# Patient Record
Sex: Male | Born: 1980 | Race: White | Hispanic: No | Marital: Single | Smoking: Former smoker
Health system: Southern US, Community
[De-identification: ages and names within clinical notes are randomized; demographics above are authoritative.]

## PROBLEM LIST (undated history)

## (undated) DIAGNOSIS — F1010 Alcohol abuse, uncomplicated: Secondary | ICD-10-CM

## (undated) HISTORY — PX: BACK SURGERY: SHX140

---

## 2002-03-05 ENCOUNTER — Encounter: Admission: RE | Admit: 2002-03-05 | Discharge: 2002-03-05 | Payer: Self-pay | Admitting: Neurosurgery

## 2002-03-05 ENCOUNTER — Encounter: Payer: Self-pay | Admitting: Neurosurgery

## 2002-03-19 ENCOUNTER — Encounter: Payer: Self-pay | Admitting: Neurosurgery

## 2002-03-19 ENCOUNTER — Encounter: Admission: RE | Admit: 2002-03-19 | Discharge: 2002-03-19 | Payer: Self-pay | Admitting: Neurosurgery

## 2002-04-02 ENCOUNTER — Encounter: Payer: Self-pay | Admitting: Neurosurgery

## 2002-04-02 ENCOUNTER — Encounter: Admission: RE | Admit: 2002-04-02 | Discharge: 2002-04-02 | Payer: Self-pay | Admitting: Neurosurgery

## 2002-05-15 ENCOUNTER — Encounter: Payer: Self-pay | Admitting: Neurosurgery

## 2002-05-17 ENCOUNTER — Inpatient Hospital Stay (HOSPITAL_COMMUNITY): Admission: RE | Admit: 2002-05-17 | Discharge: 2002-05-18 | Payer: Self-pay | Admitting: Neurosurgery

## 2002-05-17 ENCOUNTER — Encounter: Payer: Self-pay | Admitting: Neurosurgery

## 2008-05-29 ENCOUNTER — Encounter: Payer: Self-pay | Admitting: Internal Medicine

## 2008-06-10 ENCOUNTER — Ambulatory Visit: Payer: Self-pay | Admitting: Internal Medicine

## 2008-06-10 DIAGNOSIS — R0989 Other specified symptoms and signs involving the circulatory and respiratory systems: Secondary | ICD-10-CM

## 2008-06-10 DIAGNOSIS — R0609 Other forms of dyspnea: Secondary | ICD-10-CM

## 2008-06-12 LAB — CONVERTED CEMR LAB
Basophils Absolute: 0 10*3/uL (ref 0.0–0.1)
Basophils Relative: 0.3 % (ref 0.0–1.0)
Eosinophils Absolute: 0.2 10*3/uL (ref 0.0–0.7)
Eosinophils Relative: 2 % (ref 0.0–5.0)
HCT: 46 % (ref 39.0–52.0)
Hemoglobin: 16.4 g/dL (ref 13.0–17.0)
Lymphocytes Relative: 26.9 % (ref 12.0–46.0)
MCHC: 35.6 g/dL (ref 30.0–36.0)
MCV: 96.7 fL (ref 78.0–100.0)
Monocytes Absolute: 0.7 10*3/uL (ref 0.1–1.0)
Monocytes Relative: 7.6 % (ref 3.0–12.0)
Neutro Abs: 6 10*3/uL (ref 1.4–7.7)
Neutrophils Relative %: 63.2 % (ref 43.0–77.0)
Platelets: 210 10*3/uL (ref 150–400)
RBC: 4.76 M/uL (ref 4.22–5.81)
RDW: 12.5 % (ref 11.5–14.6)
TSH: 1.88 microintl units/mL (ref 0.35–5.50)
WBC: 9.5 10*3/uL (ref 4.5–10.5)

## 2008-06-19 ENCOUNTER — Encounter (INDEPENDENT_AMBULATORY_CARE_PROVIDER_SITE_OTHER): Payer: Self-pay | Admitting: *Deleted

## 2011-05-13 NOTE — H&P (Signed)
[place]  Patient:    Harold Howard, Harold Howard Visit Number: [id number] MRN: [id number]          Service Type: SUR Location: [id number] [id number] Attending Physician:  Emeterio Reeve Dictated by:   Payton Doughty, M.D. Admit Date:  05/17/2002 Discharge Date: 05/18/2002                           History and Physical  ADMITTING DIAGNOSIS: Herniated disk, L5-S1.  HISTORY OF PRESENT ILLNESS: This is a 30 year old right-handed white gentleman, who had a disk a couple of years ago eccentric to the right at 5-1 and did not get anything done.  While mixing cement he had increased back pain.  He had degenerative disk disease, central disk at 4-5 and right-sided disk at 5-1.  He complains of back and right leg pain.  He underwent epidural steroids, which did not help him much.  Restudying him he has a central disk at 4-5, slightly eccentric to the right, but at L5-S1 he has a rather prominent disk that elevates the right S1 nerve root, and he is now admitted for 5-1 diskectomy.  PAST MEDICAL HISTORY: Otherwise unremarkable.  MEDICATIONS: He does not use any medication.  ALLERGIES: He has no allergies.  PAST SURGICAL HISTORY: No operations.  FAMILY HISTORY: Mom is 88 and in good health.  Dad is 69 and in good health.  SOCIAL HISTORY: He does not smoke or drink.  He is a Engineer, production.  REVIEW OF SYSTEMS: Remarkable for back and right leg pain.  PHYSICAL EXAMINATION:  HEENT: Within normal limits.  NECK: He has reasonable range of motion of the neck.  CHEST: Clear.  CARDIAC: Regular rate and rhythm.  ABDOMEN: Nontender.  No hepatosplenomegaly.  EXTREMITIES: Without clubbing or cyanosis.  Peripheral pulses good.  GU: Examination deferred.  NEUROLOGIC: He is awake, alert, and oriented.  Cranial nerves intact.  Motor examination shows 5/5 strength throughout the upper and lower extremities except for some mild dorsiflexion weakness on the right  side.  He has a very strongly positive straight-leg-raise on the right side.  Reflexes are 2 at the knees, 2 at the left ankle, absent at the right ankle.  LABORATORY DATA: He also had discography which during which the right S1 transforaminal injection got a strong concordant response.  CLINICAL IMPRESSION: Right S1 radiculopathy secondary to herniated disk. Although he does have a disk at 4-5 I believe his symptomatic disk is at 5-1.  PLAN: The plan is for lumbar laminectomy and diskectomy at L5-S1.  The risks and benefits of this approach have been discussed with him and he wishes to proceed. Dictated by:   Payton Doughty, M.D. Attending Physician:  Emeterio Reeve DD:  05/17/02 TD:  05/19/02 Job: [id number]

## 2011-05-13 NOTE — Op Note (Signed)
[place]  Patient:    Harold, Howard Visit Number: [id number] MRN: [id number]          Service Type: SUR Location: 3000 3025 01 Attending Physician:  Emeterio Reeve Proc. Date: 05/17/02 Admit Date:  05/17/2002 Discharge Date: 05/18/2002                             Operative Report  PREOPERATIVE DIAGNOSIS:  Herniated disk on the right L5-S1.  POSTOPERATIVE DIAGNOSIS:  Herniated on the right side L5-S1.  PROCEDURE PERFORMED:  Right L5-S1 laminectomy and diskectomy.  SURGEON:  Payton Doughty, M.D.  ASSISTANTLandry Mellow.  ANESTHESIA:  General endotracheal.  PREP:  Sterile prep with alcohol wipe.  COMPLICATIONS:  None.  NURSE ASSISTANT:  [place].  INDICATION:  This is a 30 year old right-handed gentleman with a right S1 radiculopathy and herniated disk at L5-S1.  DESCRIPTION OF PROCEDURE:  He is taken to the operating room and smoothly intubated.  He was placed prone on the operating table.  He was prepped and draped in the usual sterile fashion.  The skin was infiltrated with 1% lidocaine with 1:400,000 epinephrine.  The skin was incised from mid L4 to mid S1.  The lumen of L4 and L5 were exposed on the right side in a periosteal plane.  Intraoperative x-ray showed a marker at L4.  A laminectomy was carried out.  Hemilaminectomy of L5 was carried out at the top of the ligamentum flavum that was removed in a retrograde fashion.  The right S1 nerve root was identified and carefully dissected free and immediately entered, was identified a large herniated disk which as a free fragment was removed without difficulty.  The annular tear was then carefully explored and all graspable fragments were removed.  The wound was irrigated and hemostasis assured.  The nerve root was carefully explored in all quadrants to make sure it was free. Depo-Medrol soaked fat was then placed in the laminectomy defect.  The fascia was reapproximated with 0  Vicryl in an interrupted fashion.  The subcutaneous tissue was reapproximated with 0 Vicryl in an interrupted fashion and the subcuticular tissues were approximated with 3-0 Vicryl in an interrupted fashion.  The skin was closed with 4-0 Vicryl in a running subcuticular fashion.  Benzoin and Steri-Strips were placed made occlusive with Telfa and Op-Site.  The patient was returned to the recovery room in good condition. Attending Physician:  Emeterio Reeve DD:  05/17/02 TD:  05/21/02 Job: [id number] [id number]

## 2018-11-15 ENCOUNTER — Encounter (HOSPITAL_COMMUNITY): Payer: Self-pay | Admitting: Emergency Medicine

## 2018-11-15 ENCOUNTER — Emergency Department (HOSPITAL_COMMUNITY): Payer: 59

## 2018-11-15 ENCOUNTER — Emergency Department (HOSPITAL_COMMUNITY)
Admission: EM | Admit: 2018-11-15 | Discharge: 2018-11-15 | Disposition: A | Discharge status: Home / Self Care | Payer: 59 | Attending: Emergency Medicine | Admitting: Emergency Medicine

## 2018-11-15 ENCOUNTER — Other Ambulatory Visit: Payer: Self-pay

## 2018-11-15 DIAGNOSIS — Y939 Activity, unspecified: Secondary | ICD-10-CM | POA: Insufficient documentation

## 2018-11-15 DIAGNOSIS — Y929 Unspecified place or not applicable: Secondary | ICD-10-CM | POA: Insufficient documentation

## 2018-11-15 DIAGNOSIS — S99921A Unspecified injury of right foot, initial encounter: Secondary | ICD-10-CM | POA: Diagnosis present

## 2018-11-15 DIAGNOSIS — S9031XA Contusion of right foot, initial encounter: Secondary | ICD-10-CM | POA: Insufficient documentation

## 2018-11-15 DIAGNOSIS — W228XXA Striking against or struck by other objects, initial encounter: Secondary | ICD-10-CM | POA: Insufficient documentation

## 2018-11-15 DIAGNOSIS — Y998 Other external cause status: Secondary | ICD-10-CM | POA: Diagnosis not present

## 2018-11-15 NOTE — ED Triage Notes (Signed)
Pt from home with c/o pain in right foot following falling after kicking a chair and falling. Pt buddy taped affected toes at home. Pt reports pain 4/10. Pt also wants a toe on his left foot assessed. He states "the toe has been dislocated since 2010"

## 2018-11-15 NOTE — ED Provider Notes (Addendum)
[facility]-EMERGENCY DEPT Provider Note   CSN: [id number] Arrival date & time: 11/15/18  2207     History   Chief Complaint Chief Complaint  Patient presents with  . Foot Pain    HPI Harold Howard is a 37 y.o. male.  HPI  37 year old male complaining of right foot pain since kicking a chair 2 nights ago.  He states that there is some swelling and bruising on the great toe and the second toe.  Pain has been moderate.  Pain is improved by elevating it but not walking on it.  Pain is worsened with walking on it.  History reviewed. No pertinent past medical history.  Patient Active Problem List   Diagnosis Date Noted  . DYSPNEA 06/10/2008    History reviewed. No pertinent surgical history.      Home Medications    Prior to Admission medications   Not on File    Family History No family history on file.  Social History Social History   Tobacco Use  . Smoking status: Not on file  Substance Use Topics  . Alcohol use: Not on file  . Drug use: Not on file     Allergies   Patient has no known allergies.   Review of Systems Review of Systems  All other systems reviewed and are negative.    Physical Exam Updated Vital Signs BP (!) 150/99 (BP Location: Left Arm)   Pulse 88   Temp 97.9 F (36.6 C) (Oral)   Resp 12   Ht 1.778 m (5\' 10" )   Wt 74.1 kg   SpO2 100%   BMI 23.45 kg/m   Physical Exam  Constitutional: He is oriented to person, place, and time. He appears well-developed and well-nourished.  HENT:  Head: Normocephalic and atraumatic.  Right Ear: External ear normal.  Left Ear: External ear normal.  Nose: Nose normal.  Eyes: EOM are normal.  Neck: No tracheal deviation present.  Pulmonary/Chest: Effort normal.  Musculoskeletal:       Feet:  Some swelling and tenderness over right great toe and second toe.  Neurological: He is alert and oriented to person, place, and time.  Skin: Skin is warm and dry.    Psychiatric: He has a normal mood and affect. His behavior is normal.  Nursing note and vitals reviewed.    ED Treatments / Results  Labs (all labs ordered are listed, but only abnormal results are displayed) Labs Reviewed - No data to display  EKG None  Radiology Dg Foot Complete Right  Result Date: 11/15/2018 CLINICAL DATA:  Patient kicked a chair on 11 [id number] injuring the foot and bruising the medial side and over the first and second metatarsals. EXAM: RIGHT FOOT COMPLETE - 3+ VIEW COMPARISON:  None. FINDINGS: There is 25 degrees of hallux valgus of the great toe. No acute fracture or joint dislocation is identified. Tiny plantar and dorsal calcaneal enthesophytes are present. No significant soft tissue swelling. No radiopaque foreign body. IMPRESSION: 1. No acute fracture or joint dislocation. 2. Hallux valgus. 3. No significant soft tissue swelling. Electronically Signed   By: Tollie Eth M.D.   On: 11/15/2018 23:17    Procedures Procedures (including critical care time)  Medications Ordered in ED Medications - No data to display   Initial Impression / Assessment and Plan / ED Course  I have reviewed the triage vital signs and the nursing notes.  Pertinent labs & imaging results that were available during my care of  the patient were reviewed by me and considered in my medical decision making (see chart for details).      Final Clinical Impressions(s) / ED Diagnoses   Final diagnoses:  Contusion of right foot, initial encounter    ED Discharge Orders    None       Margarita Grizzleay, Suleika Donavan, MD 11/15/18 [id number]    Margarita Grizzleay, Kaleb Sek, MD 11/15/18 2328

## 2018-11-15 NOTE — Discharge Instructions (Addendum)
Continue to use boot Elevate foot and use cold therapy as much as possible

## 2018-11-15 NOTE — ED Notes (Signed)
Patient transported to X-ray 

## 2022-02-13 ENCOUNTER — Inpatient Hospital Stay (HOSPITAL_COMMUNITY): Payer: Self-pay

## 2022-02-13 ENCOUNTER — Emergency Department (HOSPITAL_COMMUNITY): Payer: Self-pay

## 2022-02-13 ENCOUNTER — Encounter (HOSPITAL_COMMUNITY): Payer: Self-pay | Admitting: Anesthesiology

## 2022-02-13 ENCOUNTER — Other Ambulatory Visit: Payer: Self-pay

## 2022-02-13 ENCOUNTER — Encounter (HOSPITAL_COMMUNITY): Payer: Self-pay

## 2022-02-13 ENCOUNTER — Inpatient Hospital Stay (HOSPITAL_COMMUNITY)
Admission: EM | Admit: 2022-02-13 | Discharge: 2022-03-26 | DRG: 405 | Disposition: E | Payer: Self-pay | Attending: Internal Medicine | Admitting: Internal Medicine

## 2022-02-13 ENCOUNTER — Encounter (HOSPITAL_COMMUNITY): Admission: EM | Disposition: E | Payer: Self-pay | Source: Home / Self Care | Attending: Internal Medicine

## 2022-02-13 DIAGNOSIS — F419 Anxiety disorder, unspecified: Secondary | ICD-10-CM | POA: Diagnosis present

## 2022-02-13 DIAGNOSIS — J9601 Acute respiratory failure with hypoxia: Secondary | ICD-10-CM | POA: Diagnosis present

## 2022-02-13 DIAGNOSIS — I81 Portal vein thrombosis: Secondary | ICD-10-CM | POA: Diagnosis present

## 2022-02-13 DIAGNOSIS — Z978 Presence of other specified devices: Secondary | ICD-10-CM

## 2022-02-13 DIAGNOSIS — F1010 Alcohol abuse, uncomplicated: Secondary | ICD-10-CM

## 2022-02-13 DIAGNOSIS — E8809 Other disorders of plasma-protein metabolism, not elsewhere classified: Secondary | ICD-10-CM | POA: Diagnosis present

## 2022-02-13 DIAGNOSIS — Z6372 Alcoholism and drug addiction in family: Secondary | ICD-10-CM

## 2022-02-13 DIAGNOSIS — I8501 Esophageal varices with bleeding: Secondary | ICD-10-CM

## 2022-02-13 DIAGNOSIS — E8770 Fluid overload, unspecified: Secondary | ICD-10-CM | POA: Diagnosis not present

## 2022-02-13 DIAGNOSIS — R569 Unspecified convulsions: Secondary | ICD-10-CM | POA: Diagnosis present

## 2022-02-13 DIAGNOSIS — Z87891 Personal history of nicotine dependence: Secondary | ICD-10-CM

## 2022-02-13 DIAGNOSIS — R195 Other fecal abnormalities: Secondary | ICD-10-CM | POA: Diagnosis present

## 2022-02-13 DIAGNOSIS — F05 Delirium due to known physiological condition: Secondary | ICD-10-CM | POA: Diagnosis not present

## 2022-02-13 DIAGNOSIS — R41843 Psychomotor deficit: Secondary | ICD-10-CM | POA: Diagnosis present

## 2022-02-13 DIAGNOSIS — K7040 Alcoholic hepatic failure without coma: Secondary | ICD-10-CM | POA: Diagnosis present

## 2022-02-13 DIAGNOSIS — R188 Other ascites: Secondary | ICD-10-CM

## 2022-02-13 DIAGNOSIS — Z452 Encounter for adjustment and management of vascular access device: Secondary | ICD-10-CM

## 2022-02-13 DIAGNOSIS — E870 Hyperosmolality and hypernatremia: Secondary | ICD-10-CM | POA: Diagnosis not present

## 2022-02-13 DIAGNOSIS — E871 Hypo-osmolality and hyponatremia: Secondary | ICD-10-CM | POA: Diagnosis not present

## 2022-02-13 DIAGNOSIS — J690 Pneumonitis due to inhalation of food and vomit: Secondary | ICD-10-CM | POA: Diagnosis not present

## 2022-02-13 DIAGNOSIS — Z66 Do not resuscitate: Secondary | ICD-10-CM | POA: Diagnosis not present

## 2022-02-13 DIAGNOSIS — D62 Acute posthemorrhagic anemia: Secondary | ICD-10-CM | POA: Diagnosis present

## 2022-02-13 DIAGNOSIS — F10239 Alcohol dependence with withdrawal, unspecified: Secondary | ICD-10-CM | POA: Diagnosis present

## 2022-02-13 DIAGNOSIS — K7031 Alcoholic cirrhosis of liver with ascites: Principal | ICD-10-CM

## 2022-02-13 DIAGNOSIS — F32A Depression, unspecified: Secondary | ICD-10-CM | POA: Diagnosis present

## 2022-02-13 DIAGNOSIS — Z634 Disappearance and death of family member: Secondary | ICD-10-CM

## 2022-02-13 DIAGNOSIS — K7682 Hepatic encephalopathy: Secondary | ICD-10-CM | POA: Diagnosis present

## 2022-02-13 DIAGNOSIS — K7011 Alcoholic hepatitis with ascites: Secondary | ICD-10-CM | POA: Diagnosis present

## 2022-02-13 DIAGNOSIS — E440 Moderate protein-calorie malnutrition: Secondary | ICD-10-CM | POA: Insufficient documentation

## 2022-02-13 DIAGNOSIS — K2100 Gastro-esophageal reflux disease with esophagitis, without bleeding: Secondary | ICD-10-CM | POA: Diagnosis present

## 2022-02-13 DIAGNOSIS — J95851 Ventilator associated pneumonia: Secondary | ICD-10-CM | POA: Diagnosis not present

## 2022-02-13 DIAGNOSIS — Z811 Family history of alcohol abuse and dependence: Secondary | ICD-10-CM

## 2022-02-13 DIAGNOSIS — Z515 Encounter for palliative care: Secondary | ICD-10-CM

## 2022-02-13 DIAGNOSIS — I8511 Secondary esophageal varices with bleeding: Secondary | ICD-10-CM | POA: Diagnosis present

## 2022-02-13 DIAGNOSIS — R578 Other shock: Secondary | ICD-10-CM | POA: Diagnosis present

## 2022-02-13 DIAGNOSIS — R9431 Abnormal electrocardiogram [ECG] [EKG]: Secondary | ICD-10-CM | POA: Diagnosis present

## 2022-02-13 DIAGNOSIS — Z9911 Dependence on respirator [ventilator] status: Secondary | ICD-10-CM

## 2022-02-13 DIAGNOSIS — K3189 Other diseases of stomach and duodenum: Secondary | ICD-10-CM | POA: Diagnosis present

## 2022-02-13 DIAGNOSIS — E876 Hypokalemia: Secondary | ICD-10-CM | POA: Diagnosis present

## 2022-02-13 DIAGNOSIS — K7460 Unspecified cirrhosis of liver: Secondary | ICD-10-CM

## 2022-02-13 DIAGNOSIS — K766 Portal hypertension: Secondary | ICD-10-CM | POA: Diagnosis present

## 2022-02-13 DIAGNOSIS — Z6826 Body mass index (BMI) 26.0-26.9, adult: Secondary | ICD-10-CM

## 2022-02-13 DIAGNOSIS — Z931 Gastrostomy status: Secondary | ICD-10-CM

## 2022-02-13 DIAGNOSIS — T82528A Displacement of other cardiac and vascular devices and implants, initial encounter: Secondary | ICD-10-CM

## 2022-02-13 DIAGNOSIS — Y848 Other medical procedures as the cause of abnormal reaction of the patient, or of later complication, without mention of misadventure at the time of the procedure: Secondary | ICD-10-CM | POA: Diagnosis not present

## 2022-02-13 DIAGNOSIS — D696 Thrombocytopenia, unspecified: Secondary | ICD-10-CM | POA: Diagnosis present

## 2022-02-13 DIAGNOSIS — E875 Hyperkalemia: Secondary | ICD-10-CM | POA: Diagnosis not present

## 2022-02-13 DIAGNOSIS — N5089 Other specified disorders of the male genital organs: Secondary | ICD-10-CM | POA: Diagnosis present

## 2022-02-13 DIAGNOSIS — Z800 Family history of malignant neoplasm of digestive organs: Secondary | ICD-10-CM

## 2022-02-13 DIAGNOSIS — E872 Acidosis, unspecified: Secondary | ICD-10-CM | POA: Diagnosis present

## 2022-02-13 DIAGNOSIS — J9690 Respiratory failure, unspecified, unspecified whether with hypoxia or hypercapnia: Secondary | ICD-10-CM

## 2022-02-13 DIAGNOSIS — R600 Localized edema: Secondary | ICD-10-CM | POA: Diagnosis present

## 2022-02-13 DIAGNOSIS — Z638 Other specified problems related to primary support group: Secondary | ICD-10-CM

## 2022-02-13 DIAGNOSIS — R571 Hypovolemic shock: Secondary | ICD-10-CM

## 2022-02-13 DIAGNOSIS — K922 Gastrointestinal hemorrhage, unspecified: Secondary | ICD-10-CM

## 2022-02-13 DIAGNOSIS — N179 Acute kidney failure, unspecified: Secondary | ICD-10-CM | POA: Diagnosis present

## 2022-02-13 DIAGNOSIS — G9341 Metabolic encephalopathy: Secondary | ICD-10-CM | POA: Diagnosis present

## 2022-02-13 DIAGNOSIS — F1020 Alcohol dependence, uncomplicated: Secondary | ICD-10-CM | POA: Diagnosis present

## 2022-02-13 DIAGNOSIS — D684 Acquired coagulation factor deficiency: Secondary | ICD-10-CM | POA: Diagnosis present

## 2022-02-13 DIAGNOSIS — Z20822 Contact with and (suspected) exposure to covid-19: Secondary | ICD-10-CM | POA: Diagnosis present

## 2022-02-13 HISTORY — PX: ESOPHAGOGASTRODUODENOSCOPY: SHX5428

## 2022-02-13 HISTORY — PX: ESOPHAGEAL BANDING: SHX5518

## 2022-02-13 HISTORY — DX: Alcohol abuse, uncomplicated: F10.10

## 2022-02-13 LAB — I-STAT ARTERIAL BLOOD GAS, ED
Acid-base deficit: 22 mmol/L — ABNORMAL HIGH (ref 0.0–2.0)
Bicarbonate: 9.2 mmol/L — ABNORMAL LOW (ref 20.0–28.0)
Calcium, Ion: 0.99 mmol/L — ABNORMAL LOW (ref 1.15–1.40)
HCT: 30 % — ABNORMAL LOW (ref 39.0–52.0)
Hemoglobin: 10.2 g/dL — ABNORMAL LOW (ref 13.0–17.0)
O2 Saturation: 100 %
Patient temperature: 96.7
Potassium: 3.9 mmol/L (ref 3.5–5.1)
Sodium: 137 mmol/L (ref 135–145)
TCO2: 10 mmol/L — ABNORMAL LOW (ref 22–32)
pCO2 arterial: 37.9 mmHg (ref 32–48)
pH, Arterial: 6.985 — CL (ref 7.35–7.45)
pO2, Arterial: 461 mmHg — ABNORMAL HIGH (ref 83–108)

## 2022-02-13 LAB — CBC
HCT: 9.1 % — ABNORMAL LOW (ref 39.0–52.0)
HCT: 24.3 % — ABNORMAL LOW (ref 39.0–52.0)
HCT: 23.6 % — ABNORMAL LOW (ref 39.0–52.0)
Hemoglobin: 1.9 g/dL — CL (ref 13.0–17.0)
Hemoglobin: 8.2 g/dL — ABNORMAL LOW (ref 13.0–17.0)
Hemoglobin: 7.9 g/dL — ABNORMAL LOW (ref 13.0–17.0)
MCH: 22.9 pg — ABNORMAL LOW (ref 26.0–34.0)
MCH: 29.4 pg (ref 26.0–34.0)
MCH: 28.7 pg (ref 26.0–34.0)
MCHC: 20.9 g/dL — ABNORMAL LOW (ref 30.0–36.0)
MCHC: 33.7 g/dL (ref 30.0–36.0)
MCHC: 33.5 g/dL (ref 30.0–36.0)
MCV: 109.6 fL — ABNORMAL HIGH (ref 80.0–100.0)
MCV: 87.1 fL (ref 80.0–100.0)
MCV: 85.8 fL (ref 80.0–100.0)
Platelets: 143 10*3/uL — ABNORMAL LOW (ref 150–400)
Platelets: UNDETERMINED 10*3/uL (ref 150–400)
Platelets: 66 10*3/uL — ABNORMAL LOW (ref 150–400)
RBC: 0.83 MIL/uL — ABNORMAL LOW (ref 4.22–5.81)
RBC: 2.79 MIL/uL — ABNORMAL LOW (ref 4.22–5.81)
RBC: 2.75 MIL/uL — ABNORMAL LOW (ref 4.22–5.81)
RDW: 27.6 % — ABNORMAL HIGH (ref 11.5–15.5)
RDW: 15.8 % — ABNORMAL HIGH (ref 11.5–15.5)
RDW: 15.8 % — ABNORMAL HIGH (ref 11.5–15.5)
WBC: 16.7 10*3/uL — ABNORMAL HIGH (ref 4.0–10.5)
WBC: 20 10*3/uL — ABNORMAL HIGH (ref 4.0–10.5)
WBC: 18.3 10*3/uL — ABNORMAL HIGH (ref 4.0–10.5)
nRBC: 0.4 % — ABNORMAL HIGH (ref 0.0–0.2)
nRBC: 0.2 % (ref 0.0–0.2)
nRBC: 0.4 % — ABNORMAL HIGH (ref 0.0–0.2)

## 2022-02-13 LAB — POCT I-STAT 7, (LYTES, BLD GAS, ICA,H+H)
Acid-base deficit: 17 mmol/L — ABNORMAL HIGH (ref 0.0–2.0)
Acid-base deficit: 8 mmol/L — ABNORMAL HIGH (ref 0.0–2.0)
Bicarbonate: 9.1 mmol/L — ABNORMAL LOW (ref 20.0–28.0)
Bicarbonate: 14.9 mmol/L — ABNORMAL LOW (ref 20.0–28.0)
Calcium, Ion: 1.01 mmol/L — ABNORMAL LOW (ref 1.15–1.40)
Calcium, Ion: 1.05 mmol/L — ABNORMAL LOW (ref 1.15–1.40)
HCT: 28 % — ABNORMAL LOW (ref 39.0–52.0)
HCT: 23 % — ABNORMAL LOW (ref 39.0–52.0)
Hemoglobin: 9.5 g/dL — ABNORMAL LOW (ref 13.0–17.0)
Hemoglobin: 7.8 g/dL — ABNORMAL LOW (ref 13.0–17.0)
O2 Saturation: 99 %
O2 Saturation: 99 %
Patient temperature: 34.5
Patient temperature: 36.2
Potassium: 3.6 mmol/L (ref 3.5–5.1)
Potassium: 3.2 mmol/L — ABNORMAL LOW (ref 3.5–5.1)
Sodium: 137 mmol/L (ref 135–145)
Sodium: 140 mmol/L (ref 135–145)
TCO2: 10 mmol/L — ABNORMAL LOW (ref 22–32)
TCO2: 16 mmol/L — ABNORMAL LOW (ref 22–32)
pCO2 arterial: 19.9 mmHg — CL (ref 32–48)
pCO2 arterial: 21 mmHg — ABNORMAL LOW (ref 32–48)
pH, Arterial: 7.254 — ABNORMAL LOW (ref 7.35–7.45)
pH, Arterial: 7.456 — ABNORMAL HIGH (ref 7.35–7.45)
pO2, Arterial: 125 mmHg — ABNORMAL HIGH (ref 83–108)
pO2, Arterial: 115 mmHg — ABNORMAL HIGH (ref 83–108)

## 2022-02-13 LAB — URINALYSIS, ROUTINE W REFLEX MICROSCOPIC
Bilirubin Urine: NEGATIVE
Glucose, UA: NEGATIVE mg/dL
Ketones, ur: NEGATIVE mg/dL
Leukocytes,Ua: NEGATIVE
Nitrite: NEGATIVE
Protein, ur: NEGATIVE mg/dL
Specific Gravity, Urine: 1.02 (ref 1.005–1.030)
pH: 6 (ref 5.0–8.0)

## 2022-02-13 LAB — MAGNESIUM: Magnesium: 1.6 mg/dL — ABNORMAL LOW (ref 1.7–2.4)

## 2022-02-13 LAB — DIC (DISSEMINATED INTRAVASCULAR COAGULATION)PANEL
D-Dimer, Quant: 6.18 ug/mL-FEU — ABNORMAL HIGH (ref 0.00–0.50)
Fibrinogen: 174 mg/dL — ABNORMAL LOW (ref 210–475)
INR: 2 — ABNORMAL HIGH (ref 0.8–1.2)
Platelets: UNDETERMINED 10*3/uL (ref 150–400)
Prothrombin Time: 22.4 seconds — ABNORMAL HIGH (ref 11.4–15.2)
Smear Review: NONE SEEN
aPTT: 39 seconds — ABNORMAL HIGH (ref 24–36)

## 2022-02-13 LAB — COMPREHENSIVE METABOLIC PANEL
ALT: <5 U/L (ref 0–44)
AST: 86 U/L — ABNORMAL HIGH (ref 15–41)
Albumin: 1.6 g/dL — ABNORMAL LOW (ref 3.5–5.0)
Alkaline Phosphatase: 80 U/L (ref 38–126)
BUN: 29 mg/dL — ABNORMAL HIGH (ref 6–20)
CO2: <7 mmol/L — ABNORMAL LOW (ref 22–32)
Calcium: 8.2 mg/dL — ABNORMAL LOW (ref 8.9–10.3)
Chloride: 100 mmol/L (ref 98–111)
Creatinine, Ser: 1.86 mg/dL — ABNORMAL HIGH (ref 0.61–1.24)
GFR, Estimated: 46 mL/min — ABNORMAL LOW (ref >60)
Glucose, Bld: 111 mg/dL — ABNORMAL HIGH (ref 70–99)
Potassium: 4.6 mmol/L (ref 3.5–5.1)
Sodium: 136 mmol/L (ref 135–145)
Total Bilirubin: 4.1 mg/dL — ABNORMAL HIGH (ref 0.3–1.2)
Total Protein: 4.1 g/dL — ABNORMAL LOW (ref 6.5–8.1)

## 2022-02-13 LAB — ABO/RH: ABO/RH(D): O POS

## 2022-02-13 LAB — PHOSPHORUS: Phosphorus: 4.8 mg/dL — ABNORMAL HIGH (ref 2.5–4.6)

## 2022-02-13 LAB — LACTIC ACID, PLASMA
Lactic Acid, Venous: >9 mmol/L (ref 0.5–1.9)
Lactic Acid, Venous: >9 mmol/L (ref 0.5–1.9)

## 2022-02-13 LAB — URINALYSIS, MICROSCOPIC (REFLEX)

## 2022-02-13 LAB — CBG MONITORING, ED: Glucose-Capillary: 96 mg/dL (ref 70–99)

## 2022-02-13 LAB — GLUCOSE, CAPILLARY
Glucose-Capillary: 121 mg/dL — ABNORMAL HIGH (ref 70–99)
Glucose-Capillary: 127 mg/dL — ABNORMAL HIGH (ref 70–99)
Glucose-Capillary: 128 mg/dL — ABNORMAL HIGH (ref 70–99)

## 2022-02-13 LAB — HIV ANTIBODY (ROUTINE TESTING W REFLEX): HIV Screen 4th Generation wRfx: NONREACTIVE

## 2022-02-13 LAB — RESP PANEL BY RT-PCR (FLU A&B, COVID) ARPGX2
Influenza A by PCR: NEGATIVE
Influenza B by PCR: NEGATIVE
SARS Coronavirus 2 by RT PCR: NEGATIVE

## 2022-02-13 LAB — TROPONIN I (HIGH SENSITIVITY)
Troponin I (High Sensitivity): 68 ng/L — ABNORMAL HIGH (ref <18)
Troponin I (High Sensitivity): 198 ng/L (ref <18)

## 2022-02-13 LAB — LACTATE DEHYDROGENASE: LDH: 365 U/L — ABNORMAL HIGH (ref 98–192)

## 2022-02-13 LAB — HEPATITIS A ANTIBODY, TOTAL: hep A Total Ab: REACTIVE — AB

## 2022-02-13 LAB — PROTIME-INR
INR: 1.9 — ABNORMAL HIGH (ref 0.8–1.2)
Prothrombin Time: 21.9 seconds — ABNORMAL HIGH (ref 11.4–15.2)

## 2022-02-13 LAB — PREPARE RBC (CROSSMATCH)

## 2022-02-13 LAB — AMMONIA: Ammonia: 144 umol/L — ABNORMAL HIGH (ref 9–35)

## 2022-02-13 LAB — HEPATITIS C ANTIBODY: HCV Ab: NONREACTIVE

## 2022-02-13 LAB — POC OCCULT BLOOD, ED: Fecal Occult Bld: POSITIVE — AB

## 2022-02-13 LAB — MRSA NEXT GEN BY PCR, NASAL: MRSA by PCR Next Gen: NOT DETECTED

## 2022-02-13 LAB — HEPATITIS B SURFACE ANTIGEN: Hepatitis B Surface Ag: NONREACTIVE

## 2022-02-13 SURGERY — EGD (ESOPHAGOGASTRODUODENOSCOPY)
Anesthesia: Choice

## 2022-02-13 MED ORDER — SODIUM CHLORIDE 0.9 % IV SOLN
50.0000 ug/h | INTRAVENOUS | Status: AC
  Administered 2022-02-13 – 2022-02-16 (×7): 50 ug/h via INTRAVENOUS
  Filled 2022-02-13 (×8): qty 1

## 2022-02-13 MED ORDER — LORAZEPAM 2 MG/ML IJ SOLN: 2.0000 mg | INTRAMUSCULAR | Status: DC | PRN

## 2022-02-13 MED ORDER — MIDAZOLAM HCL 2 MG/2ML IJ SOLN
2.0000 mg | INTRAMUSCULAR | Status: DC | PRN
  Filled 2022-02-13 (×5): qty 2

## 2022-02-13 MED ORDER — PHENYLEPHRINE 40 MCG/ML (10ML) SYRINGE FOR IV PUSH (FOR BLOOD PRESSURE SUPPORT)
160.0000 ug | PREFILLED_SYRINGE | Freq: Once | INTRAVENOUS | Status: DC | PRN
Start: 2022-02-13 — End: 2022-02-14

## 2022-02-13 MED ORDER — CALCIUM CHLORIDE 10 % IV SOLN
INTRAVENOUS | Status: AC
  Filled 2022-02-13: qty 10

## 2022-02-13 MED ORDER — CHLORHEXIDINE GLUCONATE CLOTH 2 % EX PADS
6.0000 | MEDICATED_PAD | Freq: Every day | CUTANEOUS | Status: DC
  Administered 2022-02-15 – 2022-02-17 (×4): 6 via TOPICAL

## 2022-02-13 MED ORDER — ETOMIDATE 2 MG/ML IV SOLN
INTRAVENOUS | Status: AC | PRN
  Administered 2022-02-13: 20 mg via INTRAVENOUS

## 2022-02-13 MED ORDER — POLYETHYLENE GLYCOL 3350 17 G PO PACK: 17.0000 g | PACK | Freq: Every day | ORAL | Status: DC

## 2022-02-13 MED ORDER — FENTANYL 2500MCG IN NS 250ML (10MCG/ML) PREMIX INFUSION
50.0000 ug/h | INTRAVENOUS | Status: DC
  Administered 2022-02-13: 50 ug/h via INTRAVENOUS
  Administered 2022-02-13: 200 ug/h via INTRAVENOUS
  Administered 2022-02-14: 100 ug/h via INTRAVENOUS
  Filled 2022-02-13 (×3): qty 250

## 2022-02-13 MED ORDER — OCTREOTIDE LOAD VIA INFUSION
50.0000 ug | Freq: Once | INTRAVENOUS | Status: DC
  Filled 2022-02-13: qty 25

## 2022-02-13 MED ORDER — FOLIC ACID 1 MG PO TABS: 1.0000 mg | ORAL_TABLET | Freq: Every day | ORAL | Status: DC

## 2022-02-13 MED ORDER — CALCIUM CHLORIDE 10 % IV SOLN
INTRAVENOUS | Status: DC | PRN
Start: 2022-02-13 — End: 2022-02-14
  Administered 2022-02-13: 1 g via INTRAVENOUS

## 2022-02-13 MED ORDER — SODIUM BICARBONATE 8.4 % IV SOLN
100.0000 meq | Freq: Once | INTRAVENOUS | Status: AC
  Administered 2022-02-13: 100 meq via INTRAVENOUS
  Filled 2022-02-13: qty 100

## 2022-02-13 MED ORDER — SODIUM CHLORIDE 0.9 % IV SOLN
10.0000 mL/h | Freq: Once | INTRAVENOUS | Status: DC
Start: 2022-02-13 — End: 2022-02-14

## 2022-02-13 MED ORDER — CEFTRIAXONE SODIUM 2 G IJ SOLR
2.0000 g | INTRAMUSCULAR | Status: DC
  Administered 2022-02-13: 2 g via INTRAVENOUS
  Filled 2022-02-13 (×2): qty 20

## 2022-02-13 MED ORDER — SODIUM CHLORIDE 0.9% IV SOLUTION: Freq: Once | INTRAVENOUS | Status: DC

## 2022-02-13 MED ORDER — CALCIUM GLUCONATE-NACL 1-0.675 GM/50ML-% IV SOLN: 1.0000 g | Freq: Once | INTRAVENOUS | Status: DC

## 2022-02-13 MED ORDER — CALCIUM GLUCONATE-NACL 2-0.675 GM/100ML-% IV SOLN
2.0000 g | Freq: Once | INTRAVENOUS | Status: AC
  Administered 2022-02-13: 2000 mg via INTRAVENOUS
  Filled 2022-02-13: qty 100

## 2022-02-13 MED ORDER — ORAL CARE MOUTH RINSE
15.0000 mL | OROMUCOSAL | Status: DC
  Administered 2022-02-13 – 2022-02-16 (×27): 15 mL via OROMUCOSAL

## 2022-02-13 MED ORDER — PROPOFOL 1000 MG/100ML IV EMUL: 0.0000 ug/kg/min | INTRAVENOUS | Status: DC

## 2022-02-13 MED ORDER — PHENYLEPHRINE HCL-NACL 20-0.9 MG/250ML-% IV SOLN
INTRAVENOUS | Status: AC
  Administered 2022-02-13: 20 ug/min via INTRAVENOUS
  Filled 2022-02-13: qty 250

## 2022-02-13 MED ORDER — STERILE WATER FOR INJECTION IV SOLN
INTRAVENOUS | Status: DC
  Filled 2022-02-13: qty 1000

## 2022-02-13 MED ORDER — MIDAZOLAM HCL 2 MG/2ML IJ SOLN
2.0000 mg | INTRAMUSCULAR | Status: DC | PRN
  Administered 2022-02-13 (×3): 2 mg via INTRAVENOUS

## 2022-02-13 MED ORDER — POLYETHYLENE GLYCOL 3350 17 G PO PACK: 17.0000 g | PACK | Freq: Every day | ORAL | Status: DC | PRN

## 2022-02-13 MED ORDER — PANTOPRAZOLE SODIUM 40 MG IV SOLR: 40.0000 mg | Freq: Two times a day (BID) | INTRAVENOUS | Status: DC

## 2022-02-13 MED ORDER — TRANEXAMIC ACID-NACL 1000-0.7 MG/100ML-% IV SOLN
1000.0000 mg | INTRAVENOUS | Status: AC
  Administered 2022-02-13: 1000 mg via INTRAVENOUS
  Filled 2022-02-13: qty 100

## 2022-02-13 MED ORDER — DOCUSATE SODIUM 50 MG/5ML PO LIQD: 100.0000 mg | Freq: Two times a day (BID) | ORAL | Status: DC

## 2022-02-13 MED ORDER — FOLIC ACID 1 MG PO TABS
1.0000 mg | ORAL_TABLET | Freq: Every day | ORAL | Status: DC
Start: 2022-02-13 — End: 2022-02-13

## 2022-02-13 MED ORDER — THIAMINE HCL 100 MG/ML IJ SOLN: 100.0000 mg | Freq: Every day | INTRAMUSCULAR | Status: DC

## 2022-02-13 MED ORDER — CHLORHEXIDINE GLUCONATE 0.12% ORAL RINSE (MEDLINE KIT)
15.0000 mL | Freq: Two times a day (BID) | OROMUCOSAL | Status: DC
  Administered 2022-02-13 – 2022-02-18 (×9): 15 mL via OROMUCOSAL

## 2022-02-13 MED ORDER — PANTOPRAZOLE 80MG IVPB - SIMPLE MED
80.0000 mg | Freq: Once | INTRAVENOUS | Status: AC
  Administered 2022-02-13: 80 mg via INTRAVENOUS
  Filled 2022-02-13: qty 80

## 2022-02-13 MED ORDER — DOCUSATE SODIUM 100 MG PO CAPS: 100.0000 mg | ORAL_CAPSULE | Freq: Two times a day (BID) | ORAL | Status: DC | PRN

## 2022-02-13 MED ORDER — MIDAZOLAM HCL 2 MG/2ML IJ SOLN
2.0000 mg | INTRAMUSCULAR | Status: AC | PRN
  Administered 2022-02-13 (×3): 2 mg via INTRAVENOUS
  Filled 2022-02-13 (×2): qty 2

## 2022-02-13 MED ORDER — ROCURONIUM BROMIDE 50 MG/5ML IV SOLN
100.0000 mg | Freq: Once | INTRAVENOUS | Status: DC | PRN
  Filled 2022-02-13: qty 10

## 2022-02-13 MED ORDER — ALBUTEROL SULFATE (2.5 MG/3ML) 0.083% IN NEBU: 2.5000 mg | INHALATION_SOLUTION | RESPIRATORY_TRACT | Status: DC | PRN

## 2022-02-13 MED ORDER — ROCURONIUM BROMIDE 10 MG/ML (PF) SYRINGE
PREFILLED_SYRINGE | INTRAVENOUS | Status: AC
  Filled 2022-02-13: qty 10

## 2022-02-13 MED ORDER — THIAMINE HCL 100 MG PO TABS: 100.0000 mg | ORAL_TABLET | Freq: Every day | ORAL | Status: DC

## 2022-02-13 MED ORDER — ADULT MULTIVITAMIN LIQUID CH
15.0000 mL | Freq: Every day | ORAL | Status: DC
  Filled 2022-02-13 (×2): qty 15

## 2022-02-13 MED ORDER — FOLIC ACID 5 MG/ML IJ SOLN
1.0000 mg | Freq: Every day | INTRAMUSCULAR | Status: DC
  Filled 2022-02-13: qty 0.2

## 2022-02-13 MED ORDER — SODIUM CHLORIDE 0.9 % IV SOLN
10.0000 mL/h | Freq: Once | INTRAVENOUS | Status: AC
  Administered 2022-02-14: 10 mL/h via INTRAVENOUS

## 2022-02-13 MED ORDER — ETOMIDATE 2 MG/ML IV SOLN
INTRAVENOUS | Status: AC
  Filled 2022-02-13: qty 20

## 2022-02-13 MED ORDER — FENTANYL NICU BOLUS VIA INFUSION
50.0000 ug | INTRAVENOUS | Status: DC | PRN
  Filled 2022-02-13: qty 10

## 2022-02-13 MED ORDER — FENTANYL BOLUS VIA INFUSION
50.0000 ug | INTRAVENOUS | Status: DC | PRN
  Administered 2022-02-13: 50 ug via INTRAVENOUS
  Administered 2022-02-14: 100 ug via INTRAVENOUS
  Administered 2022-02-15: 50 ug via INTRAVENOUS
  Filled 2022-02-13: qty 100

## 2022-02-13 MED ORDER — ROCURONIUM BROMIDE 50 MG/5ML IV SOLN
INTRAVENOUS | Status: AC | PRN
  Administered 2022-02-13: 70 mg via INTRAVENOUS

## 2022-02-13 MED ORDER — PHENYLEPHRINE HCL-NACL 20-0.9 MG/250ML-% IV SOLN
0.0000 ug/min | INTRAVENOUS | Status: DC
  Administered 2022-02-13 (×2): 100 ug/min via INTRAVENOUS
  Administered 2022-02-14: 70 ug/min via INTRAVENOUS
  Administered 2022-02-14 (×2): 40 ug/min via INTRAVENOUS
  Filled 2022-02-13 (×6): qty 250

## 2022-02-13 MED ORDER — PANTOPRAZOLE INFUSION (NEW) - SIMPLE MED
8.0000 mg/h | INTRAVENOUS | Status: DC
  Administered 2022-02-13 – 2022-02-14 (×4): 8 mg/h via INTRAVENOUS
  Filled 2022-02-13 (×3): qty 80

## 2022-02-13 NOTE — H&P (Signed)
NAME:  Harold Howard, MRN:  [id number], DOB:  06-Nov-1981, LOS: 0 ADMISSION DATE:  01/29/2022, CONSULTATION DATE:  02/16/2022 REFERRING MD:  Gustavus Messing, CHIEF COMPLAINT:  hemorrhagic shock   History of Present Illness:  Harold Howard is a 41 y/o gentleman with a history of daily ETOH use who presents with 1 month of vomiting blood and dark stools. History is provided by the ED and his step sister as he is intubated and sedated. His family is estranged due to his ETOH consumption. His step-sister indicated she is the only family he is in contact with. He drinks about a fifth per day, but has been working on cutting back recently, but he still drinks about a half gallon of liquor every 2-3 days. He came home from work early yesterday after texting his roommate a picture of a toilet bowl full of BRB. He was found on the floor this morning screaming to call 911. No emesis with EMS. Received 500cc crystalloid and zofran. He was hypotensive and developed worsening encephalopathy in the ED and required intubation. He had a seizure in the ED.   He received 8 pRBCs. PCCM consulted for admission. GI consulted by the ED.  No known use of NSAIDs or anticoagulants. His step-sister is not clear on his medical history.  Pertinent  Medical History  ETOH abuse  Significant Hospital Events: Including procedures, antibiotic start and stop dates in addition to other pertinent events   2/19 admission  Interim History / Subjective:    Objective   Blood pressure (!) 114/52, pulse (!) 109, temperature (!) 96.7 F (35.9 C), temperature source Axillary, resp. rate 18, height 5\' 11"  (1.803 m), weight 76 kg, SpO2 100 %.    Vent Mode: PRVC FiO2 (%):  [40 %-100 %] 40 % Set Rate:  [18 bmp] 18 bmp Vt Set:  [600 mL] 600 mL PEEP:  [5 cmH20] 5 cmH20 Plateau Pressure:  [16 cmH20] 16 cmH20   Intake/Output Summary (Last 24 hours) at 01/30/2022 1336 Last data filed at 02/15/2022 1156 Gross per 24 hour  Intake --  Output 900  ml  Net -900 ml   Filed Weights   02/09/2022 0958  Weight: 76 kg    Examination: General: critically ill appearing man intubated, sedated HENT: [place]/AT, eyes anicteric, eyes anicteric Lungs: CTAB, synchronous with MV Cardiovascular: S1S2, tachycardic Abdomen: soft, NT, no hepatomegaly Extremities: no peripheral edema, no cyanosis or clubbing Neuro: RASS -5, PERRL Derm: no telangiectasias or spider angioma, no bruising  BUN 29 Cr 1.86 T bili 4.1 WBC 16.7 H/H 1.9/9.1  Resolved Hospital Problem list     Assessment & Plan:  Hemorrhagic shock, acute on subacute GIB-- suspect upper source. -give 4 cryo, FFP, platelets; can recheck coags and CBC after -2g Ca+ now -transfuse to maintain Hb >7 and hemodynamic stability -agree with octreotide -pantoprazole -needs invasive IV access and A-line to assist with resuscitation -appreciate GI's assistance -recommend ETOH cessation; can counsel when appropriate -hold chemical DVT prophylaxis  ETOH abuse -recommend cessation -vitamins when able to take PO -monitor for withdrawal  Acute respiratory failure with hypoxia -LTVV, 4-8cc/kg IBW with goal Pplat<30 and DP<!% -VAP prevention protocol -PAD protocol for sedation -daily SAT & SBT when stable  Severe metabolic acidosis due to shock -hyperventilate -monitor -may need bicarb gtt if not correcting -check serial LA  Hypocalcemia -replete -monitor  Hyperbilirubinemia and elevated transaminase due to hypoperfusion, ETOH abuse. High risk for cirrhosis -RUQ 02/15/22 -maintain adequate perfusion  AKI -maintain adequate perfusion -strict  I/Os -renally dose meds, avoid nephrotoxic meds  Seizure, likely due to profound hypoperfusion and hypoxia -head CT -if recurrent, add keppra -ativan PRN -seizure precautions   Best Practice (right click and "Reselect all SmartList Selections" daily)   Diet/type: NPO DVT prophylaxis: SCD GI prophylaxis: PPI Lines: Central line Foley:   Yes, and it is still needed Code Status:  full code Last date of multidisciplinary goals of care discussion [ with step sister on 2/19]  Labs   CBC: Recent Labs  Lab 02/03/2022 1055 02/20/2022 1315  WBC 16.7*  --   HGB 1.9* 10.2*  HCT 9.1* 30.0*  MCV 109.6*  --   PLT 143*  --     Basic Metabolic Panel: Recent Labs  Lab 02/07/2022 1135 02/18/2022 1315  NA 136 137  K 4.6 3.9  CL 100  --   CO2 <7*  --   GLUCOSE 111*  --   BUN 29*  --   CREATININE 1.86*  --   CALCIUM 8.2*  --    GFR: Estimated Creatinine Clearance: 56.2 mL/min (A) (by C-G formula based on SCr of 1.86 mg/dL (H)). Recent Labs  Lab 02/12/2022 1055  WBC 16.7*    Liver Function Tests: Recent Labs  Lab 02/11/2022 1135  AST 86*  ALT <5  ALKPHOS 80  BILITOT 4.1*  PROT 4.1*  ALBUMIN 1.6*   No results for input(s): LIPASE, AMYLASE in the last 168 hours. No results for input(s): AMMONIA in the last 168 hours.  ABG    Component Value Date/Time   PHART 6.985 (LL) 02/20/2022 1315   PCO2ART 37.9 02/14/2022 1315   PO2ART 461 (H) 02/19/2022 1315   HCO3 9.2 (L) 02/12/2022 1315   TCO2 10 (L) 02/04/2022 1315   ACIDBASEDEF 22.0 (H) 02/01/2022 1315   O2SAT 100 02/12/2022 1315     Coagulation Profile: No results for input(s): INR, PROTIME in the last 168 hours.  Cardiac Enzymes: No results for input(s): CKTOTAL, CKMB, CKMBINDEX, TROPONINI in the last 168 hours.  HbA1C: No results found for: HGBA1C  CBG: Recent Labs  Lab 02/04/2022 1054  GLUCAP 96    Review of Systems:   Unable to be obtained due to mental status.  Past Medical History:  He,  has a past medical history of ETOH abuse.   Surgical History:   Past Surgical History:  Procedure Laterality Date   BACK SURGERY       Social History:   reports that he has quit smoking. His smoking use included cigarettes. He has never used smokeless tobacco. He reports current alcohol use. He reports that he does not use drugs.   Family History:  His  family history includes Alcoholism in his mother.   Allergies No Known Allergies   Home Medications  Prior to Admission medications   Not on File     Critical care time: 60  min.      Steffanie Dunn, DO 01/27/2022 1:43 PM [facility]

## 2022-02-13 NOTE — Op Note (Signed)
[facility] Patient Name: Harold Howard Procedure Date : 01/30/2022 MRN: [id number] Attending MD: Willaim Rayas. Adela Lank , MD Date of Birth: [birth year] CSN: [id number] Age: 41 Admit Type: Inpatient Procedure:                Upper GI endoscopy Indications:              Hematemesis / severe upper GI bleed - Hgb of 1.9 in                            the ED, rapid transfusion protocol (8 units) for                            resuscitation, hemodynamics improved. history of                            alcoholism, suspected cirrhosis. Patient intubated                            in the ICU for this procedure. Providers:                Willaim Rayas. Adela Lank, MD, Glory Rosebush, RN, Rozetta Nunnery, Technician Referring MD:              Medicines:                Versed and Fentanyl administered per nursing at                            bedside Complications:            No immediate complications. Estimated blood loss:                            Minimal. Estimated Blood Loss:     Estimated blood loss was minimal. Procedure:                Pre-Anesthesia Assessment:                           - Prior to the procedure, a History and Physical                            was performed, and patient medications and                            allergies were reviewed. The patient's tolerance of                            previous anesthesia was also reviewed. The risks                            and benefits of the procedure and the sedation  options and risks were discussed with the patient.                            All questions were answered, and informed consent                            was obtained. Prior Anticoagulants: The patient has                            taken no previous anticoagulant or antiplatelet                            agents. ASA Grade Assessment: IV - A patient with                            severe systemic  disease that is a constant threat                            to life. After reviewing the risks and benefits,                            the patient was deemed in satisfactory condition to                            undergo the procedure.                           After obtaining informed consent, the endoscope was                            passed under direct vision. Throughout the                            procedure, the patient's blood pressure, pulse, and                            oxygen saturations were monitored continuously. The                            [id number]-[id number] ([id number] Olympus endoscope was introduced                            through the mouth, and advanced to the second part                            of duodenum. The upper GI endoscopy was                            accomplished without difficulty. The patient                            tolerated the procedure well. Findings:      Multiple columns of large (> 5 mm) varices with stigmata of recent  bleeding were found in the entire esophagus. They were large in size,       especially in distal esophagus. They were quite friable. After examining       the stomach and duodenum and clearing those areas for other source of       bleeding, we started to band the varices, distally moving proximally.       During this process, after a few bands were well-placed in the distal       esophagus, one of the varix proximal to a band started spurting blood.       Visualization was impaired for a few minutes but I could eventually       identify the site of the bleeding varix and it was subsequently banded       with hemostasis. Eleven bands were successfully placed in the lower to       mid eosphagus leading to decompression of the varices, but not complete       resolution, 2 misfired. Bleeding had stopped at the end of the procedure.      Esophagitis with no bleeding was found 42 cm from the incisors.      Moderate portal  hypertensive gastropathy was found in the entire       examined stomach.      The fundus was edematous, and suspected varices noted in the cardia       without stigmata of bleeding. The exam of the stomach was otherwise       normal. Residual blood noted in the stomach with clot.      The duodenal bulb and second portion of the duodenum were normal. 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 Banded x 11 with good result                           - Reflux esophagitis                           - Portal hypertensive gastropathy.                           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                           Overall, patient has had esophageal variceal                            bleeding causing this presentation, assumption of  underlying cirrhosis which needs to be evaluated.                            Very large varices, at risk for rebleeding.                           Of note, no photos available for this exam                            (computer / software issues with travel cart) Recommendation:           - Remain in the ICU for ongoing care.                           - NPO.                           - Continue present medications.                           - IV octreotide                           - IV protonix                           - Serial Hgb, transfuse as needed for goal Hgb of 7                           - If patient has rebleeding, contact IR for                            consideration for TIPS                           - Cefriaxone for SBP prophylaxis                           - Alcohol abstinence, monitor for withdrawal                           - GI service will continue to follow, see note for                             details of full recommendations Procedure Code(s):        --- Professional ---                           [phone], Esophagogastroduodenoscopy, flexible,                            transoral; with band ligation of esophageal/gastric                            varices Diagnosis Code(s):        --- Professional ---  I85.01, Esophageal varices with bleeding                           K21.00, Gastro-esophageal reflux disease with                            esophagitis, without bleeding                           K76.6, Portal hypertension                           K31.89, Other diseases of stomach and duodenum                           K92.0, Hematemesis CPT copyright 2019 American Medical Association. All rights reserved. The codes documented in this report are preliminary and upon coder review may  be revised to meet current compliance requirements. Viviann SpareSteven P. Jiles Goya, MD 02/10/2022 4:23:57 PM This report has been signed electronically. Number of Addenda: 0

## 2022-02-13 NOTE — Procedures (Signed)
Arterial Catheter Insertion Procedure Note  Harold Howard  RD:[id number]  18-May-1981  Date:02/17/2022  Time:1:38 PM    Provider Performing: Kennieth Rad    Procedure: Insertion of Arterial Line [phone]) with US guidance [id number])   Indication(s) Blood pressure monitoring and/or need for frequent ABGs  Consent Unable to obtain consent due to emergent nature of procedure.  Anesthesia Lido locally, sedation with fentanyl gtt, prn versed    Time Out Verified patient identification, verified procedure, site/side was marked, verified correct patient position, special equipment/implants available, medications/allergies/relevant history reviewed, required imaging and test results available.   Sterile Technique Maximal sterile technique including full sterile barrier drape, hand hygiene, sterile gown, sterile gloves, mask, hair covering, sterile ultrasound probe cover (if used).   Procedure Description Area of catheter insertion was cleaned with chlorhexidine and draped in sterile fashion. With real-time ultrasound guidance an arterial catheter was placed into the right femoral artery.  Appropriate arterial tracings confirmed on monitor.     Complications/Tolerance None; patient tolerated the procedure well.   EBL Minimal   Specimen(s) None     Kennieth Rad, ACNP [facility] 02/07/2022, 1:39 PM  See Amion for pager If no response to pager, please call PCCM consult pager After 7:00 pm call Elink

## 2022-02-13 NOTE — Consult Note (Signed)
[facility] Consult: 12:12 PM 02/20/2022  LOS: 0 days    Referring Provider: Dr. Dina Rich in ED Primary Care Physician:  Patient, No Pcp Per (Inactive) Primary Gastroenterologist:  unassigned  Step sister and closest relative is [place].  Her dtrs cell phone # is [phone], reach her there.       Reason for Consultation:  acute GI bleed, Hgb 1.9   HPI: Harold Howard is a 41 y.o. male.  PMH obtained reviewing records.  Unable to obtain much of a history from patient now that he is intubated.  Herniated discs lS spine in 2003.    Reported to triage around 10 AM this morning said he had been vomiting and having diarrhea for 1 to 2 months.  Story varies as to when he started having dark, bloody stools and emesis.  Is stepped sister says this started a few weeks ago, other notes mention this started late last week.  This morning he woke his girlfriend up and said call 911. Stepsister described heavy alcohol use of 1/5 of spirits daily.  Recently patient has tried to cut back but his last known consumption not determined.  At arrival had blood pressures 120s/95 but within 2 hours these had dropped to 73/25, heart rate has been steadily in the low 100s to 1 teens.  Currently pressures 130s/low 50s heart rate 100.  Became altered, had tonic, clonic activity of upper extremities lasting about 4 minutes and unresponsive afterwards.  Was intubated to protect airway.  Bloody emesis observed at time of intubation.  About 1.5 L of dark material have suctioned out of his stomach after placement of OG tube.  Hgb 1.9.  Hematocrit 9.1.  Platelets 143.  WBC 16.7 BUN/creatinine 29/1.8, GFR 46.  Sodium 136.  Normal potassium. Albumin 1.6.  T. bili 4.1.  Alk phos 80.  AST/ALT 86/<5.  INR pndg.     Family history of  stomach cancer which caused his mother to die at about age 8.  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    Past Surgical History:  Procedure Laterality Date   BACK SURGERY      Prior to Admission medications   Not on File    Scheduled Meds:  etomidate       rocuronium bromide       Infusions:  sodium chloride     sodium chloride     fentaNYL infusion INTRAVENOUS 50 mcg/hr (02/18/2022 1151)   PRN Meds: fentaNYL, phenylephrine   Allergies as of 02/05/2022   (No Known Allergies)    History reviewed. No pertinent family history.  Social History   Socioeconomic History   Marital status: Single    Spouse name: Not on file   Number of children: Not on file   Years of education: Not on file   Highest education level: Not on file  Occupational History   Not on file  Tobacco Use   Smoking status: Former    Types: Cigarettes   Smokeless tobacco: Never  Vaping Use   Vaping Use: Never used  Substance and Sexual Activity   Alcohol use: Not on file    Comment: states very little   Drug use: Never   Sexual activity: Not on file  Other Topics Concern   Not on file  Social History Narrative   Not on file   Social Determinants of Health   Financial Resource Strain: Not on file  Food Insecurity: Not on file  Transportation Needs: Not on file  Physical Activity: Not on file  Stress: Not on file  Social Connections: Not on file  Intimate Partner Violence: Not on file    REVIEW OF SYSTEMS: See HPI for any details gleaned from chart and from speaking with stepsister.  Unable to obtain review of systems from patient as he is intubated, sedated   PHYSICAL EXAM: Vital signs in last 24 hours: Vitals:   02/14/2022 1155 02/20/2022 1200  BP: (!) 95/48 (!) 94/55  Pulse: (!) 107 (!) 105  Resp: 18 18  Temp:    SpO2: 100% 100%   Wt Readings from Last 3 Encounters:  02/02/2022 76 kg  11/15/18 74.1 kg    General: Patient looks ill,  pale/pasty.  Unresponsive on vent. Head: No signs of head trauma Eyes: Conjunctiva pink. Ears: Not able to assess hearing Nose: No discharge Mouth: ET tube in place.  OG tube with dark emesis Neck: JVD on right. Lungs: No labored breathing on the vent.  Lungs clear to auscultation in front. Heart: Regular, tacky in low 100s. Abdomen: Soft without distention.  No organomegaly, bruits, hernias appreciated..   Rectal: Not performed.  RN confirms patient has not had any bowel movements since arrival in the ED. Musc/Skeltl: No joint redness, swelling or gross deformity. Extremities: No CCE. Neurologic: Sedated.  Not currently moving limbs.  No involuntary movement, seizure activity. Skin: Pale. Tattoos: None observed but not a complete survey of his skin. Nodes: No cervical adenopathy Psych: Sedated.  Intake/Output from previous day: No intake/output data recorded. Intake/Output this shift: Total I/O In: -  Out: 900 [Emesis/NG output:900]  LAB RESULTS: Recent Labs    01/30/2022 1055  WBC 16.7*  HGB 1.9*  HCT 9.1*  PLT 143*   BMET No results found for: NA, K, CL, CO2, GLUCOSE, BUN, CREATININE, CALCIUM LFT No results for input(s): PROT, ALBUMIN, AST, ALT, ALKPHOS, BILITOT, BILIDIR, IBILI in the last 72 hours. PT/INR No results found for: INR, PROTIME Hepatitis Panel No results for input(s): HEPBSAG, HCVAB, HEPAIGM, HEPBIGM in the last 72 hours. C-Diff No components found for: CDIFF Lipase  No results found for: LIPASE  Drugs of Abuse  No results found for: LABOPIA, COCAINSCRNUR, LABBENZ, AMPHETMU, THCU, LABBARB   RADIOLOGY STUDIES: DG Chest Port 1 View  Result Date: 02/20/2022 CLINICAL DATA:  Post intubation EXAM: PORTABLE CHEST 1 VIEW COMPARISON:  None. FINDINGS: Endotracheal tube tip is 3.2 cm above the carina. Enteric tube tip in the stomach. Cardiomediastinal silhouette within normal limits. No focal consolidation, pleural effusion or pneumothorax. IMPRESSION: Medical  devices as described.  No acute process identified. Electronically Signed   By: Ofilia Neas M.D.   On: 02/08/2022 12:07      IMPRESSION:      Profound normocytic anemia.  Macrocytic.  GI bleed with dark gastric contents.  At least 1 month of nausea and vomiting.  When the passing of dark, bloody emesis and stool started in question, could have been a few weeks ago or just since Thursday, 4 days ago.  Massive transfusion protocol initiated.  So far has received 8 PRBCs, FFP x2, platelets x1.     Seizure activity followed by altered mental status, intubated for airway protection.  ? ETOH withdrawal seizure?     ETOH use disorder.  Elevated AST and t bili, normal alk phos, ALT.  Hypoalbuminemic.  No ETOH level.      Thrombocytopenia, non-critical at 143.      AKI.  No baseline renal function for comparison.    PLAN:       Need to collect blood for post transfusion CBC, INR.    EGD today.  Dr. Havery Moros aware.  Have obtained signed consent from his stepsister.  She understands the plan and that we are still running tests to figure out what is going on with her brother.      Needs PPI (already ordered )and octreotide drip.  I just placed order for octreotide    Check hepatitis serologies.     Azucena Freed  02/12/2022, 12:12 PM Phone [phone]

## 2022-02-13 NOTE — Progress Notes (Signed)
Patient transported to CT and then to 3M06 without complications. RN at bedside.

## 2022-02-13 NOTE — Procedures (Signed)
Central Venous Catheter Insertion Procedure Note  Harold Howard  [id number]  01-04-1981  Date:Feb 24, 2022  Time:1:36 PM   Provider Performing:Brooke Lodema Hong   Procedure: Insertion of Non-tunneled Central Venous Catheter(36556) with US guidance (40973)   Indication(s) Medication administration  Consent Unable to obtain consent due to emergent nature of procedure.  Anesthesia Topical only with 1% lidocaine   Timeout Verified patient identification, verified procedure, site/side was marked, verified correct patient position, special equipment/implants available, medications/allergies/relevant history reviewed, required imaging and test results available.  Sterile Technique Maximal sterile technique including full sterile barrier drape, hand hygiene, sterile gown, sterile gloves, mask, hair covering, sterile ultrasound probe cover (if used).  Procedure Description Area of catheter insertion was cleaned with chlorhexidine and draped in sterile fashion.  With real-time ultrasound guidance a central venous catheter was placed into the right internal jugular vein. Nonpulsatile blood flow and easy flushing noted in all ports.  The catheter was sutured in place and sterile dressing applied.  Blood culture sent from sterile procedure  Complications/Tolerance None; patient tolerated the procedure well. Chest X-ray is ordered to verify placement for internal jugular or subclavian cannulation.   Chest x-ray is not ordered for femoral cannulation.  EBL Minimal  Specimen(s) None     Posey Boyer, ACNP [facility] 02/24/22, 1:38 PM  See Amion for pager If no response to pager, please call PCCM consult pager After 7:00 pm call Elink

## 2022-02-13 NOTE — Progress Notes (Signed)
RR increased to 35 per MD. ABG ordered for one hour after rate change.

## 2022-02-13 NOTE — ED Notes (Signed)
Lab called and reports hemoglobin 2.1 but needs recollect.

## 2022-02-13 NOTE — ED Notes (Addendum)
Pt wretching as if to vomit, sat  pt up slightly with emesis bag, stepped to summon MD came back in room to find patient in a tonic/ conic activty with upper extremities. MD into see patient in a post ictal state. Seizure like activity last ed app. 3 -4 minutes. Pt. Now unresposive following seizure.

## 2022-02-13 NOTE — Sedation Documentation (Signed)
[place] in 160

## 2022-02-13 NOTE — ED Notes (Signed)
[place] infusing @130 /min

## 2022-02-13 NOTE — Plan of Care (Signed)
°  Problem: Safety: Goal: Non-violent Restraint(s) Outcome: Progressing   Problem: Activity: Goal: Ability to tolerate increased activity will improve Outcome: Progressing   Problem: Respiratory: Goal: Ability to maintain a clear airway and adequate ventilation will improve Outcome: Progressing   Problem: Role Relationship: Goal: Method of communication will improve Outcome: Progressing   Problem: Education: Goal: Ability to identify signs and symptoms of gastrointestinal bleeding will improve Outcome: Progressing   Problem: Bowel/Gastric: Goal: Will show no signs and symptoms of gastrointestinal bleeding Outcome: Progressing   Problem: Fluid Volume: Goal: Will show no signs and symptoms of excessive bleeding Outcome: Progressing   Problem: Clinical Measurements: Goal: Complications related to the disease process, condition or treatment will be avoided or minimized Outcome: Progressing

## 2022-02-13 NOTE — ED Provider Notes (Addendum)
[facility] EMERGENCY DEPARTMENT Provider Note   CSN: [id number] Arrival date & time: 02/08/2022  0947     History  Chief Complaint  Patient presents with   Near Syncope   Emesis   Diarrhea    Harold Howard is a 41 y.o. male.  HPI  41 year old male with no known past medical history presents the emergency department by EMS with concern for nausea/vomiting/diarrhea.  On my initial evaluation patient is hypotensive, altered mental status.  Poor historian secondary to unstable hemodynamics/acuity.  Report from EMS is that he received a small fluid bolus with Zofran but there was no active emesis in route.  No family at bedside.  Level 5 caveat due to AMS/acuity.  Home Medications Prior to Admission medications   Not on File      Allergies    Patient has no known allergies.    Review of Systems   Review of Systems  Unable to perform ROS: Mental status change   Physical Exam Updated Vital Signs BP 126/64    Pulse (!) 101    Temp (!) 96.7 F (35.9 C) (Axillary)    Resp 18    Ht 5' 11"  (1.803 m)    Wt 76 kg    SpO2 100%    BMI 23.37 kg/m  Physical Exam Vitals and nursing note reviewed.  Constitutional:      General: He is in acute distress.     Appearance: He is ill-appearing, toxic-appearing and diaphoretic.  HENT:     Head: Normocephalic.     Mouth/Throat:     Mouth: Mucous membranes are moist.  Cardiovascular:     Rate and Rhythm: Tachycardia present.  Pulmonary:     Effort: Pulmonary effort is normal.  Abdominal:     General: There is no distension.     Palpations: Abdomen is soft.     Tenderness: There is no guarding.  Skin:    Coloration: Skin is pale.  Neurological:     Mental Status: He is oriented to person, place, and time.    ED Results / Procedures / Treatments   Labs (all labs ordered are listed, but only abnormal results are displayed) Labs Reviewed  CBC - Abnormal; Notable for the following components:      Result Value    WBC 16.7 (*)    RBC 0.83 (*)    Hemoglobin 1.9 (*)    HCT 9.1 (*)    MCV 109.6 (*)    MCH 22.9 (*)    MCHC 20.9 (*)    RDW 27.6 (*)    Platelets 143 (*)    nRBC 0.4 (*)    All other components within normal limits  COMPREHENSIVE METABOLIC PANEL - Abnormal; Notable for the following components:   CO2 <7 (*)    Glucose, Bld 111 (*)    BUN 29 (*)    Creatinine, Ser 1.86 (*)    Calcium 8.2 (*)    Total Protein 4.1 (*)    Albumin 1.6 (*)    AST 86 (*)    Total Bilirubin 4.1 (*)    GFR, Estimated 46 (*)    All other components within normal limits  POC OCCULT BLOOD, ED - Abnormal; Notable for the following components:   Fecal Occult Bld POSITIVE (*)    All other components within normal limits  RESP PANEL BY RT-PCR (FLU A&B, COVID) ARPGX2  BLOOD GAS, ARTERIAL  MAGNESIUM  PHOSPHORUS  PROTIME-INR  AMMONIA  LACTIC ACID,  PLASMA  LACTIC ACID, PLASMA  LACTATE DEHYDROGENASE  PATHOLOGIST SMEAR REVIEW  HAPTOGLOBIN  HIV ANTIBODY (ROUTINE TESTING W REFLEX)  URINALYSIS, ROUTINE W REFLEX MICROSCOPIC  HEMOGLOBIN AND HEMATOCRIT, BLOOD  HEMOGLOBIN AND HEMATOCRIT, BLOOD  BLOOD GAS, ARTERIAL  CBG MONITORING, ED  TYPE AND SCREEN  PREPARE RBC (CROSSMATCH)  ABO/RH  PREPARE RBC (CROSSMATCH)  PREPARE PLATELET PHERESIS  PREPARE FRESH FROZEN PLASMA  PREPARE CRYOPRECIPITATE  PREPARE CRYOPRECIPITATE  TROPONIN I (HIGH SENSITIVITY)    EKG EKG Interpretation  Date/Time:  Sunday February 13 2022 10:02:52 EST Ventricular Rate:  112 PR Interval:  121 QRS Duration: 111 QT Interval:  398 QTC Calculation: 544 R Axis:   -60 Text Interpretation: Sinus tachycardia Left anterior fascicular block Low voltage, extremity leads Abnormal R-wave progression, early transition Prolonged QT interval Confirmed by Lavenia Atlas [phone]) on 02/17/2022 10:17:02 AM  Radiology DG Chest Port 1 View  Result Date: 02/11/2022 CLINICAL DATA:  Post intubation EXAM: PORTABLE CHEST 1 VIEW COMPARISON:  None. FINDINGS:  Endotracheal tube tip is 3.2 cm above the carina. Enteric tube tip in the stomach. Cardiomediastinal silhouette within normal limits. No focal consolidation, pleural effusion or pneumothorax. IMPRESSION: Medical devices as described.  No acute process identified. Electronically Signed   By: Ofilia Neas M.D.   On: 02/15/2022 12:07    Procedures Procedure Name: Intubation Date/Time: 02/02/2022 12:43 PM Performed by: Lorelle Gibbs, DO Pre-anesthesia Checklist: Patient identified, Patient being monitored, Emergency Drugs available, Timeout performed and Suction available Oxygen Delivery Method: Non-rebreather mask Preoxygenation: Pre-oxygenation with 100% oxygen Induction Type: Rapid sequence Ventilation: Mask ventilation without difficulty Tube size: 7.0 mm Number of attempts: 1 Placement Confirmation: ETT inserted through vocal cords under direct vision, CO2 detector and Breath sounds checked- equal and bilateral    .Critical Care Performed by: Lorelle Gibbs, DO Authorized by: Lorelle Gibbs, DO   Critical care provider statement:    Critical care time (minutes):  75   Critical care time was exclusive of:  Separately billable procedures and treating other patients   Critical care was necessary to treat or prevent imminent or life-threatening deterioration of the following conditions:  Respiratory failure, shock, metabolic crisis and circulatory failure   Critical care was time spent personally by me on the following activities:  Development of treatment plan with patient or surrogate, discussions with consultants, evaluation of patient's response to treatment, examination of patient, ordering and review of laboratory studies, ordering and review of radiographic studies, ordering and performing treatments and interventions, pulse oximetry, re-evaluation of patient's condition and review of old charts   I assumed direction of critical care for this patient from another provider  in my specialty: no     Care discussed with: admitting provider      Medications Ordered in ED Medications  0.9 %  sodium chloride infusion (has no administration in time range)  fentaNYL 2540mg in NS 2515m(1071mml) infusion-PREMIX (50 mcg/hr Intravenous New Bag/Given 01/26/2022 1151)  fentaNYL (SUBLIMAZE) bolus via infusion 50-100 mcg (has no administration in time range)  rocuronium bromide 100 MG/10ML SOSY (has no administration in time range)  etomidate (AMIDATE) 2 MG/ML injection (has no administration in time range)  0.9 %  sodium chloride infusion (has no administration in time range)  PHENYLephrine 40 mcg/ml in normal saline Adult IV Push Syringe (For Blood Pressure Support) (has no administration in time range)  0.9 %  sodium chloride infusion (Manually program via Guardrails IV Fluids) (has no administration in time range)  pantoprazole (PROTONIX)  80 mg /NS 100 mL IVPB (has no administration in time range)  pantoprozole (PROTONIX) 80 mg /NS 100 mL infusion (has no administration in time range)  pantoprazole (PROTONIX) injection 40 mg (has no administration in time range)  chlorhexidine gluconate (MEDLINE KIT) (PERIDEX) 0.12 % solution 15 mL (has no administration in time range)  MEDLINE mouth rinse (has no administration in time range)  albuterol (PROVENTIL) (2.5 MG/3ML) 0.083% nebulizer solution 2.5 mg (has no administration in time range)  midazolam (VERSED) injection 2 mg (has no administration in time range)  midazolam (VERSED) injection 2 mg (has no administration in time range)  thiamine (B-1) injection 100 mg (has no administration in time range)  folic acid injection 1 mg (has no administration in time range)  calcium gluconate 1 g/ 50 mL sodium chloride IVPB (has no administration in time range)  0.9 %  sodium chloride infusion (Manually program via Guardrails IV Fluids) (has no administration in time range)  calcium chloride 10 % injection (has no administration in time  range)  calcium chloride injection (1 g Intravenous Given 01/26/2022 1231)  etomidate (AMIDATE) injection (20 mg Intravenous Given 02/16/2022 1141)  rocuronium (ZEMURON) injection (70 mg Intravenous Given 02/02/2022 1142)    ED Course/ Medical Decision Making/ A&P                           Medical Decision Making Amount and/or Complexity of Data Reviewed Labs: ordered. Radiology: ordered.  Risk Prescription drug management. Decision regarding hospitalization.   41 year old male presents emergency department hypotensive with altered mental status.  EMS call was for initial complaint of vomiting/diarrhea.  On my initial evaluation patient is hypotensive and altered, diaphoretic, tachypneic.  Fluid resuscitation began.  No active emesis.  Fecal occult was positive with black stool, chart review shows no known history of alcohol abuse/cirrhosis, anticoagulation, recent ER visits.  Blood transfusion ordered.  Blood pressure initially responded to fluid resuscitation, mentation slightly improved however patient was still very unstable.  Decision made to intubate the patient.  Intubation successful.  Emergent GI consultation and ICU consultation placed.  Hemoglobin reported to be less than 2, confirmed.  Blood pressure continues to be hypotensive but responsive to resuscitation.  After OG was placed over a liter of frank blood was put out and about 30 minutes. CXR confirms ET tube placement, no free air.  Spoke to on-call GI, Waymon Budge on-call for Dr. Havery Moros.  They are aware of the OG output.  ICU was at bedside will admit, massive transfusion protocol initiated.  Patient will be admitted to the ICU service with goal of GI scope for further definitive care.  It appears that some family has arrived in the consultation room, ICU will update the family.  Patients evaluation and results requires admission for further treatment and care.  Spoke with ICU, reviewed patient's ED course and they accept  admission.  Patient agrees with admission plan, offers no new complaints and is stable/unchanged at time of admit.    Final Clinical Impression(s) / ED Diagnoses Final diagnoses:  Respiratory failure [facility])    Rx / DC Orders ED Discharge Orders     None         Lorelle Gibbs, DO 02/06/2022 1245    Amon Costilla, Alvin Critchley, DO 02/09/2022 1246    Jaden Batchelder, Alvin Critchley, DO 02/05/2022 1339

## 2022-02-13 NOTE — ED Provider Notes (Signed)
Was notified by blood bank that each cryo order is about 4 to 5 units.  Currently the standing order is for 4 orders which be would be approximately 20 units of cryo. Same situation for platelet order.  Confirmed with critical care at bedside that goal was initially 4-5 units of cryo as well as platelets.  Orders will be amended.    Rozelle Logan, DO 2022-03-11 1336

## 2022-02-13 NOTE — ED Triage Notes (Signed)
Vomiting/ diarrhea for 1-2 months, red black emesis and  black/red stools since thursday

## 2022-02-13 NOTE — ED Notes (Signed)
GI at bedside

## 2022-02-13 NOTE — Anesthesia Preprocedure Evaluation (Deleted)
Anesthesia Evaluation    Airway        Dental   Pulmonary neg pulmonary ROS, former smoker,           Cardiovascular negative cardio ROS       Neuro/Psych Encephalopathy requiring intubation    GI/Hepatic (+)     substance abuse (daily ETOH (5th/day liquor))  alcohol use, GIB   Endo/Other  negative endocrine ROS  Renal/GU negative Renal ROS  negative genitourinary   Musculoskeletal negative musculoskeletal ROS (+)   Abdominal   Peds  Hematology  (+) Blood dyscrasia, anemia , Hemorrhagic shock 2/2 GIB requiring MTP, intubation, invasive line placement   Anesthesia Other Findings   Reproductive/Obstetrics                             Anesthesia Physical Anesthesia Plan Anesthesia Quick Evaluation

## 2022-02-14 ENCOUNTER — Inpatient Hospital Stay (HOSPITAL_COMMUNITY): Payer: Self-pay

## 2022-02-14 DIAGNOSIS — F1010 Alcohol abuse, uncomplicated: Secondary | ICD-10-CM

## 2022-02-14 DIAGNOSIS — R571 Hypovolemic shock: Secondary | ICD-10-CM

## 2022-02-14 DIAGNOSIS — J9601 Acute respiratory failure with hypoxia: Secondary | ICD-10-CM

## 2022-02-14 DIAGNOSIS — K7031 Alcoholic cirrhosis of liver with ascites: Principal | ICD-10-CM

## 2022-02-14 LAB — COMPREHENSIVE METABOLIC PANEL
ALT: 108 U/L — ABNORMAL HIGH (ref 0–44)
AST: 636 U/L — ABNORMAL HIGH (ref 15–41)
Albumin: 2.4 g/dL — ABNORMAL LOW (ref 3.5–5.0)
Alkaline Phosphatase: 73 U/L (ref 38–126)
Anion gap: 10 (ref 5–15)
BUN: 29 mg/dL — ABNORMAL HIGH (ref 6–20)
CO2: 25 mmol/L (ref 22–32)
Calcium: 8.1 mg/dL — ABNORMAL LOW (ref 8.9–10.3)
Chloride: 107 mmol/L (ref 98–111)
Creatinine, Ser: 1.37 mg/dL — ABNORMAL HIGH (ref 0.61–1.24)
GFR, Estimated: >60 mL/min (ref >60)
Glucose, Bld: 115 mg/dL — ABNORMAL HIGH (ref 70–99)
Potassium: 3.1 mmol/L — ABNORMAL LOW (ref 3.5–5.1)
Sodium: 142 mmol/L (ref 135–145)
Total Bilirubin: 7.3 mg/dL — ABNORMAL HIGH (ref 0.3–1.2)
Total Protein: 5 g/dL — ABNORMAL LOW (ref 6.5–8.1)

## 2022-02-14 LAB — PREPARE PLATELET PHERESIS
Unit division: 0
Unit division: 0
Unit division: 0
Unit division: 0

## 2022-02-14 LAB — BODY FLUID CELL COUNT WITH DIFFERENTIAL
Eos, Fluid: 0 %
Lymphs, Fluid: 13 %
Monocyte-Macrophage-Serous Fluid: 32 % — ABNORMAL LOW (ref 50–90)
Neutrophil Count, Fluid: 55 % — ABNORMAL HIGH (ref 0–25)
Total Nucleated Cell Count, Fluid: 61 cu mm (ref 0–1000)

## 2022-02-14 LAB — DIC (DISSEMINATED INTRAVASCULAR COAGULATION)PANEL
D-Dimer, Quant: 5.57 ug/mL-FEU — ABNORMAL HIGH (ref 0.00–0.50)
D-Dimer, Quant: 3.58 ug/mL-FEU — ABNORMAL HIGH (ref 0.00–0.50)
D-Dimer, Quant: 3.9 ug/mL-FEU — ABNORMAL HIGH (ref 0.00–0.50)
D-Dimer, Quant: 5.28 ug/mL-FEU — ABNORMAL HIGH (ref 0.00–0.50)
Fibrinogen: 173 mg/dL — ABNORMAL LOW (ref 210–475)
Fibrinogen: 211 mg/dL (ref 210–475)
Fibrinogen: 229 mg/dL (ref 210–475)
Fibrinogen: 216 mg/dL (ref 210–475)
INR: 2 — ABNORMAL HIGH (ref 0.8–1.2)
INR: 2.2 — ABNORMAL HIGH (ref 0.8–1.2)
INR: 2 — ABNORMAL HIGH (ref 0.8–1.2)
INR: 2.1 — ABNORMAL HIGH (ref 0.8–1.2)
Platelets: 65 10*3/uL — ABNORMAL LOW (ref 150–400)
Platelets: 49 10*3/uL — ABNORMAL LOW (ref 150–400)
Platelets: 50 10*3/uL — ABNORMAL LOW (ref 150–400)
Platelets: 51 10*3/uL — ABNORMAL LOW (ref 150–400)
Prothrombin Time: 22.8 seconds — ABNORMAL HIGH (ref 11.4–15.2)
Prothrombin Time: 24.8 seconds — ABNORMAL HIGH (ref 11.4–15.2)
Prothrombin Time: 22.7 seconds — ABNORMAL HIGH (ref 11.4–15.2)
Prothrombin Time: 23.2 seconds — ABNORMAL HIGH (ref 11.4–15.2)
Smear Review: NONE SEEN
Smear Review: NONE SEEN
Smear Review: NONE SEEN
aPTT: 41 seconds — ABNORMAL HIGH (ref 24–36)
aPTT: 39 seconds — ABNORMAL HIGH (ref 24–36)
aPTT: 37 seconds — ABNORMAL HIGH (ref 24–36)
aPTT: 38 seconds — ABNORMAL HIGH (ref 24–36)

## 2022-02-14 LAB — BPAM PLATELET PHERESIS
Blood Product Expiration Date: 202302192359
Blood Product Expiration Date: 202302212359
Blood Product Expiration Date: 202302212359
Blood Product Expiration Date: 202302222359
ISSUE DATE / TIME: 202302191220
ISSUE DATE / TIME: 202302191250
ISSUE DATE / TIME: 202302191500
ISSUE DATE / TIME: 202302191814
Unit Type and Rh: 5100
Unit Type and Rh: 6200
Unit Type and Rh: 6200
Unit Type and Rh: 6200

## 2022-02-14 LAB — PREPARE CRYOPRECIPITATE: Unit division: 0

## 2022-02-14 LAB — BASIC METABOLIC PANEL
Anion gap: 8 (ref 5–15)
BUN: 31 mg/dL — ABNORMAL HIGH (ref 6–20)
CO2: 24 mmol/L (ref 22–32)
Calcium: 8 mg/dL — ABNORMAL LOW (ref 8.9–10.3)
Chloride: 108 mmol/L (ref 98–111)
Creatinine, Ser: 1.4 mg/dL — ABNORMAL HIGH (ref 0.61–1.24)
GFR, Estimated: >60 mL/min (ref >60)
Glucose, Bld: 115 mg/dL — ABNORMAL HIGH (ref 70–99)
Potassium: 3.1 mmol/L — ABNORMAL LOW (ref 3.5–5.1)
Sodium: 140 mmol/L (ref 135–145)

## 2022-02-14 LAB — PREPARE FRESH FROZEN PLASMA: Unit division: 0

## 2022-02-14 LAB — BPAM FFP
Blood Product Expiration Date: 202302202359
Blood Product Expiration Date: 202302202359
Blood Product Expiration Date: 202302202359
Blood Product Expiration Date: 202302202359
Blood Product Expiration Date: 202302202359
Blood Product Expiration Date: 202302202359
Blood Product Expiration Date: 202302202359
Blood Product Expiration Date: 202302202359
Blood Product Expiration Date: 202302202359
Blood Product Expiration Date: 202302202359
Blood Product Expiration Date: 202302242359
ISSUE DATE / TIME: 202302191219
ISSUE DATE / TIME: 202302191219
ISSUE DATE / TIME: 202302191231
ISSUE DATE / TIME: 202302191231
ISSUE DATE / TIME: 202302191500
ISSUE DATE / TIME: 202302191500
ISSUE DATE / TIME: 202302191814
ISSUE DATE / TIME: 202302191500
ISSUE DATE / TIME: 202302191500
ISSUE DATE / TIME: 202302191814
ISSUE DATE / TIME: 202302191814
Unit Type and Rh: 5100
Unit Type and Rh: 5100
Unit Type and Rh: 5100
Unit Type and Rh: 5100
Unit Type and Rh: 5100
Unit Type and Rh: 5100
Unit Type and Rh: 5100
Unit Type and Rh: 5100
Unit Type and Rh: 5100
Unit Type and Rh: 5100
Unit Type and Rh: 5100

## 2022-02-14 LAB — CBC
HCT: 23.5 % — ABNORMAL LOW (ref 39.0–52.0)
HCT: 24 % — ABNORMAL LOW (ref 39.0–52.0)
Hemoglobin: 8 g/dL — ABNORMAL LOW (ref 13.0–17.0)
Hemoglobin: 8.4 g/dL — ABNORMAL LOW (ref 13.0–17.0)
MCH: 28.8 pg (ref 26.0–34.0)
MCH: 29.8 pg (ref 26.0–34.0)
MCHC: 34 g/dL (ref 30.0–36.0)
MCHC: 35 g/dL (ref 30.0–36.0)
MCV: 84.5 fL (ref 80.0–100.0)
MCV: 85.1 fL (ref 80.0–100.0)
Platelets: 49 10*3/uL — ABNORMAL LOW (ref 150–400)
Platelets: 55 10*3/uL — ABNORMAL LOW (ref 150–400)
RBC: 2.78 MIL/uL — ABNORMAL LOW (ref 4.22–5.81)
RBC: 2.82 MIL/uL — ABNORMAL LOW (ref 4.22–5.81)
RDW: 15.7 % — ABNORMAL HIGH (ref 11.5–15.5)
RDW: 16.7 % — ABNORMAL HIGH (ref 11.5–15.5)
WBC: 13.4 10*3/uL — ABNORMAL HIGH (ref 4.0–10.5)
WBC: 10.2 10*3/uL (ref 4.0–10.5)
nRBC: 0.3 % — ABNORMAL HIGH (ref 0.0–0.2)
nRBC: 0.6 % — ABNORMAL HIGH (ref 0.0–0.2)

## 2022-02-14 LAB — CBC WITH DIFFERENTIAL/PLATELET
Abs Immature Granulocytes: 0.05 10*3/uL (ref 0.00–0.07)
Abs Immature Granulocytes: 0.06 10*3/uL (ref 0.00–0.07)
Basophils Absolute: 0 10*3/uL (ref 0.0–0.1)
Basophils Absolute: 0 10*3/uL (ref 0.0–0.1)
Basophils Relative: 0 %
Basophils Relative: 0 %
Eosinophils Absolute: 0 10*3/uL (ref 0.0–0.5)
Eosinophils Absolute: 0 10*3/uL (ref 0.0–0.5)
Eosinophils Relative: 0 %
Eosinophils Relative: 0 %
HCT: 23.9 % — ABNORMAL LOW (ref 39.0–52.0)
HCT: 24.6 % — ABNORMAL LOW (ref 39.0–52.0)
Hemoglobin: 8.1 g/dL — ABNORMAL LOW (ref 13.0–17.0)
Hemoglobin: 8.2 g/dL — ABNORMAL LOW (ref 13.0–17.0)
Immature Granulocytes: 0 %
Immature Granulocytes: 1 %
Lymphocytes Relative: 9 %
Lymphocytes Relative: 9 %
Lymphs Abs: 1.2 10*3/uL (ref 0.7–4.0)
Lymphs Abs: 0.8 10*3/uL (ref 0.7–4.0)
MCH: 28.6 pg (ref 26.0–34.0)
MCH: 28.9 pg (ref 26.0–34.0)
MCHC: 33.9 g/dL (ref 30.0–36.0)
MCHC: 33.3 g/dL (ref 30.0–36.0)
MCV: 84.5 fL (ref 80.0–100.0)
MCV: 86.6 fL (ref 80.0–100.0)
Monocytes Absolute: 1.3 10*3/uL — ABNORMAL HIGH (ref 0.1–1.0)
Monocytes Absolute: 1.1 10*3/uL — ABNORMAL HIGH (ref 0.1–1.0)
Monocytes Relative: 10 %
Monocytes Relative: 12 %
Neutro Abs: 10 10*3/uL — ABNORMAL HIGH (ref 1.7–7.7)
Neutro Abs: 7.3 10*3/uL (ref 1.7–7.7)
Neutrophils Relative %: 81 %
Neutrophils Relative %: 78 %
Platelets: 51 10*3/uL — ABNORMAL LOW (ref 150–400)
Platelets: 53 10*3/uL — ABNORMAL LOW (ref 150–400)
RBC: 2.83 MIL/uL — ABNORMAL LOW (ref 4.22–5.81)
RBC: 2.84 MIL/uL — ABNORMAL LOW (ref 4.22–5.81)
RDW: 16.2 % — ABNORMAL HIGH (ref 11.5–15.5)
RDW: 16.9 % — ABNORMAL HIGH (ref 11.5–15.5)
WBC: 12.5 10*3/uL — ABNORMAL HIGH (ref 4.0–10.5)
WBC: 9.3 10*3/uL (ref 4.0–10.5)
nRBC: 0.4 % — ABNORMAL HIGH (ref 0.0–0.2)
nRBC: 0.4 % — ABNORMAL HIGH (ref 0.0–0.2)

## 2022-02-14 LAB — BPAM CRYOPRECIPITATE
Blood Product Expiration Date: 202302191824
ISSUE DATE / TIME: 202302191242
Unit Type and Rh: 5100

## 2022-02-14 LAB — GLUCOSE, CAPILLARY
Glucose-Capillary: 103 mg/dL — ABNORMAL HIGH (ref 70–99)
Glucose-Capillary: 116 mg/dL — ABNORMAL HIGH (ref 70–99)
Glucose-Capillary: 118 mg/dL — ABNORMAL HIGH (ref 70–99)
Glucose-Capillary: 121 mg/dL — ABNORMAL HIGH (ref 70–99)
Glucose-Capillary: 127 mg/dL — ABNORMAL HIGH (ref 70–99)
Glucose-Capillary: 131 mg/dL — ABNORMAL HIGH (ref 70–99)

## 2022-02-14 LAB — HEPATITIS PANEL, ACUTE
HCV Ab: NONREACTIVE
Hep A IgM: NONREACTIVE
Hep B C IgM: NONREACTIVE
Hepatitis B Surface Ag: NONREACTIVE

## 2022-02-14 LAB — TRIGLYCERIDES: Triglycerides: 81 mg/dL (ref <150)

## 2022-02-14 LAB — ALBUMIN, PLEURAL OR PERITONEAL FLUID: Albumin, Fluid: <1.5 g/dL

## 2022-02-14 LAB — HEPATITIS A ANTIBODY, TOTAL: hep A Total Ab: REACTIVE — AB

## 2022-02-14 LAB — FERRITIN: Ferritin: 22 ng/mL — ABNORMAL LOW (ref 24–336)

## 2022-02-14 LAB — PROTEIN, PLEURAL OR PERITONEAL FLUID: Total protein, fluid: <3 g/dL

## 2022-02-14 LAB — LACTIC ACID, PLASMA: Lactic Acid, Venous: 1.2 mmol/L (ref 0.5–1.9)

## 2022-02-14 LAB — GAMMA GT: GGT: 33 U/L (ref 7–50)

## 2022-02-14 MED ORDER — LIDOCAINE HCL (PF) 1 % IJ SOLN
INTRAMUSCULAR | Status: AC
  Filled 2022-02-14: qty 5

## 2022-02-14 MED ORDER — SODIUM CHLORIDE 0.9 % IV SOLN
2.0000 g | INTRAVENOUS | Status: DC
  Administered 2022-02-14: 2 g via INTRAVENOUS
  Filled 2022-02-14: qty 20

## 2022-02-14 MED ORDER — ALBUMIN HUMAN 25 % IV SOLN
50.0000 g | Freq: Once | INTRAVENOUS | Status: DC
Start: 2022-02-14 — End: 2022-02-14
  Filled 2022-02-14: qty 200

## 2022-02-14 MED ORDER — POTASSIUM CHLORIDE 10 MEQ/50ML IV SOLN
10.0000 meq | INTRAVENOUS | Status: AC
  Administered 2022-02-14 (×4): 10 meq via INTRAVENOUS
  Filled 2022-02-14 (×4): qty 50

## 2022-02-14 MED ORDER — SODIUM CHLORIDE 0.9 % IV SOLN: 1.0000 g | INTRAVENOUS | Status: DC

## 2022-02-14 MED ORDER — PHENOBARBITAL SODIUM 130 MG/ML IJ SOLN
97.5000 mg | Freq: Three times a day (TID) | INTRAMUSCULAR | Status: DC
  Administered 2022-02-14 – 2022-02-15 (×4): 97.5 mg via INTRAVENOUS
  Filled 2022-02-14 (×4): qty 1

## 2022-02-14 MED ORDER — MAGNESIUM SULFATE 2 GM/50ML IV SOLN
2.0000 g | Freq: Once | INTRAVENOUS | Status: AC
  Administered 2022-02-14: 2 g via INTRAVENOUS
  Filled 2022-02-14: qty 50

## 2022-02-14 MED ORDER — SODIUM CHLORIDE 0.9 % IV SOLN
1.0000 mg | Freq: Once | INTRAVENOUS | Status: AC
  Administered 2022-02-14: 1 mg via INTRAVENOUS
  Filled 2022-02-14: qty 0.2

## 2022-02-14 MED ORDER — SODIUM CHLORIDE 0.9 % IV SOLN: 2.0000 g | INTRAVENOUS | Status: DC

## 2022-02-14 MED ORDER — LACTULOSE ENEMA
300.0000 mL | ORAL | Status: DC
  Administered 2022-02-14 – 2022-02-15 (×5): 300 mL via RECTAL
  Filled 2022-02-14 (×6): qty 300

## 2022-02-14 MED ORDER — PHENOBARBITAL SODIUM 65 MG/ML IJ SOLN: 32.4000 mg | Freq: Three times a day (TID) | INTRAMUSCULAR | Status: DC

## 2022-02-14 MED ORDER — PHENOBARBITAL SODIUM 65 MG/ML IJ SOLN: 65.0000 mg | Freq: Three times a day (TID) | INTRAMUSCULAR | Status: DC

## 2022-02-14 MED ORDER — PANTOPRAZOLE SODIUM 40 MG IV SOLR
40.0000 mg | Freq: Two times a day (BID) | INTRAVENOUS | Status: DC
  Administered 2022-02-14 – 2022-02-16 (×5): 40 mg via INTRAVENOUS
  Filled 2022-02-14 (×5): qty 10

## 2022-02-14 MED ORDER — THIAMINE HCL 100 MG/ML IJ SOLN
100.0000 mg | Freq: Every day | INTRAMUSCULAR | Status: DC
  Administered 2022-02-14 – 2022-02-17 (×4): 100 mg via INTRAVENOUS
  Filled 2022-02-14 (×4): qty 2

## 2022-02-14 NOTE — Progress Notes (Signed)
NAME:  Harold Howard, MRN:  [id number], DOB:  [birth year], LOS: 1 ADMISSION DATE:  02/05/2022, CONSULTATION DATE:  02/17/2022 REFERRING MD:  Gustavus Messing, CHIEF COMPLAINT:  hemorrhagic shock   History of Present Illness:  Harold Howard is a 41 y/o gentleman with a history of daily ETOH use who presents with 1 month of vomiting blood and dark stools. History is provided by the ED and his step sister as he is intubated and sedated. His family is estranged due to his ETOH consumption. His step-sister indicated she is the only family he is in contact with. He drinks about a fifth per day, but has been working on cutting back recently, but he still drinks about a half gallon of liquor every 2-3 days. He came home from work early yesterday after texting his roommate a picture of a toilet bowl full of BRB. He was found on the floor this morning screaming to call 911. No emesis with EMS. Received 500cc crystalloid and zofran. He was hypotensive and developed worsening encephalopathy in the ED and required intubation. He had a seizure in the ED.  He received 8 pRBCs. PCCM consulted for admission. GI consulted by the ED.  No known use of NSAIDs or anticoagulants. His step-sister is not clear on his medical history.  Pertinent  Medical History  ETOH abuse  Significant Hospital Events: Including procedures, antibiotic start and stop dates in addition to other pertinent events   2/19 admission, intubated sedated, EGD   Interim History / Subjective:  Intubated and Sedated  Partner at bedside   Objective   Blood pressure (!) 104/54, pulse 99, temperature 98.8 F (37.1 C), temperature source Bladder, resp. rate (!) 26, height 5\' 9"  (1.753 m), weight 81 kg, SpO2 98 %.    Vent Mode: PRVC FiO2 (%):  [40 %-100 %] 40 % Set Rate:  [18 bmp-35 bmp] 25 bmp Vt Set:  [560 mL-600 mL] 560 mL PEEP:  [5 cmH20] 5 cmH20 Plateau Pressure:  [16 cmH20-22 cmH20] 18 cmH20   Intake/Output Summary (Last 24 hours) at 02/14/2022  0737 Last data filed at 02/14/2022 0600 Gross per 24 hour  Intake 3120.65 ml  Output 1400 ml  Net 1720.65 ml   Filed Weights   01/31/2022 0958 02/14/22 0500  Weight: 76 kg 81 kg   Vitals last 24 hours:  T 94.1 - 99.7  PR 45-113  RR 11-53  SBP 66-134  DBP 25-95  SpO2 91-100 on ventilator   Examination: General: critically ill appearing man intubated, sedated HENT: [place]/AT, eyes anicteric, eyes anicteric Lungs: CTAB, synchronous with MV Cardiovascular: S1S2, tachycardic Abdomen: soft, NT, no hepatomegaly Extremities: no peripheral edema, no cyanosis or clubbing Neuro: RASS -1, PERRL, following commands  Derm: no telangiectasias or spider angioma, no bruising  Labs Hypokalemia 3.1    BUN stable at 29 Cr improved to 1.37 T bili increased to 7.3  WBC down trending to 12.5  H/H 8.1/23.9   Liver enzyme elevated  AST  636  ALT 108   Images  2/19 RUQ 3/19: cirrhotic hepatic morphology, abdominopelvic ascites, no gallstones or biliary dilation, bidirectional flow is suspected in the main portal vein with varicosities at the hepatic hilum   Procedures:  2/19 EGD: EGD yesterday with large esophageal varices, suspected gastric varices, and portal hypertensive gastropathy   Antibiotics:  2/19 Ceftriaxone for SBP prophylaxis   Resolved Hospital Problem list   Hypocalcemia   Assessment & Plan:  Hemorrhagic shock, acute on subacute GIB secondary to alcohol use  disorder. EGD on 2/19 showed large esophageal varices, suspected gastric varices, and portal hypertensive gastropathy. Hemodynamically stable and hemoglobin 8.1 on 2/20.  -Transfuse to maintain Hb >7 and hemodynamic stability -Continue IV octreotide -Continue pantoprazole IV BID  -Continue Ceftriaxone for SBP prophylaxis  -Appreciate GI's assistance -Recommend ETOH cessation; can counsel when appropriate -Hold chemical DVT prophylaxis  Acute respiratory failure with hypoxia -LTVV, 4-8cc/kg IBW with goal Pplat<30  -VAP  prevention protocol -PAD protocol for sedation -daily SAT & SBT when stable  Severe metabolic acidosis likely due to shock Lactic acid 9.0 on 2/19.  -repeat lactic acid  -monitor -may need bicarb gtt if not correcting   Decompensated cirrhosis  Hyperbilirubinemia and elevated transaminase due to hypoperfusion, ETOH abuse. 2/19 RUQ [place]: cirrhotic hepatic morphology, abdominopelvic ascites, no gallstones or biliary dilation, bidirectional flow is suspected in the main portal vein with varicosities at the hepatic hilum. MELD score 24.  -Maintain adequate perfusion -Outpatient GI follow up  -Initiate nonselective beta blocker for portal hypertension once BP stabilized -ETOH cessation    AKI Improving with creatinine 1.37 today.  -maintain adequate perfusion -strict I/Os -renally dose meds, avoid nephrotoxic meds  Hypokalemia  K of 3.1 on 2/20  - Replete with goal K > 4   Seizure, likely due to profound hypoperfusion and hypoxia Head CT on 2/19 showed no acute intracranial pathology.  -if recurrent, add keppra -ativan PRN -seizure precautions  ETOH abuse -recommend cessation -vitamins when able to take PO -monitor for withdrawal  Best Practice (right click and "Reselect all SmartList Selections" daily)   Diet/type: NPO DVT prophylaxis: SCD GI prophylaxis: PPI Lines: Central line and Arterial Line Foley:  Yes, and it is still needed Code Status:  full code Last date of multidisciplinary goals of care discussion [ with step sister on 2/19]  Labs   CBC: Recent Labs  Lab 02/07/2022 1055 02/10/2022 1315 02/02/2022 1653 02/09/2022 1703 02/06/2022 1915 02/14/22 0115 02/14/22 0550  WBC 16.7*  --  20.0*  --  18.3* 13.4* 12.5*  NEUTROABS  --   --   --   --   --   --  10.0*  HGB 1.9*   < > 8.2* 7.8* 7.9* 8.0* 8.1*  HCT 9.1*   < > 24.3* 23.0* 23.6* 23.5* 23.9*  MCV 109.6*  --  87.1  --  85.8 84.5 84.5  PLT 143*  --  PLATELET CLUMPS NOTED ON SMEAR, UNABLE TO ESTIMATE   PLATELET  CLUMPS NOTED ON SMEAR, UNABLE TO ESTIMATE  --  65*   66* 49*   49* PENDING   51*   < > = values in this interval not displayed.    Basic Metabolic Panel: Recent Labs  Lab 02/12/2022 1135 02/02/2022 1315 02/19/2022 1440 01/26/2022 1653 02/11/2022 1703 02/14/22 0550  NA 136 137 137  --  140 142  K 4.6 3.9 3.6  --  3.2* 3.1*  CL 100  --   --   --   --  107  CO2 <7*  --   --   --   --  25  GLUCOSE 111*  --   --   --   --  115*  BUN 29*  --   --   --   --  29*  CREATININE 1.86*  --   --   --   --  1.37*  CALCIUM 8.2*  --   --   --   --  8.1*  MG  --   --   --  1.6*  --   --   PHOS  --   --   --  4.8*  --   --    GFR: Estimated Creatinine Clearance: 71.7 mL/min (A) (by C-G formula based on SCr of 1.37 mg/dL (H)). Recent Labs  Lab 02/19/2022 1304 02/06/2022 1504 02/08/2022 1653 01/28/2022 1915 02/14/22 0115 02/14/22 0550  WBC  --   --  20.0* 18.3* 13.4* 12.5*  LATICACIDVEN >9.0* >9.0*  --   --   --   --     Liver Function Tests: Recent Labs  Lab 02/03/2022 1135 02/14/22 0550  AST 86* 636*  ALT <5 108*  ALKPHOS 80 73  BILITOT 4.1* 7.3*  PROT 4.1* 5.0*  ALBUMIN 1.6* 2.4*   No results for input(s): LIPASE, AMYLASE in the last 168 hours. Recent Labs  Lab 02/14/2022 1304  AMMONIA 144*    ABG    Component Value Date/Time   PHART 7.456 (H) 01/29/2022 1703   PCO2ART 21.0 (L) 02/19/2022 1703   PO2ART 115 (H) 02/04/2022 1703   HCO3 14.9 (L) 02/20/2022 1703   TCO2 16 (L) 02/12/2022 1703   ACIDBASEDEF 8.0 (H) 02/19/2022 1703   O2SAT 99 02/20/2022 1703     Coagulation Profile: Recent Labs  Lab 02/09/2022 1653 02/12/2022 1915 02/14/22 0115 02/14/22 0550  INR 2.0*   1.9* 2.0* 2.2* 2.0*    Cardiac Enzymes: No results for input(s): CKTOTAL, CKMB, CKMBINDEX, TROPONINI in the last 168 hours.  HbA1C: No results found for: HGBA1C  CBG: Recent Labs  Lab 02/03/2022 1054 02/09/2022 1425 02/03/2022 1942 02/19/2022 2326 02/14/22 0321  GLUCAP 96 121* 127* 128* 118*

## 2022-02-14 NOTE — Procedures (Signed)
Paracentesis Procedure Note  ORR VISCOMI  RD:[id number]  1981-05-29  Date:02/14/22  Time:3:29 PM   Provider Performing:Bobetta Korf Charleen Kirks    Procedure: Paracentesis with imaging guidance [id number])  Indication(s) Ascites  Consent Risks of the procedure as well as the alternatives and risks of each were explained to the patient and/or caregiver.  Consent for the procedure was obtained and is signed in the bedside chart  Anesthesia Topical only with 1% lidocaine    Time Out Verified patient identification, verified procedure, site/side was marked, verified correct patient position, special equipment/implants available, medications/allergies/relevant history reviewed, required imaging and test results available.   Sterile Technique Maximal sterile technique including full sterile barrier drape, hand hygiene, sterile gown, sterile gloves, mask, hair covering, sterile ultrasound probe cover (if used).   Procedure Description Ultrasound used to identify appropriate peritoneal anatomy for placement and overlying skin marked.  Area of drainage cleaned and draped in sterile fashion. Lidocaine was used to anesthetize the skin and subcutaneous tissue.  40 cc's of yellow appearing fluid was drained. Catheter then removed and gaze applied to site.   Complications/Tolerance None; patient tolerated the procedure well.   EBL Minimal   Specimen(s) Peritoneal fluid

## 2022-02-14 NOTE — Progress Notes (Addendum)
Progress Note Hospital Day: 2  Chief Complaint:  GI bleed, liver disease       ASSESSMENT AND PLAN   Brief History: 41 yo male with admitted with Etoh abuse / probable cirrhosis / hemodynamically unstable GI bleed / profound anemia with hgb of 1.9  # 41 yo male with Etoh abuse /  probable cirrhosis with ascites / variceal bleed though acute Etoh can cause portal HTN.  --Required multiple units of PRBCs / FFP. Hgb stable at 8.1 today. OGT was removed during EGD yesterday but no hematemesis or melena to suggest ongoing GI bleed --Overnight labs: platelets 50, INR stable at 2. AST up from 86 to 636, ALT from <5 to 108. Tbili 4 >> 7.3.  --Continue Octreotide, PPI infusion --Continue Rocephin for SBP prophylaxis --Will order complete hepatic serologic workup to evaluate for other etiologies of chronic liver disease. --Will need diagnostic paracentesis at some point --Still intubated. On low dose pressor --Will start Lactulose at some point. Would recommend enemas rather rather reinserting NG or OG given varices / multiple bands    OBJECTIVE   2/18 EGD -Large (> 5 mm) esophageal varices with stigmata of recent bleeding. Banded x 11 with good result - Reflux esophagitis - Portal hypertensive gastropathy. - Suspected gastric varices without stigmata for bleeding - Normal duodenal bulb and second portion of the duodenum.   Scheduled inpatient medications:   sodium chloride   Intravenous Once   sodium chloride   Intravenous Once   chlorhexidine gluconate (MEDLINE KIT)  15 mL Mouth Rinse BID   Chlorhexidine Gluconate Cloth  6 each Topical Daily   mouth rinse  15 mL Mouth Rinse 10 times per day   [START ON 02/17/2022] pantoprazole  40 mg Intravenous Q12H   PHENObarbital  97.5 mg Intravenous Q8H   Followed by   [START ON 02/16/2022] PHENObarbital  65 mg Intravenous Q8H   Followed by   [START ON 02/18/2022] PHENObarbital  32.4 mg Intravenous Q8H   thiamine injection  100 mg  Intravenous Daily   Continuous inpatient infusions:   sodium chloride     sodium chloride     cefTRIAXone (ROCEPHIN)  IV     fentaNYL infusion INTRAVENOUS 150 mcg/hr (20/35/59 7416)   folic acid (FOLVITE) IVPB     magnesium sulfate bolus IVPB     octreotide  (SANDOSTATIN)    IV infusion 50 mcg/hr (02/14/22 0900)   pantoprazole 8 mg/hr (02/14/22 0900)   phenylephrine (NEO-SYNEPHRINE) Adult infusion 40 mcg/min (02/14/22 0917)   potassium chloride     propofol (DIPRIVAN) infusion Stopped (02/12/2022 1802)   PRN inpatient medications: albuterol, fentaNYL, fentaNYL, LORazepam, midazolam, midazolam  Vital signs in last 24 hours: Temp:  [94.1 F (34.5 C)-99.7 F (37.6 C)] 98.8 F (37.1 C) (02/20 0400) Pulse Rate:  [45-116] 98 (02/20 0900) Resp:  [11-53] 25 (02/20 0900) BP: (66-134)/(25-95) 104/54 (02/20 0733) SpO2:  [91 %-100 %] 97 % (02/20 0900) Arterial Line BP: (89-140)/(45-68) 103/53 (02/20 0900) FiO2 (%):  [40 %-100 %] 40 % (02/20 0900) Weight:  [76 kg-81 kg] 81 kg (02/20 0500) Last BM Date :  (PTA)  Intake/Output Summary (Last 24 hours) at 02/14/2022 0941 Last data filed at 02/14/2022 0900 Gross per 24 hour  Intake 3360.62 ml  Output 1455 ml  Net 1905.62 ml     Physical Exam:  General: partially sedated male. Intubated Heart:  Regular rate and rhythm. No lower extremity edema Pulmonary: Intubated on ventilator Abdomen: Soft, moderately distended, a  few bowel sounds.  Neurologic: partially sedated   Filed Weights   02/12/2022 0958 02/14/22 0500  Weight: 76 kg 81 kg    ntake/Output from previous day: 02/19 0701 - 02/20 0700 In: 3200.7 [I.V.:1519.2; Blood:1480; IV Piggyback:201.4] Out: 1400 [Urine:500; Emesis/NG output:900] Intake/Output this shift: Total I/O In: 160 [I.V.:160] Out: 88 [Urine:55]   DIAGNOSTIC STUDIES THIS ADMISSION:  CT HEAD WO CONTRAST (5MM)  Result Date: 02/12/2022 CLINICAL DATA:  Seizure, altered mental status EXAM: CT HEAD WITHOUT CONTRAST  TECHNIQUE: Contiguous axial images were obtained from the base of the skull through the vertex without intravenous contrast. RADIATION DOSE REDUCTION: This exam was performed according to the departmental dose-optimization program which includes automated exposure control, adjustment of the mA and/or kV according to patient size and/or use of iterative reconstruction technique. COMPARISON:  None. FINDINGS: Brain: No evidence of acute infarction, hemorrhage, hydrocephalus, extra-axial collection or mass lesion/mass effect. Vascular: No hyperdense vessel or unexpected calcification. Skull: Normal. Negative for fracture or focal lesion. Sinuses/Orbits: No acute finding. Other: None. IMPRESSION: No acute intracranial pathology. Electronically Signed   By: Delanna Ahmadi M.D.   On: 01/31/2022 14:30   Portable Chest xray  Result Date: 02/14/2022 CLINICAL DATA:  Respiratory failure EXAM: PORTABLE CHEST 1 VIEW COMPARISON:  Chest radiograph 1 day prior FINDINGS: Endotracheal tube tip is approximately 2.8 cm from the carina. The enteric catheter has been removed. The cardiomediastinal silhouette is normal. There is no focal consolidation or pulmonary edema. There is no pleural effusion or pneumothorax. Overall, aeration is not significantly changed compared to the study from 1 day prior. The bones are stable. IMPRESSION: No focal consolidation or pleural effusion. Overall, no significant interval change since the study from 1 day prior. Electronically Signed   By: Valetta Mole M.D.   On: 02/14/2022 08:10   DG Chest Port 1 View  Result Date: 01/29/2022 CLINICAL DATA:  Post intubation EXAM: PORTABLE CHEST 1 VIEW COMPARISON:  None. FINDINGS: Endotracheal tube tip is 3.2 cm above the carina. Enteric tube tip in the stomach. Cardiomediastinal silhouette within normal limits. No focal consolidation, pleural effusion or pneumothorax. IMPRESSION: Medical devices as described.  No acute process identified. Electronically Signed    By: Ofilia Neas M.D.   On: 02/18/2022 12:07   US Abdomen Limited RUQ (LIVER/GB)  Result Date: 02/20/2022 CLINICAL DATA:  Alcohol abuse.  Hyperbilirubinemia. EXAM: ULTRASOUND ABDOMEN LIMITED RIGHT UPPER QUADRANT COMPARISON:  None. FINDINGS: Gallbladder: Physiologically distended. No gallstones or wall thickening visualized. Could not assess for sonographic Murphy sign, patient is intubated. Common bile duct: Diameter: 5 mm, normal. Liver: Heterogeneous increased hepatic parenchymal echogenicity. There is mild capsular nodularity. Difficult to assess for focal liver lesion given the significant heterogeneous appearance of the liver. Portal vein is patent on color Doppler imaging. Flow within the portal vein may to and fro/bidirectional. Other: Varicosities are noted at the hepatic hilum. Moderate volume abdominopelvic ascites, greatest in the lower quadrants. IMPRESSION: 1. Cirrhotic hepatic morphology. Liver parenchyma is diffusely heterogeneous which limits assessment for focal lesion. 2. Bidirectional flow is suspected in the main portal vein with varicosities noted at the hepatic hilum. 3. Moderate abdominopelvic ascites. 4. No gallstones or biliary dilatation. Electronically Signed   By: Keith Rake M.D.   On: 02/02/2022 17:28       Lab Results: Recent Labs    01/27/2022 1915 02/14/22 0115 02/14/22 0550  WBC 18.3* 13.4* 12.5*  HGB 7.9* 8.0* 8.1*  HCT 23.6* 23.5* 23.9*  PLT 65*   66* 49*  49* 50*   51*   BMET Recent Labs    02/18/2022 1135 02/07/2022 1315 02/15/2022 1440 02/18/2022 1703 02/14/22 0550  NA 136   < > 137 140 142  K 4.6   < > 3.6 3.2* 3.1*  CL 100  --   --   --  107  CO2 <7*  --   --   --  25  GLUCOSE 111*  --   --   --  115*  BUN 29*  --   --   --  29*  CREATININE 1.86*  --   --   --  1.37*  CALCIUM 8.2*  --   --   --  8.1*   < > = values in this interval not displayed.   LFT Recent Labs    02/14/22 0550  PROT 5.0*  ALBUMIN 2.4*  AST 636*  ALT 108*   ALKPHOS 73  BILITOT 7.3*   PT/INR Recent Labs    02/14/22 0115 02/14/22 0550  LABPROT 24.8* 22.7*  INR 2.2* 2.0*   Hepatitis Panel Recent Labs    01/31/2022 1653  HEPBSAG NON REACTIVE  HCVAB NON REACTIVE      Principal Problem:   Upper GI bleed Active Problems:   ETOH abuse   Hypovolemic shock (HCC)   Bleeding esophageal varices ([place])     LOS: 1 day   Tye Savoy ,NP 02/14/2022, 9:41 AM    Attending physician's note   I have reviewed the chart and examined the patient. I performed a substantive portion of this encounter, including complete performance of at least one of the key components, in conjunction with the APP. I agree with the Advanced Practitioner's note, impression and recommendations. I have also discussed in detail with patient's significant other (who happens to be EMT)  Variceal bleed s/p EVL x 11 (2/18) after massive transfusion protocol. No further bleeding. Hb stable.  Remains intubated for now EtOH liver cirrhosis with portal hypertension. MELD 22 Continued EtOH abuse.  On withdrawal protocol. Failed multiple previous interventions.  Not a candidate for liver transplant as yet Worsening LFTs with jaundice (TB 7.3)  Plan: -Trend CBC, CMP, PT INR -IV octreotide -IV Protonix -IV Rocephin -WU for any other etiology of chronic liver disease. -Proceed with US guided paracentesis.  Send fluid for cell count, albumin. -Lactulose enemas. -If rebleed, TIPS    Carmell Austria, MD [facility] [phone]

## 2022-02-15 DIAGNOSIS — I8511 Secondary esophageal varices with bleeding: Secondary | ICD-10-CM

## 2022-02-15 LAB — CBC WITH DIFFERENTIAL/PLATELET
Abs Immature Granulocytes: 0.06 10*3/uL (ref 0.00–0.07)
Abs Immature Granulocytes: 0.08 10*3/uL — ABNORMAL HIGH (ref 0.00–0.07)
Basophils Absolute: 0 10*3/uL (ref 0.0–0.1)
Basophils Absolute: 0 10*3/uL (ref 0.0–0.1)
Basophils Relative: 0 %
Basophils Relative: 0 %
Eosinophils Absolute: 0 10*3/uL (ref 0.0–0.5)
Eosinophils Absolute: 0 10*3/uL (ref 0.0–0.5)
Eosinophils Relative: 0 %
Eosinophils Relative: 0 %
HCT: 26.3 % — ABNORMAL LOW (ref 39.0–52.0)
HCT: 26.4 % — ABNORMAL LOW (ref 39.0–52.0)
Hemoglobin: 8.7 g/dL — ABNORMAL LOW (ref 13.0–17.0)
Hemoglobin: 8.7 g/dL — ABNORMAL LOW (ref 13.0–17.0)
Immature Granulocytes: 1 %
Immature Granulocytes: 1 %
Lymphocytes Relative: 9 %
Lymphocytes Relative: 7 %
Lymphs Abs: 1 10*3/uL (ref 0.7–4.0)
Lymphs Abs: 0.8 10*3/uL (ref 0.7–4.0)
MCH: 29 pg (ref 26.0–34.0)
MCH: 29.2 pg (ref 26.0–34.0)
MCHC: 33.1 g/dL (ref 30.0–36.0)
MCHC: 33 g/dL (ref 30.0–36.0)
MCV: 87.7 fL (ref 80.0–100.0)
MCV: 88.6 fL (ref 80.0–100.0)
Monocytes Absolute: 1.1 10*3/uL — ABNORMAL HIGH (ref 0.1–1.0)
Monocytes Absolute: 1.4 10*3/uL — ABNORMAL HIGH (ref 0.1–1.0)
Monocytes Relative: 10 %
Monocytes Relative: 13 %
Neutro Abs: 8.9 10*3/uL — ABNORMAL HIGH (ref 1.7–7.7)
Neutro Abs: 8.6 10*3/uL — ABNORMAL HIGH (ref 1.7–7.7)
Neutrophils Relative %: 80 %
Neutrophils Relative %: 79 %
Platelets: 66 10*3/uL — ABNORMAL LOW (ref 150–400)
Platelets: 77 10*3/uL — ABNORMAL LOW (ref 150–400)
RBC: 3 MIL/uL — ABNORMAL LOW (ref 4.22–5.81)
RBC: 2.98 MIL/uL — ABNORMAL LOW (ref 4.22–5.81)
RDW: 17.5 % — ABNORMAL HIGH (ref 11.5–15.5)
RDW: 18.1 % — ABNORMAL HIGH (ref 11.5–15.5)
WBC: 11 10*3/uL — ABNORMAL HIGH (ref 4.0–10.5)
WBC: 10.8 10*3/uL — ABNORMAL HIGH (ref 4.0–10.5)
nRBC: 0.3 % — ABNORMAL HIGH (ref 0.0–0.2)
nRBC: 0.2 % (ref 0.0–0.2)

## 2022-02-15 LAB — COMPREHENSIVE METABOLIC PANEL
ALT: 149 U/L — ABNORMAL HIGH (ref 0–44)
AST: 593 U/L — ABNORMAL HIGH (ref 15–41)
Albumin: 2.5 g/dL — ABNORMAL LOW (ref 3.5–5.0)
Alkaline Phosphatase: 87 U/L (ref 38–126)
Anion gap: 10 (ref 5–15)
BUN: 41 mg/dL — ABNORMAL HIGH (ref 6–20)
CO2: 25 mmol/L (ref 22–32)
Calcium: 8.2 mg/dL — ABNORMAL LOW (ref 8.9–10.3)
Chloride: 109 mmol/L (ref 98–111)
Creatinine, Ser: 1.72 mg/dL — ABNORMAL HIGH (ref 0.61–1.24)
GFR, Estimated: 51 mL/min — ABNORMAL LOW (ref >60)
Glucose, Bld: 140 mg/dL — ABNORMAL HIGH (ref 70–99)
Potassium: 3.7 mmol/L (ref 3.5–5.1)
Sodium: 144 mmol/L (ref 135–145)
Total Bilirubin: 6.4 mg/dL — ABNORMAL HIGH (ref 0.3–1.2)
Total Protein: 5.5 g/dL — ABNORMAL LOW (ref 6.5–8.1)

## 2022-02-15 LAB — PATHOLOGIST SMEAR REVIEW

## 2022-02-15 LAB — GLUCOSE, CAPILLARY
Glucose-Capillary: 125 mg/dL — ABNORMAL HIGH (ref 70–99)
Glucose-Capillary: 133 mg/dL — ABNORMAL HIGH (ref 70–99)
Glucose-Capillary: 146 mg/dL — ABNORMAL HIGH (ref 70–99)
Glucose-Capillary: 162 mg/dL — ABNORMAL HIGH (ref 70–99)
Glucose-Capillary: 144 mg/dL — ABNORMAL HIGH (ref 70–99)
Glucose-Capillary: 148 mg/dL — ABNORMAL HIGH (ref 70–99)

## 2022-02-15 LAB — ALPHA-1-ANTITRYPSIN: A-1 Antitrypsin, Ser: 164 mg/dL (ref 95–164)

## 2022-02-15 LAB — HAPTOGLOBIN: Haptoglobin: 12 mg/dL — ABNORMAL LOW (ref 17–317)

## 2022-02-15 LAB — AFP TUMOR MARKER: AFP, Serum, Tumor Marker: 3.4 ng/mL (ref 0.0–6.9)

## 2022-02-15 LAB — PROTIME-INR
INR: 2.2 — ABNORMAL HIGH (ref 0.8–1.2)
Prothrombin Time: 24.1 seconds — ABNORMAL HIGH (ref 11.4–15.2)

## 2022-02-15 LAB — ANTI-SMOOTH MUSCLE ANTIBODY, IGG: F-Actin IgG: 11 Units (ref 0–19)

## 2022-02-15 LAB — PHOSPHORUS: Phosphorus: 4.6 mg/dL (ref 2.5–4.6)

## 2022-02-15 LAB — MAGNESIUM: Magnesium: 2.3 mg/dL (ref 1.7–2.4)

## 2022-02-15 LAB — TISSUE TRANSGLUTAMINASE, IGA: Tissue Transglutaminase Ab, IgA: 2 U/mL (ref 0–3)

## 2022-02-15 LAB — MITOCHONDRIAL ANTIBODIES: Mitochondrial M2 Ab, IgG: <20 Units (ref 0.0–20.0)

## 2022-02-15 LAB — GLIADIN ANTIBODIES, SERUM
Antigliadin Abs, IgA: 33 units — ABNORMAL HIGH (ref 0–19)
Gliadin IgG: 9 units (ref 0–19)

## 2022-02-15 LAB — APTT: aPTT: 37 seconds — ABNORMAL HIGH (ref 24–36)

## 2022-02-15 LAB — CERULOPLASMIN: Ceruloplasmin: 16.3 mg/dL (ref 16.0–31.0)

## 2022-02-15 LAB — HEPATITIS B SURFACE ANTIBODY, QUANTITATIVE: Hep B S AB Quant (Post): 115.2 m[IU]/mL (ref >9.9)

## 2022-02-15 LAB — CYTOLOGY - NON PAP

## 2022-02-15 MED ORDER — PHENOBARBITAL SODIUM 65 MG/ML IJ SOLN: 32.4000 mg | Freq: Three times a day (TID) | INTRAMUSCULAR | Status: DC

## 2022-02-15 MED ORDER — LACTULOSE ENEMA
300.0000 mL | Freq: Four times a day (QID) | RECTAL | Status: DC
Start: 2022-02-15 — End: 2022-02-16
  Administered 2022-02-15 – 2022-02-16 (×4): 300 mL via RECTAL
  Filled 2022-02-15 (×6): qty 300

## 2022-02-15 MED ORDER — POTASSIUM CHLORIDE 10 MEQ/100ML IV SOLN
10.0000 meq | INTRAVENOUS | Status: AC
  Administered 2022-02-15 (×4): 10 meq via INTRAVENOUS
  Filled 2022-02-15 (×4): qty 100

## 2022-02-15 MED ORDER — VITAMIN K1 10 MG/ML IJ SOLN
10.0000 mg | Freq: Every day | INTRAVENOUS | Status: AC
  Administered 2022-02-15 – 2022-02-17 (×3): 10 mg via INTRAVENOUS
  Filled 2022-02-15 (×4): qty 1

## 2022-02-15 MED ORDER — LACTATED RINGERS IV BOLUS
1000.0000 mL | Freq: Once | INTRAVENOUS | Status: AC
  Administered 2022-02-15: 1000 mL via INTRAVENOUS

## 2022-02-15 MED ORDER — SODIUM CHLORIDE 0.9% FLUSH
10.0000 mL | Freq: Two times a day (BID) | INTRAVENOUS | Status: DC
  Administered 2022-02-15: 10:00:00 10 mL
  Administered 2022-02-15: 21:00:00 30 mL
  Administered 2022-02-15: 10 mL
  Administered 2022-02-16: 30 mL
  Administered 2022-02-16 – 2022-02-26 (×20): 10 mL

## 2022-02-15 MED ORDER — SODIUM CHLORIDE 0.9% FLUSH: 10.0000 mL | INTRAVENOUS | Status: DC | PRN

## 2022-02-15 MED ORDER — PHENOBARBITAL SODIUM 65 MG/ML IJ SOLN
64.8000 mg | Freq: Three times a day (TID) | INTRAMUSCULAR | Status: DC
  Administered 2022-02-15 – 2022-02-17 (×5): 64.8 mg via INTRAVENOUS
  Filled 2022-02-15 (×5): qty 1

## 2022-02-15 MED ORDER — SODIUM CHLORIDE 0.9 % IV SOLN
1.0000 g | INTRAVENOUS | Status: DC
  Administered 2022-02-15 – 2022-02-16 (×2): 1 g via INTRAVENOUS
  Filled 2022-02-15 (×2): qty 10

## 2022-02-15 NOTE — Progress Notes (Signed)
NAME:  Harold Howard, MRN:  [id number], DOB:  [birth year], LOS: 2 ADMISSION DATE:  01/26/2022, CONSULTATION DATE:  02/14/2022 REFERRING MD:  Gustavus Messing, CHIEF COMPLAINT:  hemorrhagic shock   History of Present Illness:  Harold Howard is a 41 y/o gentleman with a history of daily ETOH use who presents with 1 month of vomiting blood and dark stools consistent with upper GI bleed s/p EGD on 2/19 with banding of esophageal varices.   History is provided by the ED and his step sister as he is intubated and sedated. His family is estranged due to his ETOH consumption. His step-sister indicated she is the only family he is in contact with. He drinks about a fifth per day, but has been working on cutting back recently, but he still drinks about a half gallon of liquor every 2-3 days. He came home from work early yesterday after texting his roommate a picture of a toilet bowl full of BRB. He was found on the floor this morning screaming to call 911. No emesis with EMS. Received 500cc crystalloid and zofran. He was hypotensive and developed worsening encephalopathy in the ED and required intubation. He had a seizure in the ED.  He received 8 pRBCs. PCCM consulted for admission. GI consulted by the ED.  No known use of NSAIDs or anticoagulants. His step-sister is not clear on his medical history.  Pertinent  Medical History  ETOH abuse  Significant Hospital Events: Including procedures, antibiotic start and stop dates in addition to other pertinent events   2/19 admission, intubated sedated, EGD  2/20 paracentesis   Interim History / Subjective:  No acute events over night  Intubated 50 fentanyl, tired to wean off ventilator this morning and was not able to breath on own  Foley in place    Objective   Blood pressure (!) 103/52, pulse 97, temperature 99.1 F (37.3 C), temperature source Bladder, resp. rate (!) 25, height 5\' 9"  (1.753 m), weight 81.8 kg, SpO2 96 %.    Vent Mode: PRVC FiO2 (%):  [40 %]  40 % Set Rate:  [25 bmp] 25 bmp Vt Set:  [560 mL] 560 mL PEEP:  [5 cmH20] 5 cmH20 Plateau Pressure:  [12 cmH20-19 cmH20] 17 cmH20   Intake/Output Summary (Last 24 hours) at 02/15/2022 0752 Last data filed at 02/15/2022 0700 Gross per 24 hour  Intake 3091.83 ml  Output 2415 ml  Net 676.83 ml   Filed Weights   01/30/2022 0958 02/14/22 0500 02/15/22 0415  Weight: 76 kg 81 kg 81.8 kg   Vitals last 24 hours:  T 94.1 - 100.4 PR 88-104  RR 15-29 SBP 95-128 DBP 48-62 SpO2 92-100 on ventilator   Examination: General: critically ill appearing man intubated, sedated HENT: /AT, eyes anicteric, eyes anicteric Lungs: CTAB, synchronous with MV Cardiovascular: S1S2, tachycardic Abdomen: descended, NT Extremities: no peripheral edema, no cyanosis or clubbing Neuro: RASS -1, PERRL, following commands  Derm: no telangiectasias or spider angioma, no bruising  Labs Na 144 K 3.7    BUN stable at 41 Cr increased to 1.72 T bili 6.4  WBC down trending to 11 H/H 8.7/26  Liver enzyme elevation  AST  593 ALT 149   Peritoneal fluid cell count showed 55 % neutrophil count (less than 250 neutrophils and total cell count less than 500 therefore not SBP)   GI lab orders wnl:  Hepatitis panel non reactive.  Ferritin 22 Ceuloplasmnin 16.3  Gamma GT 33 Alpha 1 antitrypsin 164 AFP tumor marker  3.4   Images  2/19 RUQ [place]: cirrhotic hepatic morphology, abdominopelvic ascites, no gallstones or biliary dilation, bidirectional flow is suspected in the main portal vein with varicosities at the hepatic hilum   Procedures:  2/19 EGD: EGD yesterday with large esophageal varices, suspected gastric varices, and portal hypertensive gastropathy   Antibiotics:  2/19 Ceftriaxone for SBP prophylaxis   Resolved Hospital Problem list   Hypocalcemia   Assessment & Plan:  Hemorrhagic shock Acute on subacute GIB secondary to alcohol use disorder Acute blood loss anemia  Esophageal Varices s/p banding   Hemodynamically stable and hemoglobin 8.7 on 2/21.  -Transfuse to maintain Hb >7 and hemodynamic stability -Continue IV octreotide for 3 days d/c 2/22 -Continue pantoprazole IV BID  -Continue Ceftriaxone for SBP prophylaxis for 5 days initiated on 2/19  -Recommend ETOH cessation; can counsel when appropriate -Hold chemical DVT prophylaxis -Appreciate GI's assistance  Acute respiratory failure with hypoxia -LTVV, 4-8cc/kg IBW with goal Pplat<30  -VAP prevention protocol -PAD protocol for sedation -daily SAT & SBT when stable  Decompensated cirrhosis  Ascites  Hyperbilirubinemia and elevated transaminase due to hypoperfusion, ETOH abuse. 2/19 RUQ [place]: cirrhotic hepatic morphology, abdominopelvic ascites, no gallstones or biliary dilation, bidirectional flow is suspected in the main portal vein with varicosities at the hepatic hilum. MELD score 24. Paracentesis on 2/20 not consistent with SBP. SAAG is 1 g/dl portal hypertension may not be cause of ascites. -Peritoneal fluid culture pending.   -Maintain adequate perfusion -Outpatient GI follow up  -ETOH cessation   -Mitochondrial antibodies, reticulin antibody, tissue transglutaminase, gliadin, anti-smooth muscle, ANA pending   AKI Concern for hepatorenal syndrome.  Creatinine increased to 1.72. Fluid bolus.   -maintain adequate perfusion -strict I/Os -renally dose meds, avoid nephrotoxic meds  Severe metabolic acidosis likely due to shock Improving. Lactic acid normalized to 1.2.  -monitor -may need bicarb gtt if not correcting   Hypokalemia  K of 3.7 on 2/20  - Replete with goal K > 4   Prolonged Qtc  5/19 Qtc 511. Not currently on QT prolonging medications.  - Avoid high risk QT prolonging medications - Continue to monitor   Seizure, likely due to profound hypoperfusion and hypoxia Head CT on 2/19 showed no acute intracranial pathology.  -if recurrent, add keppra -ativan PRN -seizure precautions  ETOH  abuse -recommend cessation -vitamins when able to take PO -monitor for withdrawal  Best Practice (right click and "Reselect all SmartList Selections" daily)   Diet/type: NPO DVT prophylaxis: SCD GI prophylaxis: PPI Lines: Central line and Arterial Line Foley:  Yes, and it is still needed Code Status:  full code Last date of multidisciplinary goals of care discussion [ with step sister on 2/19]  Labs   CBC: Recent Labs  Lab 02/14/22 0115 02/14/22 0550 02/14/22 1333 02/14/22 1817 02/15/22 0332  WBC 13.4* 12.5* 10.2 9.3 11.0*  NEUTROABS  --  10.0*  --  7.3 8.9*  HGB 8.0* 8.1* 8.4* 8.2* 8.7*  HCT 23.5* 23.9* 24.0* 24.6* 26.3*  MCV 84.5 84.5 85.1 86.6 87.7  PLT 49*   49* 50*   51* 51*   55* 53* 66*    Basic Metabolic Panel: Recent Labs  Lab 02/16/2022 1135 02/19/2022 1315 02/14/2022 1440 02/06/2022 1653 02/17/2022 1703 02/14/22 0550 02/14/22 1120 02/15/22 0332  NA 136   < > 137  --  140 142 140 144  K 4.6   < > 3.6  --  3.2* 3.1* 3.1* 3.7  CL 100  --   --   --   --  107 108 109  CO2 <7*  --   --   --   --  25 24 25   GLUCOSE 111*  --   --   --   --  115* 115* 140*  BUN 29*  --   --   --   --  29* 31* 41*  CREATININE 1.86*  --   --   --   --  1.37* 1.40* 1.72*  CALCIUM 8.2*  --   --   --   --  8.1* 8.0* 8.2*  MG  --   --   --  1.6*  --   --   --  2.3  PHOS  --   --   --  4.8*  --   --   --  4.6   < > = values in this interval not displayed.   GFR: Estimated Creatinine Clearance: 57.1 mL/min (A) (by C-G formula based on SCr of 1.72 mg/dL (H)). Recent Labs  Lab 02/07/2022 1304 02/01/2022 1504 01/28/2022 1653 02/14/22 0550 02/14/22 1333 02/14/22 1516 02/14/22 1817 02/15/22 0332  WBC  --   --    < > 12.5* 10.2  --  9.3 11.0*  LATICACIDVEN >9.0* >9.0*  --   --   --  1.2  --   --    < > = values in this interval not displayed.    Liver Function Tests: Recent Labs  Lab 01/27/2022 1135 02/14/22 0550 02/15/22 0332  AST 86* 636* 593*  ALT <5 108* 149*  ALKPHOS 80 73 87   BILITOT 4.1* 7.3* 6.4*  PROT 4.1* 5.0* 5.5*  ALBUMIN 1.6* 2.4* 2.5*   No results for input(s): LIPASE, AMYLASE in the last 168 hours. Recent Labs  Lab 02/18/2022 1304  AMMONIA 144*    ABG    Component Value Date/Time   PHART 7.456 (H) 02/19/2022 1703   PCO2ART 21.0 (L) 02/14/2022 1703   PO2ART 115 (H) 02/02/2022 1703   HCO3 14.9 (L) 02/02/2022 1703   TCO2 16 (L) 02/17/2022 1703   ACIDBASEDEF 8.0 (H) 02/10/2022 1703   O2SAT 99 01/31/2022 1703     Coagulation Profile: Recent Labs  Lab 02/15/2022 1915 02/14/22 0115 02/14/22 0550 02/14/22 1333 02/15/22 0332  INR 2.0* 2.2* 2.0* 2.1* 2.2*    Cardiac Enzymes: No results for input(s): CKTOTAL, CKMB, CKMBINDEX, TROPONINI in the last 168 hours.  HbA1C: No results found for: HGBA1C  CBG: Recent Labs  Lab 02/14/22 1608 02/14/22 1941 02/14/22 2351 02/15/22 0341 02/15/22 0723  GLUCAP 116* 127* 131* 125* 133*

## 2022-02-15 NOTE — Progress Notes (Signed)
Initial Nutrition Assessment  DOCUMENTATION CODES:   Non-severe (moderate) malnutrition in context of social or environmental circumstances  INTERVENTION:   If able to obtain enteral access, recommend initiation of tube feeds: - Start Vital 1.5 @ 20 ml/hr and advance by 10 ml q 6 hours to goal rate of 60 ml/hr (1440 ml/day) - ProSource TF 45 ml daily  Recommended tube feeding regimen would provide 2200 kcal, 108 grams of protein, and 1100 ml of H2O.   - Recommend MVI with minerals daily once able to obtain enteral access/take POs  NUTRITION DIAGNOSIS:   Moderate Malnutrition related to social / environmental circumstances (EtOH abuse) as evidenced by mild fat depletion, moderate muscle depletion.  GOAL:   Patient will meet greater than or equal to 90% of their needs  MONITOR:   Vent status, Labs, Weight trends, I & O's  REASON FOR ASSESSMENT:   Ventilator    ASSESSMENT:   41 year old male who presented to the ED on 2/19 with hematemesis and dark stools x 1 month. Pt had a seizure in the ED and required intubation for airway protection. PMH of EtOH abuse. Pt admitted with hemorrhagic shock, GI bleed, AKI.  02/19 - s/p EGD showing large esophageal varices with stigmata of recent bleeding s/p banding x 11, reflux esophagitis, portal hypertensive gastropathy 02/20 - s/p paracentesis   Discussed pt with RN and during ICU rounds. Pt with new diagnosis of decompensated cirrhosis. OG tube removed yesterday during EGD procedure. Pt without enteral access at this time and GI would not like to attempt to obtain enteral access given multiple large esophageal varices. Per CCM, possible extubation tomorrow. RD will leave tube feeding recommendations for use if able to obtain enteral access.  Per notes, pt drinks about a fifth of liquor a day and has been working on cutting back recently.  Spoke with significant other at bedside who reports pt has had a decreased appetite for several  months. She states that pt typically eats 1-2 real meals daily. 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 Pt eats a large breakfast meal with his family on the weekends.  Pt's significant other reports pt's weight has been stable recently. She has not noticed him gaining or losing weight. She is unsure of his UBW. Weight history in chart is limited but admit weight of 76 kg is consistent weight weight of 74.1 kg from 11/15/18.  Admit weight: 76 kg Current weight: 81.8 kg  Patient is currently intubated on ventilator support MV: 13.6 L/min Temp (24hrs), Avg:99.7 F (37.6 C), Min:98.6 F (37 C), Max:100.4 F (38 C) BP (a-line): 109/57 MAP (a-line): 74  Drips: Octreotide: 25 ml/hr  Medications reviewed and include: lactulose enemas, IV protonix, IV thiamine, IV phenobarbital taper, IV abx, IV vitamin K 10 mg daily  Labs reviewed: BUN 41, creatinine 1.72, elevated LFTs, hemoglobin 8.7, WBC 11.0 CBG's: 116-133 x 24 hours  UOP: 665 ml x 24 hours Rectal tube: 1750 ml x 24 hours I/O's: +3.2 L since admit  NUTRITION - FOCUSED PHYSICAL EXAM:  Flowsheet Row Most Recent Value  Orbital Region Mild depletion  Upper Arm Region Mild depletion  Thoracic and Lumbar Region Mild depletion  Buccal Region Unable to assess  Temple Region Mild depletion  Clavicle Bone Region Moderate depletion  Clavicle and Acromion Bone Region Moderate depletion  Scapular Bone Region Unable to assess  Dorsal Hand Unable to assess  Patellar Region  Mild depletion  Anterior Thigh Region Mild depletion  Posterior Calf Region Mild depletion  Edema (RD Assessment) Mild  Hair Reviewed  Eyes Reviewed  Mouth Unable to assess  Skin Reviewed  Nails Reviewed       Diet Order:   Diet Order             Diet NPO time specified  Diet effective now                   EDUCATION NEEDS:   Not appropriate for education at this  time  Skin:  Skin Assessment: Reviewed RN Assessment  Last BM:  02/15/22 rectal tube  Height:   Ht Readings from Last 1 Encounters:  02/01/2022 5\' 9"  (1.753 m)    Weight:   Wt Readings from Last 1 Encounters:  02/15/22 81.8 kg    BMI:  Body mass index is 26.63 kg/m.  Estimated Nutritional Needs:   Kcal:  2100-2300  Protein:  105-120 grams  Fluid:  2.0 L/day    Gustavus Bryant, MS, RD, LDN Inpatient Clinical Dietitian Please see AMiON for contact information.

## 2022-02-16 DIAGNOSIS — E440 Moderate protein-calorie malnutrition: Secondary | ICD-10-CM | POA: Insufficient documentation

## 2022-02-16 LAB — POCT I-STAT 7, (LYTES, BLD GAS, ICA,H+H)
Acid-Base Excess: 1 mmol/L (ref 0.0–2.0)
Bicarbonate: 26 mmol/L (ref 20.0–28.0)
Calcium, Ion: 1.24 mmol/L (ref 1.15–1.40)
HCT: 26 % — ABNORMAL LOW (ref 39.0–52.0)
Hemoglobin: 8.8 g/dL — ABNORMAL LOW (ref 13.0–17.0)
O2 Saturation: 98 %
Patient temperature: 98.6
Potassium: 3.8 mmol/L (ref 3.5–5.1)
Sodium: 146 mmol/L — ABNORMAL HIGH (ref 135–145)
TCO2: 27 mmol/L (ref 22–32)
pCO2 arterial: 39.7 mmHg (ref 32–48)
pH, Arterial: 7.424 (ref 7.35–7.45)
pO2, Arterial: 97 mmHg (ref 83–108)

## 2022-02-16 LAB — COMPREHENSIVE METABOLIC PANEL
ALT: 125 U/L — ABNORMAL HIGH (ref 0–44)
AST: 310 U/L — ABNORMAL HIGH (ref 15–41)
Albumin: 2.4 g/dL — ABNORMAL LOW (ref 3.5–5.0)
Alkaline Phosphatase: 81 U/L (ref 38–126)
Anion gap: 6 (ref 5–15)
BUN: 41 mg/dL — ABNORMAL HIGH (ref 6–20)
CO2: 25 mmol/L (ref 22–32)
Calcium: 8.3 mg/dL — ABNORMAL LOW (ref 8.9–10.3)
Chloride: 113 mmol/L — ABNORMAL HIGH (ref 98–111)
Creatinine, Ser: 1.45 mg/dL — ABNORMAL HIGH (ref 0.61–1.24)
GFR, Estimated: >60 mL/min (ref >60)
Glucose, Bld: 153 mg/dL — ABNORMAL HIGH (ref 70–99)
Potassium: 3.8 mmol/L (ref 3.5–5.1)
Sodium: 144 mmol/L (ref 135–145)
Total Bilirubin: 6.1 mg/dL — ABNORMAL HIGH (ref 0.3–1.2)
Total Protein: 5.4 g/dL — ABNORMAL LOW (ref 6.5–8.1)

## 2022-02-16 LAB — CBC WITH DIFFERENTIAL/PLATELET
Abs Immature Granulocytes: 0.08 K/uL — ABNORMAL HIGH (ref 0.00–0.07)
Basophils Absolute: 0 K/uL (ref 0.0–0.1)
Basophils Relative: 0 %
Eosinophils Absolute: 0 K/uL (ref 0.0–0.5)
Eosinophils Relative: 0 %
HCT: 27.4 % — ABNORMAL LOW (ref 39.0–52.0)
Hemoglobin: 8.9 g/dL — ABNORMAL LOW (ref 13.0–17.0)
Immature Granulocytes: 1 %
Lymphocytes Relative: 7 %
Lymphs Abs: 0.7 K/uL (ref 0.7–4.0)
MCH: 29.2 pg (ref 26.0–34.0)
MCHC: 32.5 g/dL (ref 30.0–36.0)
MCV: 89.8 fL (ref 80.0–100.0)
Monocytes Absolute: 1.3 K/uL — ABNORMAL HIGH (ref 0.1–1.0)
Monocytes Relative: 14 %
Neutro Abs: 7.5 K/uL (ref 1.7–7.7)
Neutrophils Relative %: 78 %
Platelets: 80 K/uL — ABNORMAL LOW (ref 150–400)
RBC: 3.05 MIL/uL — ABNORMAL LOW (ref 4.22–5.81)
RDW: 18.6 % — ABNORMAL HIGH (ref 11.5–15.5)
WBC: 9.6 K/uL (ref 4.0–10.5)
nRBC: 0 % (ref 0.0–0.2)

## 2022-02-16 LAB — GLUCOSE, CAPILLARY
Glucose-Capillary: 156 mg/dL — ABNORMAL HIGH (ref 70–99)
Glucose-Capillary: 47 mg/dL — ABNORMAL LOW (ref 70–99)
Glucose-Capillary: 150 mg/dL — ABNORMAL HIGH (ref 70–99)
Glucose-Capillary: 137 mg/dL — ABNORMAL HIGH (ref 70–99)
Glucose-Capillary: 132 mg/dL — ABNORMAL HIGH (ref 70–99)
Glucose-Capillary: 117 mg/dL — ABNORMAL HIGH (ref 70–99)
Glucose-Capillary: 128 mg/dL — ABNORMAL HIGH (ref 70–99)

## 2022-02-16 LAB — RETICULOCYTES
Immature Retic Fract: 36.5 % — ABNORMAL HIGH (ref 2.3–15.9)
RBC.: 3.16 MIL/uL — ABNORMAL LOW (ref 4.22–5.81)
Retic Count, Absolute: 214.2 10*3/uL — ABNORMAL HIGH (ref 19.0–186.0)
Retic Ct Pct: 6.8 % — ABNORMAL HIGH (ref 0.4–3.1)

## 2022-02-16 LAB — AMMONIA: Ammonia: 52 umol/L — ABNORMAL HIGH (ref 9–35)

## 2022-02-16 LAB — FERRITIN: Ferritin: 24 ng/mL (ref 24–336)

## 2022-02-16 LAB — PHOSPHORUS: Phosphorus: 3.5 mg/dL (ref 2.5–4.6)

## 2022-02-16 LAB — MAGNESIUM: Magnesium: 2.3 mg/dL (ref 1.7–2.4)

## 2022-02-16 LAB — IRON AND TIBC
Iron: 21 ug/dL — ABNORMAL LOW (ref 45–182)
Saturation Ratios: 8 % — ABNORMAL LOW (ref 17.9–39.5)
TIBC: 249 ug/dL — ABNORMAL LOW (ref 250–450)
UIBC: 228 ug/dL

## 2022-02-16 LAB — FOLATE: Folate: 6.2 ng/mL (ref >5.9)

## 2022-02-16 LAB — VITAMIN B12: Vitamin B-12: 1457 pg/mL — ABNORMAL HIGH (ref 180–914)

## 2022-02-16 LAB — RETICULIN ANTIBODIES, IGA W TITER: Reticulin Ab, IgA: NEGATIVE titer (ref <2.5)

## 2022-02-16 LAB — PROTIME-INR
INR: 2 — ABNORMAL HIGH (ref 0.8–1.2)
Prothrombin Time: 22.6 seconds — ABNORMAL HIGH (ref 11.4–15.2)

## 2022-02-16 LAB — APTT: aPTT: 34 seconds (ref 24–36)

## 2022-02-16 MED ORDER — POLYSACCHARIDE IRON COMPLEX 150 MG PO CAPS
150.0000 mg | ORAL_CAPSULE | Freq: Every day | ORAL | Status: DC
  Administered 2022-02-17 – 2022-02-20 (×4): 150 mg via ORAL
  Filled 2022-02-16 (×6): qty 1

## 2022-02-16 MED ORDER — PANTOPRAZOLE SODIUM 40 MG IV SOLR
40.0000 mg | Freq: Two times a day (BID) | INTRAVENOUS | Status: DC
  Administered 2022-02-16 – 2022-02-17 (×2): 40 mg via INTRAVENOUS
  Filled 2022-02-16 (×2): qty 10

## 2022-02-16 MED ORDER — POLYSACCHARIDE IRON COMPLEX 150 MG PO CAPS
150.0000 mg | ORAL_CAPSULE | Freq: Every day | ORAL | Status: DC
  Filled 2022-02-16: qty 1

## 2022-02-16 MED ORDER — LACTULOSE ENEMA
300.0000 mL | Freq: Three times a day (TID) | ORAL | Status: DC
  Administered 2022-02-16 – 2022-02-17 (×3): 300 mL via RECTAL
  Filled 2022-02-16 (×4): qty 300

## 2022-02-16 MED ORDER — PANTOPRAZOLE SODIUM 40 MG PO TBEC: 40.0000 mg | DELAYED_RELEASE_TABLET | Freq: Two times a day (BID) | ORAL | Status: DC

## 2022-02-16 MED ORDER — SODIUM CHLORIDE 0.9 % IV SOLN: INTRAVENOUS | Status: DC | PRN

## 2022-02-16 MED ORDER — HEPARIN SODIUM (PORCINE) 5000 UNIT/ML IJ SOLN
5000.0000 [IU] | Freq: Three times a day (TID) | INTRAMUSCULAR | Status: DC
  Administered 2022-02-16 – 2022-02-21 (×15): 5000 [IU] via SUBCUTANEOUS
  Filled 2022-02-16 (×15): qty 1

## 2022-02-16 MED ORDER — ORAL CARE MOUTH RINSE
15.0000 mL | Freq: Two times a day (BID) | OROMUCOSAL | Status: DC
  Administered 2022-02-16 – 2022-02-17 (×3): 15 mL via OROMUCOSAL

## 2022-02-16 NOTE — Progress Notes (Signed)
eLink Physician-Brief Progress Note Patient Name: Harold Howard DOB: 04/11/1981 MRN: [id number]   Date of Service  02/16/2022  HPI/Events of Note  Nursing request for AM labs.   eICU Interventions  Plan: CBC with platelets, CMP and Ammonia level at 5 AM/     Intervention Category Major Interventions: Other:  Lenell Antu 02/16/2022, 9:54 PM

## 2022-02-16 NOTE — Progress Notes (Signed)
Patient self extubated with patient safety mits on. Patient placed on 4L Nasal canula, O2 saturation 93%-95%. CCM MD at bedside.

## 2022-02-16 NOTE — Progress Notes (Addendum)
Daily Rounding Note  02/16/2022, 1:15 PM  LOS: 3 days   SUBJECTIVE:   Chief complaint: Decompensated cirrhosis.  Variceal bleed.  Blood loss anemia.  Remains hemodynamically stable.  Extubated.  Sats mid to upper 90 percentile on 4 L [place] oxygen.  C/O "cotton mouth", dry mouth.  NPO.   Watery stool per rectal collection system.  Flexiseal output previous 2 days: 1.75 L... 1.5 L.   Abdominal distention w/O nausea or pain   OBJECTIVE:         Vital signs in last 24 hours:    Temp:  [97.5 F (36.4 C)-98.6 F (37 C)] 98.4 F (36.9 C) (02/22 1134) Pulse Rate:  [73-96] 83 (02/22 1200) Resp:  [7-26] 19 (02/22 1200) BP: (120-129)/(65-69) 123/67 (02/22 1200) SpO2:  [91 %-97 %] 93 % (02/22 1200) Arterial Line BP: (108-149)/(55-78) 132/68 (02/22 1200) FiO2 (%):  [40 %] 40 % (02/21 2356) Weight:  [81 kg] 81 kg (02/22 0500) Last BM Date : 02/16/22 Filed Weights   02/14/22 0500 02/15/22 0415 02/16/22 0500  Weight: 81 kg 81.8 kg 81 kg   General: looks ill.  Confused but alert  Heart: RRR Chest: no labored breathing or cough.  Clear bil Abdomen: distnded, tense.  Tympanitic BS,  watery clear liquid in flexiseal, some floating brown stool.  No melena.   Extremities: slight LE edema Neuro/Psych:  Safety mits in place.  Confused but alert, not oriented to year, place and kept answering incorrect year to all questions (such as what he does for a job).  Follows simple commands, but needs prodding at times  ? Asterixis.  Pinpoint pupils.    Intake/Output from previous day: 02/21 0701 - 02/22 0700 In: 3274.8 [I.V.:626.7; IV Piggyback:1448.1] Out: 2700 [Urine:1175; QXIHW:3888]  Intake/Output this shift: Total I/O In: 515 [I.V.:165; Other:300; IV Piggyback:50] Out: 690 [Urine:390; KCMKL:491]  Lab Results: Recent Labs    02/15/22 0332 02/15/22 1559 02/16/22 0234 02/16/22 0318  WBC 11.0* 10.8*  --  9.6  HGB 8.7* 8.7* 8.8* 8.9*   HCT 26.3* 26.4* 26.0* 27.4*  PLT 66* 77*  --  80*   BMET Recent Labs    02/14/22 1120 02/15/22 0332 02/16/22 0234 02/16/22 0318  NA 140 144 146* 144  K 3.1* 3.7 3.8 3.8  CL 108 109  --  113*  CO2 24 25  --  25  GLUCOSE 115* 140*  --  153*  BUN 31* 41*  --  41*  CREATININE 1.40* 1.72*  --  1.45*  CALCIUM 8.0* 8.2*  --  8.3*   LFT Recent Labs    02/14/22 0550 02/15/22 0332 02/16/22 0318  PROT 5.0* 5.5* 5.4*  ALBUMIN 2.4* 2.5* 2.4*  AST 636* 593* 310*  ALT 108* 149* 125*  ALKPHOS 73 87 81  BILITOT 7.3* 6.4* 6.1*   PT/INR Recent Labs    02/15/22 0332 02/16/22 0318  LABPROT 24.1* 22.6*  INR 2.2* 2.0*   Hepatitis Panel Recent Labs    02/14/22 1632  HEPBSAG NON REACTIVE  HCVAB NON REACTIVE  HEPAIGM NON REACTIVE  HEPBIGM NON REACTIVE    Studies/Results: No results found.  Scheduled Meds:  chlorhexidine gluconate (MEDLINE KIT)  15 mL Mouth Rinse BID   Chlorhexidine Gluconate Cloth  6 each Topical Daily   heparin injection (subcutaneous)  5,000 Units Subcutaneous Q8H   [START ON 02/17/2022] iron polysaccharides  150 mg Oral Daily   lactulose  300 mL Rectal Q6H  mouth rinse  15 mL Mouth Rinse BID   pantoprazole  40 mg Intravenous Q12H   PHENObarbital  64.8 mg Intravenous Q8H   Followed by   [START ON 02/17/2022] PHENObarbital  32.4 mg Intravenous Q8H   sodium chloride flush  10-40 mL Intracatheter Q12H   thiamine injection  100 mg Intravenous Daily   Continuous Infusions:  sodium chloride 10 mL/hr at 02/16/22 1200   cefTRIAXone (ROCEPHIN)  IV Stopped (02/15/22 1845)   phytonadione (VITAMIN K) IV Stopped (02/16/22 1125)   PRN Meds:.sodium chloride, albuterol, sodium chloride flush   ASSESMENT:   Hematemesis w hemorrhagic shock..  02/13/2021 EGD with large esophageal varices with stigmata of bleeding.  11 bands placed.  Reflux esophagitis.  Portal hypertensive gastropathy.  Suspicion for gastric varices without stigmata of bleeding.  Lactic acidosis  resolved.  Completed 72 h of Sandostatin and PPI drip.  Now on Protonix 40 mg IV bid.       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  Suspect ETOH hepatitis and element of shock liver.  LFTs better.  Ultrasound confirms cirrhosis, no focal lesion.  Bidirectional flow at main portal vein and varicosities at the hilum.  Ceruloplasmin, alpha-1 antitrypsin normal, Gliadin IgG, smooth muscle Abs,  mitochondrial AB IgG, TTG IgA: all normal.  Antigliadin antibody IgA elevated 33.Marland Kitchen  Ferritin low at 22.  AFP normal.  ANA pndg.   MELD was 25 near admission.       Blood loss anemia.  Low ferritin.  Treated at admission with massive transfusion protocol including PRBCs (at least 8 units), FFP, platelets.  Hgb 1.9 .... 8.9.  Dr Tacy Learn added po iron as of today (caveat still NPO).    Coagulopathy.  INR 2.  IV Vit K today.      Seizure in ED, negative CT.  May be related to ETOH withdrawal as pt "cut back" on ETOH in days leading up to admission.    Ascites, seen on ultrasound.  2/20 bedside paracentesis, 40 cc's, negative for SBP.  On Rocephin for SBP prophylaxis.  Day 4.       Thrombocytopenia.  Platelets 80 today, up from 49.     AKI, improving.  Normal GFR currently.  Elevated LFTs.  Suspect alcoholic hepatitis as well as liver hypoperfusion/shock.    Encephalopathy.     PLAN     ? Add non-selective BB?  ?if/when to add diuretics? Switch to oral PPPI tomorrow if approved for po intake.   DC Lactulose PR.  Switch to oral if possible.  ? Add Rifaximin.   Ok to begin feeding.  Prefer oral if safe.  Dietician note of 2/21 rec is Vital 1.5, goal 60 mL/daily.  Messaged CCM attending and Med student to inquire about PO.  DC rocephin after day 5?       Azucena Freed  02/16/2022, 1:15 PM Phone [phone]     Attending physician's note   I have reviewed the chart and examined the patient. I performed a substantive portion of this encounter, including complete performance of at least one of the key components, in  conjunction with the APP. I agree with the Advanced Practitioner's note, impression and recommendations.   Variceal bleed s/p EVL x 11 (2/18) after massive transfusion protocol. No further bleeding. Hb stable.  Extubated. Off octreotide.  EtOH liver cirrhosis with portal hypertension.  Neg acute hep panel, AMA, ASMA. Nl AFP. Immune to hep A/B. Neg metabolic WU so far and autoimmune WU.  Ascites. No SBP  Hepatic encephalopathy  Mild ARF (doubt HRS)  Not safe for PO intake per speech yet d/t lethargy  Plan: -Resume lactulose enemas. -Once able to take p.o., start p.o. lactulose/rifaximin/coreg -Strict alcohol abstinence. -Consider adding midodrine -D/W family and significant other by bedside.   Carmell Austria, MD [facility] [phone]

## 2022-02-16 NOTE — TOC Initial Note (Signed)
Transition of Care [facility]) - Initial/Assessment Note    Patient Details  Name: Harold Howard MRN: [id number] Date of Birth: November 24, 1981  [facility] CM/SW Contact:    Tom-Johnson, Hershal Coria, RN Phone Number: 02/16/2022, 3:15 PM  Clinical Narrative:                  CM spoke with patient and significant other, Kelly at bedside. Patient is admitted for Upper GI bleed. Was intubated and self extubated yesterday, 02/21. Currently on $L oxygen. From home with two room mates and two out of his five children. Currently employed at [employer] and has [employer]. Tresa Endo to bring in [employer] to Engineer, mining. Independent with care and drive self prior to admission. States PCP is with [facility] and uses Psychologist, forensic at [facility]. No recommendations noted at this time. CM will continue to follow with needs.     Barriers to Discharge: Continued Medical Work up   Patient Goals and CMS Choice Patient states their goals for this hospitalization and ongoing recovery are:: To return home CMS Medicare.gov Compare Post Acute Care list provided to:: Patient Choice offered to / list presented to : Patient, Spouse (Significant other)  Expected Discharge Plan and Services     Discharge Planning Services: CM Consult   Living arrangements for the past 2 months: Single Family Home                                      Prior Living Arrangements/Services Living arrangements for the past 2 months: Single Family Home Lives with:: Roommate, Minor Children Patient language and need for interpreter reviewed:: Yes Do you feel safe going back to the place where you live?: Yes      Need for Family Participation in Patient Care: Yes (Comment) Care giver support system in place?: Yes (comment)   Criminal Activity/Legal Involvement Pertinent to Current Situation/Hospitalization: No - Comment as needed  Activities of Daily Living      Permission  Sought/Granted Permission sought to share information with : Case Manager, Magazine features editor, Family Supports Permission granted to share information with : Yes, Verbal Permission Granted              Emotional Assessment Appearance:: Appears stated age Attitude/Demeanor/Rapport: Engaged, Gracious Affect (typically observed): Accepting, Appropriate, Calm, Hopeful Orientation: : Oriented to Self, Oriented to Place, Oriented to Situation Alcohol / Substance Use: Alcohol Use Psych Involvement: No (comment)  Admission diagnosis:  Respiratory failure (HCC) [J96.90] Hyperbilirubinemia [E80.6] ETOH abuse [F10.10] GI bleed [K92.2] Patient Active Problem List   Diagnosis Date Noted   Malnutrition of moderate degree 02/16/2022   Upper GI bleed 02/20/2022   ETOH abuse    Hypovolemic shock (HCC)    Bleeding esophageal varices (HCC)    DYSPNEA 06/10/2008   PCP:  Patient, No Pcp Per (Inactive) Pharmacy:   [id number] [place],  - [address] AT [facility] [address]  [place] [address] Phone: [phone] Fax: [phone]     Social Determinants of Health (SDOH) Interventions    Readmission Risk Interventions No flowsheet data found.

## 2022-02-16 NOTE — Progress Notes (Signed)
NAME:  Harold Howard, MRN:  [id number], DOB:  May 14, 1981, LOS: 3 ADMISSION DATE:  01/29/2022, CONSULTATION DATE:  02/11/2022 REFERRING MD:  Voncille Lo, CHIEF COMPLAINT:  hemorrhagic shock   History of Present Illness:  Mr. Schull is a 41 y/o gentleman with a history of daily ETOH use who presents with 1 month of vomiting blood and dark stools consistent with upper GI bleed s/p EGD on 2/19 with banding of esophageal varices.   History is provided by the ED and his step sister as he is intubated and sedated. His family is estranged due to his ETOH consumption. His step-sister indicated she is the only family he is in contact with. He drinks about a fifth per day, but has been working on cutting back recently, but he still drinks about a half gallon of liquor every 2-3 days. He came home from work early yesterday after texting his roommate a picture of a toilet bowl full of BRB. He was found on the floor this morning screaming to call 911. No emesis with EMS. Received 500cc crystalloid and zofran. He was hypotensive and developed worsening encephalopathy in the ED and required intubation. He had a seizure in the ED.  He received 8 pRBCs. PCCM consulted for admission. GI consulted by the ED.  No known use of NSAIDs or anticoagulants. His step-sister is not clear on his medical history.  Pertinent  Medical History  ETOH abuse  Significant Hospital Events: Including procedures, antibiotic start and stop dates in addition to other pertinent events   2/19 admission, intubated sedated, EGD  2/20 paracentesis   Interim History / Subjective:  Self extubated overnight  4L [place] O2 91-97   Objective   Blood pressure 126/67, pulse 85, temperature (!) 97.5 F (36.4 C), resp. rate (!) 9, height 5\' 9"  (1.753 m), weight 81 kg, SpO2 96 %.    Vent Mode: PRVC FiO2 (%):  [40 %] 40 % Set Rate:  [25 bmp] 25 bmp Vt Set:  [560 mL] 560 mL PEEP:  [5 cmH20] 5 cmH20 Plateau Pressure:  [18 cmH20-19 cmH20] 18 cmH20    Intake/Output Summary (Last 24 hours) at 02/16/2022 0806 Last data filed at 02/16/2022 0700 Gross per 24 hour  Intake 2939.83 ml  Output 2640 ml  Net 299.83 ml   Filed Weights   02/14/22 0500 02/15/22 0415 02/16/22 0500  Weight: 81 kg 81.8 kg 81 kg   Vitals last 24 hours:  97.5-98.6  PR 73-99 RR 7-30  SBP  DBP  SpO2 91-97 4L [place]   Examination: General: critically ill appearing man HENT: [place]/AT, eyes anicteric Lungs: CTAB, no accessory muscle use  Cardiovascular: S1S2, tachycardic Abdomen: descended, NT Extremities: no peripheral edema, no cyanosis or clubbing Neuro: RASS -1, PERRL, drowsy able to follow a few commands  Derm: no telangiectasias or spider angioma, no bruising  Labs Na 144 K 3.8   BUN stable at 41 Cr improved from 1.72 to 1.45  T bili 6.4  WBC normalized to 9.6  H/H 8.9/27   Liver enzyme elevation - improving  AST  593 to 310 ALT 149 to 125  Mitochondrial antibodies wnl  reticulin antibody wnl  tissue transglutaminase wnl  Gliadin wnl  anti-smooth muscle wnl   Images  2/19 RUQ [place]: cirrhotic hepatic morphology, abdominopelvic ascites, no gallstones or biliary dilation, bidirectional flow is suspected in the main portal vein with varicosities at the hepatic hilum   Procedures:  2/19 EGD: EGD yesterday with large esophageal varices, suspected gastric varices,  and portal hypertensive gastropathy   Antibiotics:  2/19 Ceftriaxone for SBP prophylaxis   Resolved Hospital Problem list   Hypocalcemia  Severe metabolic acidosis  Assessment & Plan:  Hemorrhagic shock, improved Acute on subacute GIB secondary to alcohol use disorder Acute blood loss anemia, improved  Esophageal Varices s/p banding  Hemodynamically stable and hemoglobin stable.  -Transfuse to maintain Hb >7 and hemodynamic stability -Discontinue IV octreotide  -Continue pantoprazole IV BID  -Continue Ceftriaxone for SBP prophylaxis for 5 days initiated on 2/19  -Recommend ETOH  cessation; can counsel when appropriate -Appreciate GI's assistance - Speech evaluation  - Iron supplementation    Decompensated cirrhosis  Ascites  Hyperammoniemia  Ammonia downtrending to 52. Hyperbilirubinemia and elevated transaminase due to hypoperfusion, ETOH abuse. 2/19 RUQ [place]: cirrhotic hepatic morphology, abdominopelvic ascites, no gallstones or biliary dilation, bidirectional flow is suspected in the main portal vein with varicosities at the hepatic hilum. MELD score 24. Paracentesis on 2/20 not consistent with SBP. SAAG is 1 g/dl. Liver work up negative thus far and etiology of cirrhosis is likely secondary to alcohol use. Peritoneal fluid culture showed no growth.  -Maintain adequate perfusion -Continue lactulose  -Outpatient GI follow up  -ETOH cessation   -ANA pending    Acute respiratory failure with hypoxia Improved. Self-extubated, vitals stable and no accessory muscle use. - Continue [place] with SpO2 > 88%   Hypokalemia, improved  K of 3.8  - Replete with goal K > 4   AKI Creatinine improved with fluid bolus. Concern for hepatorenal syndrome.  -maintain adequate perfusion -strict I/Os -renally dose meds, avoid nephrotoxic meds  Severe metabolic acidosis likely due to shock Resolved. Lactic acid normalized to 1.2.   Prolonged Qtc  5/19 Qtc 511. Not currently on QT prolonging medications.  - Avoid high risk QT prolonging medications - Continue to monitor   Seizure, likely due to profound hypoperfusion and hypoxia Head CT on 2/19 showed no acute intracranial pathology.  -if recurrent, add keppra -ativan PRN -seizure precautions  ETOH abuse -recommend cessation -vitamins when able to take PO -monitor for withdrawal  Best Practice (right click and "Reselect all SmartList Selections" daily)   Diet/type: NPO DVT prophylaxis: prophylactic heparin  GI prophylaxis: PPI Lines: N/A   Foley:  N/A Code Status:  full code Last date of multidisciplinary goals of  care discussion [ with step sister on 2/19]  Labs   CBC: Recent Labs  Lab 02/14/22 0550 02/14/22 1333 02/14/22 1817 02/15/22 0332 02/15/22 1559 02/16/22 0234 02/16/22 0318  WBC 12.5* 10.2 9.3 11.0* 10.8*  --  9.6  NEUTROABS 10.0*  --  7.3 8.9* 8.6*  --  7.5  HGB 8.1* 8.4* 8.2* 8.7* 8.7* 8.8* 8.9*  HCT 23.9* 24.0* 24.6* 26.3* 26.4* 26.0* 27.4*  MCV 84.5 85.1 86.6 87.7 88.6  --  89.8  PLT 50*   51* 51*   55* 53* 66* 77*  --  80*    Basic Metabolic Panel: Recent Labs  Lab 02/21/2022 1135 02/01/2022 1315 01/27/2022 1653 02/21/2022 1703 02/14/22 0550 02/14/22 1120 02/15/22 0332 02/16/22 0234 02/16/22 0318  NA 136   < >  --    < > 142 140 144 146* 144  K 4.6   < >  --    < > 3.1* 3.1* 3.7 3.8 3.8  CL 100  --   --   --  107 108 109  --  113*  CO2 <7*  --   --   --  25 24  25  --  25  GLUCOSE 111*  --   --   --  115* 115* 140*  --  153*  BUN 29*  --   --   --  29* 31* 41*  --  41*  CREATININE 1.86*  --   --   --  1.37* 1.40* 1.72*  --  1.45*  CALCIUM 8.2*  --   --   --  8.1* 8.0* 8.2*  --  8.3*  MG  --   --  1.6*  --   --   --  2.3  --  2.3  PHOS  --   --  4.8*  --   --   --  4.6  --  3.5   < > = values in this interval not displayed.   GFR: Estimated Creatinine Clearance: 67.7 mL/min (A) (by C-G formula based on SCr of 1.45 mg/dL (H)). Recent Labs  Lab 01/29/2022 1304 02/09/2022 1504 02/09/2022 1653 02/14/22 1516 02/14/22 1817 02/15/22 0332 02/15/22 1559 02/16/22 0318  WBC  --   --    < >  --  9.3 11.0* 10.8* 9.6  LATICACIDVEN >9.0* >9.0*  --  1.2  --   --   --   --    < > = values in this interval not displayed.    Liver Function Tests: Recent Labs  Lab 01/26/2022 1135 02/14/22 0550 02/15/22 0332 02/16/22 0318  AST 86* 636* 593* 310*  ALT <5 108* 149* 125*  ALKPHOS 80 73 87 81  BILITOT 4.1* 7.3* 6.4* 6.1*  PROT 4.1* 5.0* 5.5* 5.4*  ALBUMIN 1.6* 2.4* 2.5* 2.4*   No results for input(s): LIPASE, AMYLASE in the last 168 hours. Recent Labs  Lab 01/31/2022 1304  02/16/22 0318  AMMONIA 144* 52*    ABG    Component Value Date/Time   PHART 7.424 02/16/2022 0234   PCO2ART 39.7 02/16/2022 0234   PO2ART 97 02/16/2022 0234   HCO3 26.0 02/16/2022 0234   TCO2 27 02/16/2022 0234   ACIDBASEDEF 8.0 (H) 01/31/2022 1703   O2SAT 98 02/16/2022 0234     Coagulation Profile: Recent Labs  Lab 02/14/22 0115 02/14/22 0550 02/14/22 1333 02/15/22 0332 02/16/22 0318  INR 2.2* 2.0* 2.1* 2.2* 2.0*    Cardiac Enzymes: No results for input(s): CKTOTAL, CKMB, CKMBINDEX, TROPONINI in the last 168 hours.  HbA1C: No results found for: HGBA1C  CBG: Recent Labs  Lab 02/15/22 1932 02/15/22 2322 02/16/22 0322 02/16/22 0324 02/16/22 0724  GLUCAP 144* 148* 47* 156* 150*

## 2022-02-16 NOTE — Evaluation (Signed)
Clinical/Bedside Swallow Evaluation Patient Details  Name: Harold Howard MRN: [id number] Date of Birth: 04/18/1981  Today's Date: 02/16/2022 Time: SLP Start Time (ACUTE ONLY): 1455 SLP Stop Time (ACUTE ONLY): 1515 SLP Time Calculation (min) (ACUTE ONLY): 20 min  Past Medical History:  Past Medical History:  Diagnosis Date   ETOH abuse    Past Surgical History:  Past Surgical History:  Procedure Laterality Date   BACK SURGERY     ESOPHAGEAL BANDING  01/27/2022   Procedure: ESOPHAGEAL BANDING;  Surgeon: Benancio Deeds, MD;  Location: [facility] ENDOSCOPY;  Service: Gastroenterology;;   ESOPHAGOGASTRODUODENOSCOPY N/A 01/26/2022   Procedure: ESOPHAGOGASTRODUODENOSCOPY (EGD);  Surgeon: Benancio Deeds, MD;  Location: [facility] ENDOSCOPY;  Service: Gastroenterology;  Laterality: N/A;   HPI:  Patient is a 41 y.o. male with PMH: daily ETOH use who presented with one month of vomiting blood and dark stools. On day of admission, patient was found on floor sreaming to call 911. He arrived to ED via EMS  and was hypotensive and developed worsening encephalopathy, requiring intubation. He had a seizure in the ED. GI was consulted and performed EGD, revealing large (> 5 mm) esophageal varices with stigmata of recent bleeding, reflux esophagitis, suspected gastric varices without stigmata for bleeding.  Patient self-extubated on 2/22 early AM and was placed on 4L nasal cannula.    Assessment / Plan / Recommendation  Clinical Impression  Patient presents with clinical s/s of dysphagia which appears secondary to current state of lethargy and confusion as well as prolonged (4 day) intubation. Patient was able to follow some commands to reposition in bed and was receptive towards oral care with toothette sponge and toothbrush. His alertness fluctuated quickly; he was able to respond to basic biographical questions but at other times he would stare up at ceiling and not respond at all. He was able to masticate a  small ice chip with mod-max verbal cues to maintain attention. Mild, delayed throat clear observed and patient then c/o cold feeling in esophagus. He declined ice chips after trial of two total. SLP will f/u next date for PO trials. SLP Visit Diagnosis: Dysphagia, unspecified (R13.10)    Aspiration Risk  Moderate aspiration risk;Risk for inadequate nutrition/hydration    Diet Recommendation NPO   Medication Administration: Via alternative means    Other  Recommendations Oral Care Recommendations: Oral care QID;Staff/trained caregiver to provide oral care    Recommendations for follow up therapy are one component of a multi-disciplinary discharge planning process, led by the attending physician.  Recommendations may be updated based on patient status, additional functional criteria and insurance authorization.  Follow up Recommendations Other (comment) (TBD)      Assistance Recommended at Discharge Frequent or constant Supervision/Assistance  Functional Status Assessment Patient has had a recent decline in their functional status and demonstrates the ability to make significant improvements in function in a reasonable and predictable amount of time.  Frequency and Duration min 2x/week  1 week       Prognosis Prognosis for Safe Diet Advancement: Good      Swallow Study   General Date of Onset: 02/12/2022 HPI: Patient is a 41 y.o. male with PMH: daily ETOH use who presented with one month of vomiting blood and dark stools. On day of admission, patient was found on floor sreaming to call 911. He arrived to ED via EMS  and was hypotensive and developed worsening encephalopathy, requiring intubation. He had a seizure in the ED. GI was consulted and performed EGD,  revealing large (> 5 mm) esophageal varices with stigmata of recent bleeding, reflux esophagitis, suspected gastric varices without stigmata for bleeding.  Patient self-extubated on 2/22 early AM and was placed on 4L nasal  cannula. Type of Study: Bedside Swallow Evaluation Previous Swallow Assessment: none found Diet Prior to this Study: NPO Temperature Spikes Noted: No Respiratory Status: Nasal cannula History of Recent Intubation: Yes Length of Intubations (days): 4 days Date extubated: 02/16/22 Behavior/Cognition: Lethargic/Drowsy;Requires cueing;Confused Oral Cavity Assessment: Dry Oral Care Completed by SLP: Yes Oral Cavity - Dentition: Adequate natural dentition;Missing dentition;Poor condition Self-Feeding Abilities: Total assist Patient Positioning: Upright in bed Baseline Vocal Quality: Low vocal intensity Volitional Cough: Cognitively unable to elicit Volitional Swallow: Unable to elicit    Oral/Motor/Sensory Function Overall Oral Motor/Sensory Function: Within functional limits   Ice Chips Ice chips: Impaired Oral Phase Impairments: Poor awareness of bolus;Reduced labial seal;Reduced lingual movement/coordination;Impaired mastication Oral Phase Functional Implications: Prolonged oral transit;Oral holding Pharyngeal Phase Impairments: Suspected delayed Swallow;Throat Clearing - Delayed   Thin Liquid Thin Liquid: Not tested    Nectar Thick Nectar Thick Liquid: Not tested   Honey Thick Honey Thick Liquid: Not tested   Puree Puree: Not tested   Solid     Solid: Not tested      Angela Nevin, MA, CCC-SLP Speech Therapy

## 2022-02-17 LAB — TYPE AND SCREEN
ABO/RH(D): O POS
Antibody Screen: NEGATIVE
Unit division: 0
Unit division: 0
Unit division: 0
Unit division: 0
Unit division: 0
Unit division: 0
Unit division: 0
Unit division: 0
Unit division: 0
Unit division: 0
Unit division: 0
Unit division: 0

## 2022-02-17 LAB — BPAM RBC
Blood Product Expiration Date: 202303142359
Blood Product Expiration Date: 202303222359
Blood Product Expiration Date: 202303222359
Blood Product Expiration Date: 202303222359
Blood Product Expiration Date: 202303232359
Blood Product Expiration Date: 202303232359
Blood Product Expiration Date: 202303232359
Blood Product Expiration Date: 202303232359
Blood Product Expiration Date: 202303232359
Blood Product Expiration Date: 202303232359
Blood Product Expiration Date: 202303232359
Blood Product Expiration Date: 202303232359
ISSUE DATE / TIME: 202302191116
ISSUE DATE / TIME: 202302191116
ISSUE DATE / TIME: 202302191147
ISSUE DATE / TIME: 202302191147
ISSUE DATE / TIME: 202302191147
ISSUE DATE / TIME: 202302191147
ISSUE DATE / TIME: 202302191204
ISSUE DATE / TIME: 202302191204
Unit Type and Rh: 5100
Unit Type and Rh: 5100
Unit Type and Rh: 5100
Unit Type and Rh: 5100
Unit Type and Rh: 5100
Unit Type and Rh: 5100
Unit Type and Rh: 5100
Unit Type and Rh: 5100
Unit Type and Rh: 5100
Unit Type and Rh: 5100
Unit Type and Rh: 5100
Unit Type and Rh: 5100

## 2022-02-17 LAB — CBC WITH DIFFERENTIAL/PLATELET
Abs Immature Granulocytes: 0.07 10*3/uL (ref 0.00–0.07)
Basophils Absolute: 0 10*3/uL (ref 0.0–0.1)
Basophils Relative: 0 %
Eosinophils Absolute: 0 10*3/uL (ref 0.0–0.5)
Eosinophils Relative: 0 %
HCT: 28.4 % — ABNORMAL LOW (ref 39.0–52.0)
Hemoglobin: 9.1 g/dL — ABNORMAL LOW (ref 13.0–17.0)
Immature Granulocytes: 1 %
Lymphocytes Relative: 11 %
Lymphs Abs: 1.2 10*3/uL (ref 0.7–4.0)
MCH: 29.3 pg (ref 26.0–34.0)
MCHC: 32 g/dL (ref 30.0–36.0)
MCV: 91.3 fL (ref 80.0–100.0)
Monocytes Absolute: 1.6 10*3/uL — ABNORMAL HIGH (ref 0.1–1.0)
Monocytes Relative: 15 %
Neutro Abs: 8.4 10*3/uL — ABNORMAL HIGH (ref 1.7–7.7)
Neutrophils Relative %: 73 %
Platelets: 75 10*3/uL — ABNORMAL LOW (ref 150–400)
RBC: 3.11 MIL/uL — ABNORMAL LOW (ref 4.22–5.81)
RDW: 19.1 % — ABNORMAL HIGH (ref 11.5–15.5)
WBC: 11.3 10*3/uL — ABNORMAL HIGH (ref 4.0–10.5)
nRBC: 0.4 % — ABNORMAL HIGH (ref 0.0–0.2)

## 2022-02-17 LAB — COMPREHENSIVE METABOLIC PANEL
ALT: 103 U/L — ABNORMAL HIGH (ref 0–44)
AST: 160 U/L — ABNORMAL HIGH (ref 15–41)
Albumin: 2.4 g/dL — ABNORMAL LOW (ref 3.5–5.0)
Alkaline Phosphatase: 83 U/L (ref 38–126)
Anion gap: 8 (ref 5–15)
BUN: 31 mg/dL — ABNORMAL HIGH (ref 6–20)
CO2: 26 mmol/L (ref 22–32)
Calcium: 8.5 mg/dL — ABNORMAL LOW (ref 8.9–10.3)
Chloride: 115 mmol/L — ABNORMAL HIGH (ref 98–111)
Creatinine, Ser: 1.23 mg/dL (ref 0.61–1.24)
GFR, Estimated: >60 mL/min (ref >60)
Glucose, Bld: 125 mg/dL — ABNORMAL HIGH (ref 70–99)
Potassium: 3.2 mmol/L — ABNORMAL LOW (ref 3.5–5.1)
Sodium: 149 mmol/L — ABNORMAL HIGH (ref 135–145)
Total Bilirubin: 6.9 mg/dL — ABNORMAL HIGH (ref 0.3–1.2)
Total Protein: 5.2 g/dL — ABNORMAL LOW (ref 6.5–8.1)

## 2022-02-17 LAB — PROTIME-INR
INR: 1.7 — ABNORMAL HIGH (ref 0.8–1.2)
Prothrombin Time: 19.7 seconds — ABNORMAL HIGH (ref 11.4–15.2)

## 2022-02-17 LAB — APTT: aPTT: 33 seconds (ref 24–36)

## 2022-02-17 LAB — GLUCOSE, CAPILLARY
Glucose-Capillary: 116 mg/dL — ABNORMAL HIGH (ref 70–99)
Glucose-Capillary: 117 mg/dL — ABNORMAL HIGH (ref 70–99)
Glucose-Capillary: 143 mg/dL — ABNORMAL HIGH (ref 70–99)
Glucose-Capillary: 132 mg/dL — ABNORMAL HIGH (ref 70–99)
Glucose-Capillary: 112 mg/dL — ABNORMAL HIGH (ref 70–99)

## 2022-02-17 LAB — AMMONIA: Ammonia: 44 umol/L — ABNORMAL HIGH (ref 9–35)

## 2022-02-17 MED ORDER — RIFAXIMIN 550 MG PO TABS
550.0000 mg | ORAL_TABLET | Freq: Two times a day (BID) | ORAL | Status: DC
  Administered 2022-02-17 – 2022-02-20 (×8): 550 mg via ORAL
  Filled 2022-02-17 (×8): qty 1

## 2022-02-17 MED ORDER — PANTOPRAZOLE SODIUM 40 MG PO TBEC
40.0000 mg | DELAYED_RELEASE_TABLET | Freq: Two times a day (BID) | ORAL | Status: DC
  Administered 2022-02-17 – 2022-02-20 (×7): 40 mg via ORAL
  Filled 2022-02-17 (×7): qty 1

## 2022-02-17 MED ORDER — LACTULOSE 10 GM/15ML PO SOLN: 20.0000 g | Freq: Two times a day (BID) | ORAL | Status: DC

## 2022-02-17 MED ORDER — CARVEDILOL 3.125 MG PO TABS
3.1250 mg | ORAL_TABLET | Freq: Every day | ORAL | Status: DC
  Administered 2022-02-17 – 2022-02-20 (×3): 3.125 mg via ORAL
  Filled 2022-02-17 (×5): qty 1

## 2022-02-17 MED ORDER — THIAMINE HCL 100 MG PO TABS
100.0000 mg | ORAL_TABLET | Freq: Every day | ORAL | Status: DC
  Administered 2022-02-18 – 2022-02-20 (×3): 100 mg via ORAL
  Filled 2022-02-17 (×3): qty 1

## 2022-02-17 MED ORDER — PHENOBARBITAL 32.4 MG PO TABS
32.4000 mg | ORAL_TABLET | Freq: Three times a day (TID) | ORAL | Status: AC
  Administered 2022-02-17 (×2): 32.4 mg via ORAL
  Filled 2022-02-17 (×3): qty 1

## 2022-02-17 MED ORDER — DEXTROSE 5 % IV SOLN
INTRAVENOUS | Status: DC
  Administered 2022-02-17: 50 mL/h via INTRAVENOUS

## 2022-02-17 MED ORDER — ADULT MULTIVITAMIN W/MINERALS CH
1.0000 | ORAL_TABLET | Freq: Every day | ORAL | Status: DC
  Administered 2022-02-17 – 2022-02-20 (×4): 1 via ORAL
  Filled 2022-02-17 (×4): qty 1

## 2022-02-17 MED ORDER — ENSURE ENLIVE PO LIQD
237.0000 mL | Freq: Three times a day (TID) | ORAL | Status: DC
  Administered 2022-02-17 – 2022-02-20 (×10): 237 mL via ORAL

## 2022-02-17 MED ORDER — POTASSIUM CHLORIDE 10 MEQ/50ML IV SOLN
10.0000 meq | INTRAVENOUS | Status: AC
  Administered 2022-02-17 (×6): 10 meq via INTRAVENOUS
  Filled 2022-02-17 (×6): qty 50

## 2022-02-17 MED ORDER — LACTULOSE 10 GM/15ML PO SOLN
20.0000 g | Freq: Two times a day (BID) | ORAL | Status: DC
Start: 2022-02-17 — End: 2022-02-22
  Administered 2022-02-18 – 2022-02-20 (×6): 20 g via ORAL
  Filled 2022-02-17 (×6): qty 30

## 2022-02-17 MED ORDER — LOPERAMIDE HCL 1 MG/7.5ML PO SUSP
4.0000 mg | ORAL | Status: DC | PRN
  Filled 2022-02-17 (×2): qty 30

## 2022-02-17 MED ORDER — LOPERAMIDE HCL 1 MG/7.5ML PO SUSP
4.0000 mg | ORAL | Status: DC | PRN
  Administered 2022-02-17 – 2022-02-18 (×2): 4 mg via ORAL
  Filled 2022-02-17 (×3): qty 30

## 2022-02-17 MED ORDER — FUROSEMIDE 40 MG PO TABS
40.0000 mg | ORAL_TABLET | Freq: Every day | ORAL | Status: DC
  Administered 2022-02-17 – 2022-02-20 (×4): 40 mg via ORAL
  Filled 2022-02-17 (×4): qty 1

## 2022-02-17 MED ORDER — FOLIC ACID 1 MG PO TABS
1.0000 mg | ORAL_TABLET | Freq: Every day | ORAL | Status: DC
  Administered 2022-02-17 – 2022-02-20 (×4): 1 mg via ORAL
  Filled 2022-02-17 (×4): qty 1

## 2022-02-17 MED ORDER — SPIRONOLACTONE 25 MG PO TABS
50.0000 mg | ORAL_TABLET | Freq: Every day | ORAL | Status: DC
  Administered 2022-02-17 – 2022-02-20 (×4): 50 mg via ORAL
  Filled 2022-02-17 (×5): qty 2

## 2022-02-17 NOTE — Progress Notes (Signed)
PPhysical Therapy Evaluation Patient Details Name: Harold Howard MRN: [id number] DOB: [birth year] Today's Date: 02/17/2022  History of Present Illness  41 y/o gentleman Arrived via EMS after 911 call s/p toilet bowl full of BRB. Presents with 1 month of vomiting blood and dark stools consistent with upper GI bleed  Pt hypotensive and developed worsening encephalopathy in the ED and required intubation. He had a seizure in the ED.  He received 8 pRBCs. s/p EGD on 2/19 with banding of esophageal varices.  2/20 paracentesis. Self extubated 2/21 PMH:  ETOH use half gallon every 2-3 days  Clinical Impression  Pt significant other provides PLOF and home set up due to pt inability to answer questions accurately or appropriately. PTA pt lived with room mate in mobile home with 4 steps to enter. Pt reports full independence working at [employer]. Pt is currently limited in safe mobility by decreased cognition especially decreased initiation, decreased sequencing and decreased command follow in presence of lack of balance and coordination. Pt is currently maxAx2 for bed mobility and stepping transfer from bed to chair. Pt significant other reports that between her and brother's step sister, he will be able to have 24 hour care. That paired with the fact that his prior level of function was independence makes him a good candidate for AIR level rehab. PT will continue to follow acutely.     Recommendations for follow up therapy are one component of a multi-disciplinary discharge planning process, led by the attending physician.  Recommendations may be updated based on patient status, additional functional criteria and insurance authorization.  Follow Up Recommendations Acute inpatient rehab (3hours/day)    Assistance Recommended at Discharge Frequent or constant Supervision/Assistance  Patient can return home with the following  A lot of help with walking and/or transfers;A lot of help with  bathing/dressing/bathroom;Assistance with cooking/housework;Assistance with feeding;Direct supervision/assist for medications management;Direct supervision/assist for financial management;Assist for transportation;Help with stairs or ramp for entrance    Equipment Recommendations Rolling walker (2 wheels);BSC/3in1  Recommendations for Other Services  Rehab consult    Functional Status Assessment Patient has had a recent decline in their functional status and demonstrates the ability to make significant improvements in function in a reasonable and predictable amount of time.     Precautions / Restrictions Precautions Precautions: Fall Precaution Comments: seziure in ED, femoral central line, flexiseal, urinary catheter, Restrictions Weight Bearing Restrictions: No      Mobility  Bed Mobility Overal bed mobility: Needs Assistance Bed Mobility: Supine to Sit     Supine to sit: +2 for physical assistance, +2 for safety/equipment, Max assist     General bed mobility comments: Assist for initiation, able to bring back off of elevated bed, but requires increased assist for navigating LE to EoB and especially for scooting forward to place feet on floor    Transfers Overall transfer level: Needs assistance Equipment used: 2 person hand held assist Transfers: Sit to/from Stand, Bed to chair/wheelchair/BSC Sit to Stand: Mod assist, +2 physical assistance, +2 safety/equipment, From elevated surface   Step pivot transfers: Max assist, +2 safety/equipment       General transfer comment: pt limitations in command follow and sequencing requiring maximal multimodal cuing, good power up but modAx2 for achieving and maintaining balance maximal A x2 for initiation and sequencing pivotal steps to chair, and safe sitting    Ambulation/Gait             Pre-gait activities: marching in place increased effort for lifting  LE off floor                      Balance Overall balance  assessment: Needs assistance Sitting-balance support: Feet supported, No upper extremity supported Sitting balance-Leahy Scale: Fair     Standing balance support: During functional activity, Bilateral upper extremity supported Standing balance-Leahy Scale: Zero Standing balance comment: requires outside assit for balance                             Pertinent Vitals/Pain Pain Assessment Pain Assessment: No/denies pain    Home Living Family/patient expects to be discharged to:: Private residence Living Arrangements: Non-relatives/Friends;Children Available Help at Discharge: Family;Available PRN/intermittently Type of Home: Mobile home Home Access: Stairs to enter Entrance Stairs-Rails: Right;Left Entrance Stairs-Number of Steps: 3 + 1   Home Layout: One level Home Equipment: None      Prior Function Prior Level of Function : Independent/Modified Independent;Working/employed;Driving             Mobility Comments: no use of AD ADLs Comments: Works at [employer]        Extremity/Trunk Assessment   Upper Extremity Assessment Upper Extremity Assessment: Defer to OT evaluation    Lower Extremity Assessment Lower Extremity Assessment: LLE deficits/detail;RLE deficits/detail;Difficult to assess due to impaired cognition RLE Deficits / Details: ROM WFL, strength assessed with movement to be 3+/5 RLE Coordination: decreased fine motor;decreased gross motor LLE Deficits / Details: ROM WFL, strength assessed with movement to be 3+/5 LLE Coordination: decreased fine motor;decreased gross motor       Communication   Communication: Receptive difficulties;Expressive difficulties (slowed processing both directions)  Cognition Arousal/Alertness: Awake/alert Behavior During Therapy: Flat affect, Restless Overall Cognitive Status: Impaired/Different from baseline Area of Impairment: Orientation, Attention, Memory, Following commands, Safety/judgement, Awareness, Problem  solving                 Orientation Level: Disoriented to, Time, Situation Current Attention Level: Focused Memory: Decreased short-term memory Following Commands: Follows one step commands with increased time, Follows one step commands inconsistently, Follows multi-step commands inconsistently, Follows multi-step commands with increased time Safety/Judgement: Decreased awareness of deficits, Decreased awareness of safety Awareness: Intellectual Problem Solving: Slow processing, Decreased initiation, Difficulty sequencing, Requires verbal cues, Requires tactile cues General Comments: Pt with extremly slowed processing, requires maximal multimodal cues for initiation of movement, unable to provide PLOF or home set up, significant other in room, who offers all information        General Comments General comments (skin integrity, edema, etc.): VSS on 2L O2 via [place] SpO2 98%O2        Assessment/Plan    PT Assessment Patient needs continued PT services  PT Problem List Decreased strength;Decreased activity tolerance;Decreased balance;Decreased mobility;Decreased coordination;Decreased cognition;Decreased safety awareness;Decreased knowledge of use of DME       PT Treatment Interventions DME instruction;Gait training;Stair training;Functional mobility training;Therapeutic activities;Therapeutic exercise;Balance training;Cognitive remediation;Patient/family education;Neuromuscular re-education    PT Goals (Current goals can be found in the Care Plan section)  Acute Rehab PT Goals Patient Stated Goal: go home PT Goal Formulation: With patient/family Time For Goal Achievement: 03/03/22 Potential to Achieve Goals: Fair    Frequency Min 3X/week     Co-evaluation PT/OT/SLP Co-Evaluation/Treatment: Yes Reason for Co-Treatment: For patient/therapist safety;Necessary to address cognition/behavior during functional activity PT goals addressed during session: Mobility/safety with  mobility         AM-PAC PT "6 Clicks" Mobility  Outcome Measure  Help needed turning from your back to your side while in a flat bed without using bedrails?: A Little Help needed moving from lying on your back to sitting on the side of a flat bed without using bedrails?: Total Help needed moving to and from a bed to a chair (including a wheelchair)?: Total Help needed standing up from a chair using your arms (e.g., wheelchair or bedside chair)?: Total Help needed to walk in hospital room?: Total Help needed climbing 3-5 steps with a railing? : Total 6 Click Score: 8    End of Session Equipment Utilized During Treatment: Gait belt Activity Tolerance: Patient tolerated treatment well (fatigues quickly once in chair dozes off) Patient left: in chair;with call bell/phone within reach;with chair alarm set;with family/visitor present Nurse Communication: Mobility status;Other (comment) (check chair alarm) PT Visit Diagnosis: Unsteadiness on feet (R26.81);Other abnormalities of gait and mobility (R26.89);Muscle weakness (generalized) (M62.81);Difficulty in walking, not elsewhere classified (R26.2);Other symptoms and signs involving the nervous system (K81.275)    Time: 1700-1749 PT Time Calculation (min) (ACUTE ONLY): 35 min   Charges:   PT Evaluation $PT Eval Moderate Complexity: 1 Mod          Emiyah Spraggins B. Beverely Risen PT, DPT Acute Rehabilitation Services Pager [phone] Office [phone]   Elon Alas Fleet 02/17/2022, 12:03 PM

## 2022-02-17 NOTE — Progress Notes (Signed)
Report called to [employer]. All questions answered. Also called pts girlfriend, Claiborne Billings and made her aware of the transfer. Claiborne Billings stated she would call pts siter Vicky and update her.
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
## 2022-02-17 NOTE — Progress Notes (Addendum)
Daily Rounding Note  02/17/2022, 10:58 AM  LOS: 4 days   SUBJECTIVE:   Chief complaint:    Decompensated cirrhosis, variceal bleed.  Blood loss anemia.  Lethargy improved.  Repeat bedside swallow evaluation this morning with improved swallowing associated with patient being more alert.  He has been cleared for regular diet with thin liquids Stool output 1.2 L yesterday.  Flexi-Seal still in place. Normotensive.  Isolated minor tachycardia to 109.  Oxygen sats in upper 90s to 100% on 4 L nasal cannula oxygen. Patient says he wants to go home denies abdominal pain and nausea. 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  OBJECTIVE:         Vital signs in last 24 hours:    Temp:  [98.1 F (36.7 C)-98.8 F (37.1 C)] 98.3 F (36.8 C) (02/23 0800) Pulse Rate:  [77-109] 85 (02/23 0700) Resp:  [9-22] 11 (02/23 0700) BP: (113-134)/(64-74) 124/70 (02/23 0400) SpO2:  [90 %-100 %] 98 % (02/23 0700) Arterial Line BP: (127-154)/(58-79) 127/60 (02/23 0700) Weight:  [81.1 kg] 81.1 kg (02/23 0345) Last BM Date : 02/17/22 Filed Weights   02/15/22 0415 02/16/22 0500 02/17/22 0345  Weight: 81.8 kg 81 kg 81.1 kg   General: More alert but still displaying psychomotor retardation Heart: RRR. Chest: Clear bilaterally.  No labored breathing. Abdomen: Soft, not tender or distended.  Active bowel sounds. Extremities: No CCE. Neuro/Psych: No asterixis.  Has some difficulty following commands and psychomotor slowing.  Mostly appropriate.  Intake/Output from previous day: 02/22 0701 - 02/23 0700 In: 1784 [I.V.:374; IV Piggyback:210] Out: 2400 [Urine:1150; Stool:1250]  Intake/Output this shift: No intake/output data recorded.  Lab Results: Recent Labs    02/15/22 1559 02/16/22 0234 02/16/22 0318 02/17/22 0313  WBC 10.8*  --  9.6 11.3*  HGB 8.7* 8.8*  8.9* 9.1*  HCT 26.4* 26.0* 27.4* 28.4*  PLT 77*  --  80* 75*   BMET Recent Labs    02/15/22 0332 02/16/22 0234 02/16/22 0318 02/17/22 0313  NA 144 146* 144 149*  K 3.7 3.8 3.8 3.2*  CL 109  --  113* 115*  CO2 25  --  25 26  GLUCOSE 140*  --  153* 125*  BUN 41*  --  41* 31*  CREATININE 1.72*  --  1.45* 1.23  CALCIUM 8.2*  --  8.3* 8.5*   LFT Recent Labs    02/15/22 0332 02/16/22 0318 02/17/22 0313  PROT 5.5* 5.4* 5.2*  ALBUMIN 2.5* 2.4* 2.4*  AST 593* 310* 160*  ALT 149* 125* 103*  ALKPHOS 87 81 83  BILITOT 6.4* 6.1* 6.9*   PT/INR Recent Labs    02/16/22 0318 02/17/22 0313  LABPROT 22.6* 19.7*  INR 2.0* 1.7*   Hepatitis Panel Recent Labs    02/14/22 1632  HEPBSAG NON REACTIVE  HCVAB NON REACTIVE  HEPAIGM NON REACTIVE  HEPBIGM NON REACTIVE    Studies/Results: No results found.  Scheduled Meds:  chlorhexidine gluconate (MEDLINE KIT)  15 mL Mouth Rinse BID   Chlorhexidine Gluconate Cloth  6 each Topical Daily   folic acid  1 mg Oral Daily   heparin injection (subcutaneous)  5,000 Units Subcutaneous Q8H   iron polysaccharides  150 mg Oral Daily   lactulose  300 mL Rectal Q8H   mouth rinse  15 mL Mouth Rinse BID   multivitamin with minerals  1 tablet Oral  Daily   pantoprazole (PROTONIX) IV  40 mg Intravenous Q12H   PHENObarbital  64.8 mg Intravenous Q8H   Followed by   PHENObarbital  32.4 mg Intravenous Q8H   sodium chloride flush  10-40 mL Intracatheter Q12H   thiamine injection  100 mg Intravenous Daily   Continuous Infusions:  sodium chloride Stopped (02/17/22 0956)   cefTRIAXone (ROCEPHIN)  IV Stopped (02/16/22 1829)   dextrose     potassium chloride 50 mL/hr at 02/17/22 1000   PRN Meds:.sodium chloride, albuterol, sodium chloride flush   ASSESMENT:   Hematemesis w hemorrhagic shock..  02/13/2021 EGD with large esophageal varices with stigmata of bleeding.  11 bands placed.  Reflux esophagitis.  Portal hypertensive gastropathy.  Suspicion  for gastric varices without stigmata of bleeding.  Lactic acidosis resolved.  Completed 72 h of Sandostatin and PPI drip.  Now on Protonix 40 mg IV bid.        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  Suspect ETOH hepatitis and element of shock liver.  LFTs steadly better.  Ultrasound confirms cirrhosis, no focal lesion.  Bidirectional flow at main portal vein and varicosities at the hilum.  Negative work-up for metabolic and autoimmune causes of liver disease thus far.  ANA still in process.  AFP not elevated.      Blood loss anemia.  Low ferritin.  Treated at admission with massive transfusion protocol including PRBCs (at least 8 units), FFP, platelets.  Hgb 1.9 .... 8.9 ... 9.1.    Dr Tacy Learn added po iron as of today (caveat still NPO).     Coagulopathy.  INR 2 .. 1.7.  IV Vit K 2/22..       Seizure in ED, negative CT.  May be related to ETOH withdrawal as pt "cut back" on ETOH in days leading up to admission.  IV phenobarb in place.       Ascites, seen on ultrasound.  2/20 bedside paracentesis, 40 cc's, negative for SBP.  On Rocephin for SBP prophylaxis.  Day 5.        Thrombocytopenia.  Platelets 75 K.      AKI, improving.  continued nrmal GFR     Encephalopathy.   Multifactorial including ETOH witdrawal, HE.  Lactulose enemas in place.     PLAN   Switch to Protonix 40 po bid.   switch to oral lactulose.  Stop Rocephin after today's dose.  For now leave Flexi-Seal in place but anticipate this could be removed within the next 24 hours.  Obviously, despite patient's wishes, he is not ready for discharge could go to stepdown however.  Question adding nonselective beta-blocker.  Will need repeat EGD within 6 weeks to reassess varices and perform additional banding as indicated.   Total ETOH abstinence.      Azucena Freed  02/17/2022, 10:58 AM Phone [phone]     Attending physician's note   I have taken history, reviewed the chart and examined the patient. I performed a substantive portion  of this encounter, including complete performance of at least one of the key components, in conjunction with the APP. I agree with the Advanced Practitioner's note, impression and recommendations.   Variceal bleed s/p EVL x 11 (2/18) after massive transfusion protocol. No further bleeding. Hb stable.   EtOH liver cirrhosis with portal hypertension.  Neg acute hep panel, AMA, ASMA. Nl AFP. Immune to hep A/B. Neg metabolic WU so far and autoimmune WU. Assoc jaundice, thrombocytopenia and coagulopathy.   Ascites. No SBP  Hepatic encephalopathy  Plan: -PO lactulose 30 cc po BID (can titrate to 2-3 Bms/day) -Add rifaximin 550 mg p.o. BID -Add Coreg 3.125 mg po QD. Maximize dose. Keep SBP>90 -Avoid nonsteroidals -Low-salt, normal protein diet -Can start lasix 40/Aldactone 50.  Monitor electrolytes and renal function. -FU GI in 4-6 weeks -Recommend repeating EGD in 6-8 weeks with further EVL if needed -Strict alcohol abstinence. -D/W pt's significant other. - Will sign off. Pl call if any ?   Carmell Austria, MD [facility] [phone]

## 2022-02-17 NOTE — Progress Notes (Signed)
? ?  Inpatient Rehab Admissions Coordinator : ? ?Per therapy recommendations, patient was screened for CIR candidacy by Zacariah Belue RN MSN.  At this time patient appears to be a potential candidate for CIR. I will place a rehab consult per protocol for full assessment. Please call me with any questions. ? ?Latricia Cerrito RN MSN ?Admissions Coordinator ?[phone] ?  ?

## 2022-02-17 NOTE — Progress Notes (Signed)
Patient recent arrival to unit. Patient alert and oriented x3. Patient does have difficulty staying awake. Present with fatigue, denies any pain. O2 88-90% on arrival. O2 at 2L placed. Skin assessment performed with Clinical research associate and MO LPN. Minimal bruising noted on Lower abd and Bilateral extremities. All other skin WDL. Patient has sacral foam in place, CHG given on arrival. Tele set u0p on arrival. See flow sheet for all other documentation. Patient oriented to unit and educated on use of call bell. Safety measures implemented. Will continue to monitor at this time.

## 2022-02-17 NOTE — Progress Notes (Addendum)
NAME:  Harold Howard, MRN:  [id number], DOB:  1980/12/29, LOS: 4 ADMISSION DATE:  02/17/2022, CONSULTATION DATE:  01/27/2022 REFERRING MD:  Gustavus Messing, CHIEF COMPLAINT:  hemorrhagic shock   History of Present Illness:  Harold Howard is a 41 y/o gentleman with a history of daily ETOH use who presents with 1 month of vomiting blood and dark stools consistent with upper GI bleed s/p EGD on 2/19 with banding of esophageal varices.   History is provided by the ED and his step sister as he is intubated and sedated. His family is estranged due to his ETOH consumption. His step-sister indicated she is the only family he is in contact with. He drinks about a fifth per day, but has been working on cutting back recently, but he still drinks about a half gallon of liquor every 2-3 days. He came home from work early yesterday after texting his roommate a picture of a toilet bowl full of BRB. He was found on the floor this morning screaming to call 911. No emesis with EMS. Received 500cc crystalloid and zofran. He was hypotensive and developed worsening encephalopathy in the ED and required intubation. He had a seizure in the ED.  He received 8 pRBCs. PCCM consulted for admission. GI consulted by the ED.  No known use of NSAIDs or anticoagulants. His step-sister is not clear on his medical history.  Pertinent  Medical History  ETOH abuse  Significant Hospital Events: Including procedures, antibiotic start and stop dates in addition to other pertinent events   2/19 admission, intubated sedated, EGD  2/20 paracentesis  2/22 patient self-extubated, off pressors   Procedures:  2/19 EGD: EGD yesterday with large esophageal varices, suspected gastric varices, and portal hypertensive gastropathy   Antibiotics:  2/19 Ceftriaxone for SBP prophylaxis   Interim History / Subjective:  No acute events overnight  Speech and language pathologist saw yesterday recommended NPO and will follow up with PO trials    Objective   Blood pressure 124/70, pulse 85, temperature 98.8 F (37.1 C), temperature source Bladder, resp. rate 11, height 5\' 9"  (1.753 m), weight 81.1 kg, SpO2 98 %.        Intake/Output Summary (Last 24 hours) at 02/17/2022 0707 Last data filed at 02/17/2022 0700 Gross per 24 hour  Intake 1783.97 ml  Output 2400 ml  Net -616.03 ml   Filed Weights   02/15/22 0415 02/16/22 0500 02/17/22 0345  Weight: 81.8 kg 81 kg 81.1 kg   Vitals last 24 hours:  T 97.2-98.8 PR 77-109 RR 9-22 SBP 113-134 DBP 64-74 O2 90-100% 4L [place]   Examination: General: critically ill appearing man, more alert and oriented x 3  HENT: [place]/AT, eyes anicteric Lungs: CTAB, no accessory muscle use  Cardiovascular: S1S2, tachycardic Abdomen: descended, NT Extremities: no peripheral edema, no cyanosis or clubbing Neuro: RASS -1, PERRL, drowsy able to follow a few commands  Derm: no telangiectasias or spider angioma, no bruising  Labs Hypernatremic Na 149  Hypokalemic K 3.2    BUN improving to 31 Cr normalized to 1.23  T bili 6.4  WBC increased to 11.3 H/H 9.1/28.4  Liver enzyme elevation - improving  AST  160 ALT 103 Ammonia 44   Resolved Hospital Problem list   Hypocalcemia  Severe metabolic acidosis  Assessment & Plan:  Hemorrhagic shock, resolved Acute on subacute GIB, resolved   Esophageal Varices s/p banding  H&H remained stable. Alert and oriented x 3.  - Transfer to progressive unit  - Continue pantoprazole  IV BID switch to PO as tolerated  - Continue Ceftriaxone for SBP prophylaxis for 5 days initiated on 2/19  - Recommend ETOH cessation; can counsel when appropriate - Speech evaluation  - Iron supplementation   - Monitor electrolytes and supplement   Decompensated cirrhosis  Ammonia downtrending to 44. Hyperbilirubinemia and elevated transaminase due to hypoperfusion, ETOH abuse. 2/19 RUQ [place]: cirrhotic hepatic morphology, abdominopelvic ascites, no gallstones or biliary  dilation, bidirectional flow is suspected in the main portal vein with varicosities at the hepatic hilum. MELD score 24. Paracentesis on 2/20 not consistent with SBP. SAAG is 1 g/dl. Liver work up negative thus far and etiology of cirrhosis is likely secondary to alcohol use. Peritoneal fluid culture showed no growth.  - Maintain adequate perfusion - Continue lactulose  - Outpatient GI follow up  - ETOH cessation   - ANA pending    Acute respiratory failure with hypoxia - Continue [place] with SpO2 > 88% , wean as tolerated   Hypernatremia  Na 149  - D5 fluid   Hypokalemia K of 3.2 - Replete with goal K > 4   Thrombocytopenia  Iron deficiency  Ferritin came back as 22.  - Iron supplementation   ETOH abuse - recommend cessation - vitamins when able to take PO - monitor for withdrawal - Phenobarbital taper ending on 2/25   AKI, resolved - maintain adequate perfusion - strict I/Os - renally dose meds, avoid nephrotoxic meds  Prolonged Qtc  5/19 Qtc 511. Not currently on QT prolonging medications.  - Avoid high risk QT prolonging medications - Continue to monitor   Seizure, likely due to profound hypoperfusion and hypoxia Head CT on 2/19 showed no acute intracranial pathology.  - if recurrent, add keppra - ativan PRN - seizure precautions   Best Practice (right click and "Reselect all SmartList Selections" daily)   Diet/type: NPO DVT prophylaxis: prophylactic heparin  GI prophylaxis: PPI Lines: N/A   Foley:  N/A Code Status:  full code Last date of multidisciplinary goals of care discussion [ with step sister on 2/19]  Labs   CBC: Recent Labs  Lab 02/14/22 1817 02/15/22 0332 02/15/22 1559 02/16/22 0234 02/16/22 0318 02/17/22 0313  WBC 9.3 11.0* 10.8*  --  9.6 11.3*  NEUTROABS 7.3 8.9* 8.6*  --  7.5 8.4*  HGB 8.2* 8.7* 8.7* 8.8* 8.9* 9.1*  HCT 24.6* 26.3* 26.4* 26.0* 27.4* 28.4*  MCV 86.6 87.7 88.6  --  89.8 91.3  PLT 53* 66* 77*  --  80* 75*    Basic  Metabolic Panel: Recent Labs  Lab 02/17/2022 1653 02/07/2022 1703 02/14/22 0550 02/14/22 1120 02/15/22 0332 02/16/22 0234 02/16/22 0318 02/17/22 0313  NA  --    < > 142 140 144 146* 144 149*  K  --    < > 3.1* 3.1* 3.7 3.8 3.8 3.2*  CL  --   --  107 108 109  --  113* 115*  CO2  --   --  25 24 25   --  25 26  GLUCOSE  --   --  115* 115* 140*  --  153* 125*  BUN  --   --  29* 31* 41*  --  41* 31*  CREATININE  --   --  1.37* 1.40* 1.72*  --  1.45* 1.23  CALCIUM  --   --  8.1* 8.0* 8.2*  --  8.3* 8.5*  MG 1.6*  --   --   --  2.3  --  2.3  --   PHOS 4.8*  --   --   --  4.6  --  3.5  --    < > = values in this interval not displayed.   GFR: Estimated Creatinine Clearance: 79.8 mL/min (by C-G formula based on SCr of 1.23 mg/dL). Recent Labs  Lab 02/03/2022 1304 02/02/2022 1504 02/15/2022 1653 02/14/22 1516 02/14/22 1817 02/15/22 0332 02/15/22 1559 02/16/22 0318 02/17/22 0313  WBC  --   --    < >  --    < > 11.0* 10.8* 9.6 11.3*  LATICACIDVEN >9.0* >9.0*  --  1.2  --   --   --   --   --    < > = values in this interval not displayed.    Liver Function Tests: Recent Labs  Lab 02/02/2022 1135 02/14/22 0550 02/15/22 0332 02/16/22 0318 02/17/22 0313  AST 86* 636* 593* 310* 160*  ALT <5 108* 149* 125* 103*  ALKPHOS 80 73 87 81 83  BILITOT 4.1* 7.3* 6.4* 6.1* 6.9*  PROT 4.1* 5.0* 5.5* 5.4* 5.2*  ALBUMIN 1.6* 2.4* 2.5* 2.4* 2.4*   No results for input(s): LIPASE, AMYLASE in the last 168 hours. Recent Labs  Lab 01/31/2022 1304 02/16/22 0318 02/17/22 0313  AMMONIA 144* 52* 44*    ABG    Component Value Date/Time   PHART 7.424 02/16/2022 0234   PCO2ART 39.7 02/16/2022 0234   PO2ART 97 02/16/2022 0234   HCO3 26.0 02/16/2022 0234   TCO2 27 02/16/2022 0234   ACIDBASEDEF 8.0 (H) 02/11/2022 1703   O2SAT 98 02/16/2022 0234     Coagulation Profile: Recent Labs  Lab 02/14/22 0550 02/14/22 1333 02/15/22 0332 02/16/22 0318 02/17/22 0313  INR 2.0* 2.1* 2.2* 2.0* 1.7*     Cardiac Enzymes: No results for input(s): CKTOTAL, CKMB, CKMBINDEX, TROPONINI in the last 168 hours.  HbA1C: No results found for: HGBA1C  CBG: Recent Labs  Lab 02/16/22 1133 02/16/22 1503 02/16/22 1942 02/16/22 2329 02/17/22 0336  GLUCAP 137* 132* 117* 128* 116*

## 2022-02-17 NOTE — Progress Notes (Signed)
Nutrition Follow-up  DOCUMENTATION CODES:   Non-severe (moderate) malnutrition in context of social or environmental circumstances  INTERVENTION:   - Ensure Enlive po TID, each supplement provides 350 kcal and 20 grams of protein  - Continue MVI with minerals daily  NUTRITION DIAGNOSIS:   Moderate Malnutrition related to social / environmental circumstances (EtOH abuse) as evidenced by mild fat depletion, moderate muscle depletion.  Ongoing, being addressed via diet advancement and oral nutrition supplements  GOAL:   Patient will meet greater than or equal to 90% of their needs  Progressing  MONITOR:   PO intake, Supplement acceptance, Labs, Weight trends, I & O's  REASON FOR ASSESSMENT:   Ventilator    ASSESSMENT:   41 year old male who presented to the ED on 2/19 with hematemesis and dark stools x 1 month. Pt had a seizure in the ED and required intubation for airway protection. PMH of EtOH abuse. Pt admitted with hemorrhagic shock, GI bleed, AKI.  02/19 - s/p EGD showing large esophageal varices with stigmata of recent bleeding s/p banding x 11, reflux esophagitis, portal hypertensive gastropathy 02/20 - s/p paracentesis  02/22 - self-extubated 02/23 - diet advanced to regular with thin liquids  Discussed pt with RN and during ICU rounds. Pt's diet was advanced to regular with thin liquids. Per RN and pt's significant other, pt taking in a lot of water and doing well with it. Pt amenable to trying strawberry Ensure supplements. RD provided one at time of visit which pt tried and enjoyed. Pt planning to finish the supplement. Pt's significant other to bring in outside food items per pt's request. RD to order Ensure Enlive TID between meals.  Admit weight: 76 kg Current weight: 81.1 kg  Pt with mild pitting edema to BLE.  Medications reviewed and include: folic acid, niferex, lactulose, MVI with minerals, protonix, phenobarbital taper, thiamine, IV abx, IV KCl 10 mEq  x 6 runs IVF: D5 @ 50 ml/hr  Labs reviewed: sodium 149, potassium 3.2, BUN 31, elevated LFTs, ammonia 44, hemoglobin 9.1, WBC 11.3 CBG's: 116-137 x 24 hours  UOP: 1150 ml x 24 hours Stool: 1250 ml x 24 hours via rectal tube I/O's: +2.4 L since admit  Diet Order:   Diet Order             Diet regular Room service appropriate? Yes; Fluid consistency: Thin  Diet effective now                   EDUCATION NEEDS:   Education needs have been addressed  Skin:  Skin Assessment: Reviewed RN Assessment  Last BM:  02/17/22  Height:   Ht Readings from Last 1 Encounters:  02/03/2022 5\' 9"  (1.753 m)    Weight:   Wt Readings from Last 1 Encounters:  02/17/22 81.1 kg    BMI:  Body mass index is 26.4 kg/m.  Estimated Nutritional Needs:   Kcal:  2100-2300  Protein:  105-120 grams  Fluid:  2.0 L/day    Harold Bryant, MS, RD, LDN Inpatient Clinical Dietitian Please see AMiON for contact information.

## 2022-02-17 NOTE — Progress Notes (Signed)
Speech Language Pathology Treatment: Dysphagia  Patient Details Name: Harold Howard MRN: [id number] DOB: [birth year] Today's Date: 02/17/2022 Time: 0900-0920 SLP Time Calculation (min) (ACUTE ONLY): 20 min  Assessment / Plan / Recommendation Clinical Impression  The patient was seen for PO trials and dysphagia treatment. Family members present throughout the entirety of the session. Family reports patient has been requesting food/drink. Patient is more alert and attentive than previously targeted session (2/22). Family members verbalized that the patient typically wears contacts, which is impacting the patient's ability to self-feed. Patient required max assist to participate in hand-over-hand self-feeding behaviors. Patient tolerated ice chips, puree, and solid graham cracker without overt s/sx of aspiration. Consecutive sips of thin water via straw cup resulted in delayed cough. SLP incorporated 3 oz consecutive sips of water test and patient did not display any further overt s/sx of aspiration. Family members report the patient has a hx of smoking/coughing at baseline. SLP recommends placing the patient on regular diet with thin liquids. Family members educated on facilitating small sips/bites to patient and verbally cueing to chew/swallow in order to clear oral cavity and reduce oral holding behaviors. SLP to sign off.    HPI HPI: Patient is a 41 y.o. male with PMH: daily ETOH use who presented with one month of vomiting blood and dark stools. On day of admission, patient was found on floor sreaming to call 911. He arrived to ED via EMS  and was hypotensive and developed worsening encephalopathy, requiring intubation. He had a seizure in the ED. GI was consulted and performed EGD, revealing large (> 5 mm) esophageal varices with stigmata of recent bleeding, reflux esophagitis, suspected gastric varices without stigmata for bleeding.  Patient self-extubated on 2/22 early AM and was placed on 4L nasal  cannula.      SLP Plan  All goals met      Recommendations for follow up therapy are one component of a multi-disciplinary discharge planning process, led by the attending physician.  Recommendations may be updated based on patient status, additional functional criteria and insurance authorization.    Recommendations  Diet recommendations: Regular;Thin liquid Liquids provided via: Cup;Straw Medication Administration: Whole meds with liquid Supervision: Staff to assist with self feeding Compensations: Minimize environmental distractions;Slow rate;Small sips/bites Postural Changes and/or Swallow Maneuvers: Seated upright 90 degrees                Oral Care Recommendations: Oral care QID;Staff/trained caregiver to provide oral care Follow Up Recommendations: No SLP follow up Assistance recommended at discharge: PRN SLP Visit Diagnosis: Dysphagia, unspecified (R13.10) Plan: All goals met           Vaughan Sine  02/17/2022, 10:29 AM

## 2022-02-17 NOTE — Progress Notes (Signed)
[facility] ADULT ICU REPLACEMENT PROTOCOL   The patient does apply for the [facility] Adult ICU Electrolyte Replacment Protocol based on the criteria listed below:   1.Exclusion criteria: TCTS patients, ECMO patients, and Dialysis patients 2. Is GFR >/= 30 ml/min? Yes.    Patient's GFR today is >60 3. Is SCr </= 2? Yes.   Patient's SCr is 1.23 mg/dL 4. Did SCr increase >/= 0.5 in 24 hours? No. 5.Pt's weight >40kg  Yes.   6. Abnormal electrolyte(s):   K 3.2  7. Electrolytes replaced per protocol 8.  Call MD STAT for K+ </= 2.5, Phos </= 1, or Mag </= 1 Physician:  S. Bobbye Morton R Kdyn Vonbehren 02/17/2022 5:28 AM

## 2022-02-18 LAB — BASIC METABOLIC PANEL
Anion gap: 7 (ref 5–15)
BUN: 17 mg/dL (ref 6–20)
CO2: 24 mmol/L (ref 22–32)
Calcium: 8.2 mg/dL — ABNORMAL LOW (ref 8.9–10.3)
Chloride: 110 mmol/L (ref 98–111)
Creatinine, Ser: 1.03 mg/dL (ref 0.61–1.24)
GFR, Estimated: >60 mL/min (ref >60)
Glucose, Bld: 124 mg/dL — ABNORMAL HIGH (ref 70–99)
Potassium: 3.1 mmol/L — ABNORMAL LOW (ref 3.5–5.1)
Sodium: 141 mmol/L (ref 135–145)

## 2022-02-18 LAB — CBC WITH DIFFERENTIAL/PLATELET
Abs Immature Granulocytes: 0.06 10*3/uL (ref 0.00–0.07)
Basophils Absolute: 0 10*3/uL (ref 0.0–0.1)
Basophils Relative: 0 %
Eosinophils Absolute: 0.4 10*3/uL (ref 0.0–0.5)
Eosinophils Relative: 4 %
HCT: 29.5 % — ABNORMAL LOW (ref 39.0–52.0)
Hemoglobin: 9.3 g/dL — ABNORMAL LOW (ref 13.0–17.0)
Immature Granulocytes: 1 %
Lymphocytes Relative: 18 %
Lymphs Abs: 1.6 10*3/uL (ref 0.7–4.0)
MCH: 29 pg (ref 26.0–34.0)
MCHC: 31.5 g/dL (ref 30.0–36.0)
MCV: 91.9 fL (ref 80.0–100.0)
Monocytes Absolute: 1.2 10*3/uL — ABNORMAL HIGH (ref 0.1–1.0)
Monocytes Relative: 14 %
Neutro Abs: 5.6 10*3/uL (ref 1.7–7.7)
Neutrophils Relative %: 63 %
Platelets: 64 10*3/uL — ABNORMAL LOW (ref 150–400)
RBC: 3.21 MIL/uL — ABNORMAL LOW (ref 4.22–5.81)
RDW: 18.4 % — ABNORMAL HIGH (ref 11.5–15.5)
WBC: 8.8 10*3/uL (ref 4.0–10.5)
nRBC: 0.2 % (ref 0.0–0.2)

## 2022-02-18 LAB — BODY FLUID CULTURE W GRAM STAIN: Culture: NO GROWTH

## 2022-02-18 LAB — PROTIME-INR
INR: 1.7 — ABNORMAL HIGH (ref 0.8–1.2)
Prothrombin Time: 19.7 seconds — ABNORMAL HIGH (ref 11.4–15.2)

## 2022-02-18 LAB — GLUCOSE, CAPILLARY
Glucose-Capillary: 104 mg/dL — ABNORMAL HIGH (ref 70–99)
Glucose-Capillary: 98 mg/dL (ref 70–99)

## 2022-02-18 LAB — APTT: aPTT: 35 seconds (ref 24–36)

## 2022-02-18 LAB — ANTINUCLEAR ANTIBODIES, IFA: ANA Ab, IFA: NEGATIVE

## 2022-02-18 MED ORDER — LIP MEDEX EX OINT
TOPICAL_OINTMENT | CUTANEOUS | Status: DC | PRN
  Administered 2022-02-19: 75 via TOPICAL
  Filled 2022-02-18: qty 7

## 2022-02-18 MED ORDER — NICOTINE 21 MG/24HR TD PT24
21.0000 mg | MEDICATED_PATCH | Freq: Every day | TRANSDERMAL | Status: DC
  Administered 2022-02-18 – 2022-02-26 (×7): 21 mg via TRANSDERMAL
  Filled 2022-02-18 (×7): qty 1

## 2022-02-18 MED ORDER — CHLORHEXIDINE GLUCONATE 0.12 % MT SOLN
15.0000 mL | Freq: Two times a day (BID) | OROMUCOSAL | Status: DC
  Administered 2022-02-19 – 2022-02-21 (×4): 15 mL via OROMUCOSAL
  Filled 2022-02-18 (×4): qty 15

## 2022-02-18 NOTE — Progress Notes (Signed)
Inpatient Rehab Admissions Coordinator:   Spoke to pt, significant other, and sister over the phone to discuss CIR recommendations and goals/expectations of CIR stay.  All are in agreement to pursue, average length of stay 2 weeks, goals of supervision.  Plan to have someone home with him 24/7, and they are working on the schedule now.  Pt's significant other states he does have insurance through [facility], so we will try to locate this policy.  We will need prior auth to proceed with CIR admit as well.   Estill Dooms, PT, DPT Admissions Coordinator [phone] 02/18/22  1:30 PM

## 2022-02-18 NOTE — Progress Notes (Signed)
PROGRESS NOTE    Harold Howard  OYD:[id number] DOB: 01-Jun-1981 DOA: 02/12/2022 PCP: Patient, No Pcp Per (Inactive)     Chief Complaint  Patient presents with   Near Syncope   Emesis   Diarrhea    Brief Narrative:    Mr. Harold Howard is a 41 y/o gentleman with a history of daily ETOH use who presents with 1 month of vomiting blood and dark stools consistent with upper GI bleed s/p EGD on 2/19 with banding of esophageal varices.    History is provided by the ED and his step sister as he is intubated and sedated. His family is estranged due to his ETOH consumption. His step-sister indicated she is the only family he is in contact with. He drinks about a fifth per day, but has been working on cutting back recently, but he still drinks about a half gallon of liquor every 2-3 days. He came home from work early yesterday after texting his roommate a picture of a toilet bowl full of BRB. He was found on the floor this morning screaming to call 911. No emesis with EMS. Received 500cc crystalloid and zofran. He was hypotensive and developed worsening encephalopathy in the ED and required intubation. He had a seizure in the ED.  He received 8 pRBCs. PCCM consulted for admission. GI consulted by the ED.   No known use of NSAIDs or anticoagulants. His step-sister is not clear on his medical history. 2/19 admission, intubated sedated, EGD  2/20 paracentesis  2/22 patient self-extubated, off pressors     Assessment & Plan:   Principal Problem:   Upper GI bleed Active Problems:   ETOH abuse   Hypovolemic shock (HCC)   Bleeding esophageal varices (HCC)   Malnutrition of moderate degree   Hemorrhagic shock Acute on subacute upper GIB, Esophageal Varices  Acute blood loss anemia  -Hemorrhagic  shock resolved, off pressors, but blood pressure remains soft - Continue pantoprazole IV BID switch to PO as tolerated  - Continue Ceftriaxone for SBP prophylaxis for 5 days initiated on 2/19  -  Recommend ETOH cessation; can counsel when appropriate -Tolerating regular diet - Iron supplementation   - Monitor electrolytes and supplement  -Monitor CBC closely -Follow-up with GI in 4 to 6 weeks with upper endoscopy in 6 to 8 weeks with further EVL if needed   Decompensated alcoholic cirrhosis  Ammonia downtrending to 44. Hyperbilirubinemia and elevated transaminase due to hypoperfusion,  ETOH abuse.  - 2/19 RUQ [place]: cirrhotic hepatic morphology, abdominopelvic ascites, no gallstones or biliary dilation, bidirectional flow is suspected in the main portal vein with varicosities at the hepatic hilum. MELD score 24. Paracentesis on 2/20 not consistent with SBP. SAAG is 1 g/dl. Liver work up negative thus far and etiology of cirrhosis is likely secondary to alcohol use. Peritoneal fluid culture showed no growth.  - Continue lactulose 30 cc twice daily, can be titrated for 2-3 BMs per day - Outpatient GI follow up  - ETOH cessation   - ANA pending    -Low-salt diet, normal protein diet -Started on rifaximin -  on Lasix and Aldactone. -Avoid NSAIDs -Started on Coreg  Acute respiratory failure with hypoxia - Continue [facility] [place] with SpO2 > 88% , wean as tolerated    Hypernatremia  -Resolved, DC D5 W   Hypokalemia K of 3.2 - Replete with goal K > 4    Thrombocytopenia  Iron deficiency  Ferritin came back as 22.  - Iron supplementation    ETOH abuse -  recommend cessation - vitamins when able to take PO - monitor for withdrawal - Phenobarbital taper ending on 2/25    AKI, resolved - maintain adequate perfusion - strict I/Os - renally dose meds, avoid nephrotoxic meds   Prolonged Qtc  5/19 Qtc 511. Not currently on QT prolonging medications.  - Avoid high risk QT prolonging medications - Continue to monitor    Seizure, likely due to profound hypoperfusion and hypoxia Head CT on 2/19 showed no acute intracranial pathology.  - if recurrent, add keppra - ativan PRN - seizure  precautions   DVT prophylaxis: SCD  Code Status: Full Family Communication: None at bedside Disposition:   Status is: Inpatient Remains inpatient appropriate because: Acutely ill with liver failure.    Procedures:  - 2/19 EGD: EGD yesterday with large esophageal varices, suspected gastric varices, and portal hypertensive gastropathy    Antibiotics:  - 2/19 Ceftriaxone for SBP prophylaxis          Consultants:  PCCM GI    Subjective:  No significant events overnight as discussed with staff.  Mains with loose bowel movement on Flexi-Seal.  Objective: Vitals:   02/18/22 0819 02/18/22 1225 02/18/22 1255 02/18/22 1407  BP: (!) 104/47 93/64 (!) 93/59 93/62  Pulse: 80 99 99 100  Resp: 20 (!) 21 16 19   Temp: 98.4 F (36.9 C) 98.2 F (36.8 C) 98.3 F (36.8 C)   TempSrc: Oral Oral Oral   SpO2: 95% 98% (!) 85% 98%  Weight:      Height:        Intake/Output Summary (Last 24 hours) at 02/18/2022 1502 Last data filed at 02/18/2022 1256 Gross per 24 hour  Intake 2102 ml  Output 1280 ml  Net 822 ml   Filed Weights   02/17/22 0345 02/17/22 2047 02/18/22 0500  Weight: 81.1 kg 91.2 kg 91.2 kg    Examination:  Awake Alert, Oriented X 3, critically ill-appearing male Symmetrical Chest wall movement, Good air movement bilaterally, CTAB RRR,No Gallops,Rubs or new Murmurs, No Parasternal Heave +ve B.Sounds, Abd distended with ascites rebuilding, No tenderness, No rebound - guarding or rigidity. No Cyanosis, Clubbing or edema, No new Rash or bruise      Data Reviewed: I have personally reviewed following labs and imaging studies  CBC: Recent Labs  Lab 02/15/22 0332 02/15/22 1559 02/16/22 0234 02/16/22 0318 02/17/22 0313 02/18/22 0703  WBC 11.0* 10.8*  --  9.6 11.3* 8.8  NEUTROABS 8.9* 8.6*  --  7.5 8.4* 5.6  HGB 8.7* 8.7* 8.8* 8.9* 9.1* 9.3*  HCT 26.3* 26.4* 26.0* 27.4* 28.4* 29.5*  MCV 87.7 88.6  --  89.8 91.3 91.9  PLT 66* 77*  --  80* 75* 64*     Basic Metabolic Panel: Recent Labs  Lab 01/30/2022 1653 02/09/2022 1703 02/14/22 1120 02/15/22 0332 02/16/22 0234 02/16/22 0318 02/17/22 0313 02/18/22 0703  NA  --    < > 140 144 146* 144 149* 141  K  --    < > 3.1* 3.7 3.8 3.8 3.2* 3.1*  CL  --    < > 108 109  --  113* 115* 110  CO2  --    < > 24 25  --  25 26 24   GLUCOSE  --    < > 115* 140*  --  153* 125* 124*  BUN  --    < > 31* 41*  --  41* 31* 17  CREATININE  --    < > 1.40* 1.72*  --  1.45* 1.23 1.03  CALCIUM  --    < > 8.0* 8.2*  --  8.3* 8.5* 8.2*  MG 1.6*  --   --  2.3  --  2.3  --   --   PHOS 4.8*  --   --  4.6  --  3.5  --   --    < > = values in this interval not displayed.    GFR: Estimated Creatinine Clearance: 106.4 mL/min (by C-G formula based on SCr of 1.03 mg/dL).  Liver Function Tests: Recent Labs  Lab 02/16/2022 1135 02/14/22 0550 02/15/22 0332 02/16/22 0318 02/17/22 0313  AST 86* 636* 593* 310* 160*  ALT <5 108* 149* 125* 103*  ALKPHOS 80 73 87 81 83  BILITOT 4.1* 7.3* 6.4* 6.1* 6.9*  PROT 4.1* 5.0* 5.5* 5.4* 5.2*  ALBUMIN 1.6* 2.4* 2.5* 2.4* 2.4*    CBG: Recent Labs  Lab 02/17/22 1203 02/17/22 1626 02/17/22 1938 02/18/22 0035 02/18/22 0449  GLUCAP 143* 132* 112* 98 104*     Recent Results (from the past 240 hour(s))  Resp Panel by RT-PCR (Flu A&B, Covid) Nasopharyngeal Swab     Status: None   Collection Time: 02/17/2022 11:36 AM   Specimen: Nasopharyngeal Swab; Nasopharyngeal(NP) swabs in vial transport medium  Result Value Ref Range Status   SARS Coronavirus 2 by RT PCR NEGATIVE NEGATIVE Final    Comment: (NOTE) SARS-CoV-2 target nucleic acids are NOT DETECTED.  The SARS-CoV-2 RNA is generally detectable in upper respiratory specimens during the acute phase of infection. The lowest concentration of SARS-CoV-2 viral copies this assay can detect is 138 copies/mL. A negative result does not preclude SARS-Cov-2 infection and should not be used as the sole basis for treatment  or other patient management decisions. A negative result may occur with  improper specimen collection/handling, submission of specimen other than nasopharyngeal swab, presence of viral mutation(s) within the areas targeted by this assay, and inadequate number of viral copies(<138 copies/mL). A negative result must be combined with clinical observations, patient history, and epidemiological information. The expected result is Negative.  Fact Sheet for Patients:  EntrepreneurPulse.com.au  Fact Sheet for Healthcare Providers:  IncredibleEmployment.be  This test is no t yet approved or cleared by the [place] FDA and  has been authorized for detection and/or diagnosis of SARS-CoV-2 by FDA under an Emergency Use Authorization (EUA). This EUA will remain  in effect (meaning this test can be used) for the duration of the COVID-19 declaration under Section 564(b)(1) of the Act, 21 U.S.C.section 360bbb-3(b)(1), unless the authorization is terminated  or revoked sooner.       Influenza A by PCR NEGATIVE NEGATIVE Final   Influenza B by PCR NEGATIVE NEGATIVE Final    Comment: (NOTE) The Xpert Xpress SARS-CoV-2/FLU/RSV plus assay is intended as an aid in the diagnosis of influenza from Nasopharyngeal swab specimens and should not be used as a sole basis for treatment. Nasal washings and aspirates are unacceptable for Xpert Xpress SARS-CoV-2/FLU/RSV testing.  Fact Sheet for Patients: EntrepreneurPulse.com.au  Fact Sheet for Healthcare Providers: IncredibleEmployment.be  This test is not yet approved or cleared by the [place] FDA and has been authorized for detection and/or diagnosis of SARS-CoV-2 by FDA under an Emergency Use Authorization (EUA). This EUA will remain in effect (meaning this test can be used) for the duration of the COVID-19 declaration under Section 564(b)(1) of the Act, 21 U.S.C. section  360bbb-3(b)(1), unless the authorization is terminated or revoked.  Performed at [facility]  [facility], [place] [address] [place], [place] [address]   MRSA Next Gen by PCR, Nasal     Status: None   Collection Time: 02/08/2022  4:53 PM   Specimen: Nasal Mucosa; Nasal Swab  Result Value Ref Range Status   MRSA by PCR Next Gen NOT DETECTED NOT DETECTED Final    Comment: (NOTE) The GeneXpert MRSA Assay (FDA approved for NASAL specimens only), is one component of a comprehensive MRSA colonization surveillance program. It is not intended to diagnose MRSA infection nor to guide or monitor treatment for MRSA infections. Test performance is not FDA approved in patients less than 62 years old. Performed at [facility], [place] [address] [place], [place] [address]   Peritoneal fluid culture w Gram Stain     Status: None   Collection Time: 02/14/22  2:50 PM   Specimen: Peritoneal Fluid  Result Value Ref Range Status   Specimen Description FLUID PERITONEAL  Final   Special Requests NONE  Final   Gram Stain   Final    RARE WBC PRESENT, PREDOMINANTLY MONONUCLEAR NO ORGANISMS SEEN    Culture   Final    NO GROWTH 3 DAYS Performed at [facility], [place] [address] [place], [place] [address]    Report Status 02/18/2022 FINAL  Final         Radiology Studies: No results found.      Scheduled Meds:  carvedilol  3.125 mg Oral Daily   chlorhexidine gluconate (MEDLINE KIT)  15 mL Mouth Rinse BID   Chlorhexidine Gluconate Cloth  6 each Topical Daily   feeding supplement  237 mL Oral TID BM   folic acid  1 mg Oral Daily   furosemide  40 mg Oral Daily   heparin injection (subcutaneous)  5,000 Units Subcutaneous Q8H   iron polysaccharides  150 mg Oral Daily   lactulose  20 g Oral BID   multivitamin with minerals  1 tablet Oral Daily   nicotine  21 mg Transdermal Daily   pantoprazole  40 mg Oral BID   rifaximin  550 mg Oral BID   sodium chloride flush  10-40 mL Intracatheter Q12H    spironolactone  50 mg Oral Daily   thiamine  100 mg Oral Daily   Continuous Infusions:  sodium chloride 10 mL/hr at 02/17/22 1100   dextrose 50 mL/hr at 02/17/22 1500     LOS: 5 days       Phillips Climes, MD [facility]   To contact the attending provider between 7A-7P or the covering provider during after hours 7P-7A, please log into the web site [email or web address] and access using universal [place] password for that web site. If you do not have the password, please call the hospital operator.  02/18/2022, 3:02 PM

## 2022-02-18 NOTE — H&P (Incomplete)
Physical Medicine and Rehabilitation Admission H&P    Chief Complaint  Patient presents with   Near Syncope   Emesis   Diarrhea  : HPI: Harold Howard is a 40 year old right-handed male with history of tobacco/alcohol use on no prescription medications.  Per chart review patient lives with his roommate and 64 a 28-year-old child.  1 level home 3 steps to entry.  Independent prior to admission.  His family is reportedly estranged due to his alcohol consumption.  Patient works at [employer].  Presented 102/02/2022 with reportedly 1 month of vomiting blood and dark stools.  Patient did receive a 500 cc crystalloid and Zofran.  He was noted to be hypotensive with altered mental status.  He reportedly had a seizure in the ED with Ativan added as needed.  Patient required intubation for airway protection.  Cranial CT scan negative.  Ultrasound the abdomen showed cirrhotic hepatic morphology.  Liver parenchyma diffusely heterogeneous which limited assessment for focal lesion.  Moderate abdominopelvic ascites.  Admission chemistries unremarkable aside glucose 111 BUN 29 creatinine 1.86 AST 86 total bilirubin 4.1, hemoglobin 1.9, troponin 68, ammonia 144, lactic acid greater than 9.  Patient received 8 units packed red blood cells.  Underwent upper endoscopy 02/12/2022 per Dr. 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  They were large in size especially in the distal esophagus.  Underwent paracentesis 02/14/2022 with 40 cc of yellow appearing fluid drained.  His hemoglobin is stabilized 9.7 and AKI improved latest creatinine 0.9 .  Patient was cleared to begin subcutaneous heparin for DVT prophylaxis.  He has been advanced to a regular diet.  Therapy evaluations completed due to patient decreased functional mobility was admitted for a comprehensive rehab program.  Review of Systems  Constitutional:  Negative for chills  and fever.  HENT:  Negative for hearing loss.   Eyes:  Negative for blurred vision and double vision.  Respiratory:  Negative for cough and shortness of breath.   Cardiovascular:  Positive for leg swelling. Negative for chest pain and palpitations.  Gastrointestinal:  Positive for blood in stool, nausea and vomiting.  Genitourinary:  Negative for dysuria, flank pain and hematuria.  Skin:  Negative for rash.  Neurological:  Positive for weakness.  All other systems reviewed and are negative. Past Medical History:  Diagnosis Date   ETOH abuse    Past Surgical History:  Procedure Laterality Date   BACK SURGERY     ESOPHAGEAL BANDING  02/20/2022   Procedure: ESOPHAGEAL BANDING;  Surgeon: Yetta Flock, MD;  Location: [facility];  Service: Gastroenterology;;   ESOPHAGOGASTRODUODENOSCOPY N/A 02/13/2022   Procedure: ESOPHAGOGASTRODUODENOSCOPY (EGD);  Surgeon: Yetta Flock, MD;  Location: [facility];  Service: Gastroenterology;  Laterality: N/A;   Family History  Problem Relation Age of Onset   Alcoholism Mother    Social History:  reports that he has quit smoking. His smoking use included cigarettes. He has never used smokeless tobacco. He reports current alcohol use. He reports that he does not use drugs. Allergies: No Known Allergies No medications prior to admission.      Home: Home Living Family/patient expects to be discharged to:: Private residence Living Arrangements: Non-relatives/Friends, Children (roommates, 75 and 84 yo children) Available Help at Discharge: Family, Available PRN/intermittently Type of Home: Mobile home Home Access: Stairs to enter Entrance Stairs-Number of Steps: 3 + 1 Entrance Stairs-Rails: Right, Left Home Layout: One level Bathroom Shower/Tub: Tub  only, Walk-in shower, Chiropodist: Standard Home Equipment: None Additional Comments: significant other does not live with pt d/t his hx of etoh abuse   Functional  History: Prior Function Prior Level of Function : Independent/Modified Independent, Working/employed, Driving Mobility Comments: no use of AD ADLs Comments: Works at [employer]:  Mobility: Bed Mobility Overal bed mobility: Needs Assistance Bed Mobility: Supine to Sit Supine to sit: Min assist, HOB elevated General bed mobility comments: min A for pad scoot to EoB, increased time and difficulty problem solving how to offweight hips Transfers Overall transfer level: Needs assistance Equipment used: Rolling walker (2 wheels) Transfers: Sit to/from Stand, Bed to chair/wheelchair/BSC Sit to Stand: Mod assist, From elevated surface Bed to/from chair/wheelchair/BSC transfer type:: Step pivot Step pivot transfers: Max assist General transfer comment: good power up but requires modA for steadying in upright and keeping RW down on floor due to posterior lean, max A for stepping to recliner due to increased posterior lean Ambulation/Gait Pre-gait activities: marching in place increased effort for lifting LE off floor    ADL: ADL Overall ADL's : Needs assistance/impaired Grooming: Maximal assistance, Sitting Upper Body Bathing: Maximal assistance, Sitting Lower Body Bathing: Maximal assistance, +2 for physical assistance, +2 for safety/equipment, Sit to/from stand Upper Body Dressing : Maximal assistance Lower Body Dressing: Maximal assistance, +2 for physical assistance, +2 for safety/equipment, Sit to/from stand Toilet Transfer: +2 for physical assistance, +2 for safety/equipment, Stand-pivot, Maximal assistance Toileting- Clothing Manipulation and Hygiene: Total assistance Toileting - Clothing Manipulation Details (indicate cue type and reason): flexiseal and foley cath General ADL Comments: Noted deficits in cognition, standing balance and strength. Questionable vision impairments  Cognition: Cognition Overall Cognitive Status: Impaired/Different from  baseline Orientation Level: Oriented X4 Cognition Arousal/Alertness: Awake/alert Behavior During Therapy: Flat affect, Restless Overall Cognitive Status: Impaired/Different from baseline Area of Impairment: Orientation, Attention, Memory, Following commands, Safety/judgement, Awareness, Problem solving Orientation Level: Disoriented to, Time, Situation Current Attention Level: Focused Memory: Decreased short-term memory Following Commands: Follows one step commands with increased time, Follows one step commands inconsistently, Follows multi-step commands inconsistently, Follows multi-step commands with increased time Safety/Judgement: Decreased awareness of deficits, Decreased awareness of safety Awareness: Intellectual Problem Solving: Slow processing, Decreased initiation, Difficulty sequencing, Requires verbal cues, Requires tactile cues General Comments: processing slightly improved today, short term memory is very impaired cannot recall 3 words asked to remember,  Physical Exam: Blood pressure 95/62, pulse 70, temperature 97.9 F (36.6 C), temperature source Oral, resp. rate 19, height 5\' 9"  (1.753 m), weight 92.5 kg, SpO2 94 %. Physical Exam Neurological:     Comments: Patient is awake and alert following commands.  Provides name and age.    Results for orders placed or performed during the hospital encounter of 02/14/2022 (from the past 48 hour(s))  Protime-INR     Status: Abnormal   Collection Time: 02/20/22  6:35 AM  Result Value Ref Range   Prothrombin Time 18.5 (H) 11.4 - 15.2 seconds   INR 1.5 (H) 0.8 - 1.2    Comment: (NOTE) INR goal varies based on device and disease states. Performed at [facility], [place] [address], [place], [place] [address]   APTT     Status: Abnormal   Collection Time: 02/20/22  6:35 AM  Result Value Ref Range   aPTT 44 (H) 24 - 36 seconds    Comment:        IF BASELINE aPTT IS ELEVATED, SUGGEST PATIENT RISK ASSESSMENT BE USED TO  DETERMINE APPROPRIATE ANTICOAGULANT THERAPY. Performed at [facility], [place] [address], [place], [place] [address]   Comprehensive metabolic panel     Status: Abnormal   Collection Time: 02/20/22  6:35 AM  Result Value Ref Range   Sodium 132 (L) 135 - 145 mmol/L   Potassium 4.3 3.5 - 5.1 mmol/L   Chloride 99 98 - 111 mmol/L   CO2 24 22 - 32 mmol/L   Glucose, Bld 103 (H) 70 - 99 mg/dL    Comment: Glucose reference range applies only to samples taken after fasting for at least 8 hours.   BUN 17 6 - 20 mg/dL   Creatinine, Ser 0.92 0.61 - 1.24 mg/dL   Calcium 8.0 (L) 8.9 - 10.3 mg/dL   Total Protein 5.0 (L) 6.5 - 8.1 g/dL   Albumin 2.1 (L) 3.5 - 5.0 g/dL   AST 76 (H) 15 - 41 U/L   ALT 56 (H) 0 - 44 U/L   Alkaline Phosphatase 96 38 - 126 U/L   Total Bilirubin 5.3 (H) 0.3 - 1.2 mg/dL   GFR, Estimated >60 >60 mL/min    Comment: (NOTE) Calculated using the CKD-EPI Creatinine Equation (2021)    Anion gap 9 5 - 15    Comment: Performed at [facility] [address] [place], [place] [address]  CBC     Status: Abnormal   Collection Time: 02/20/22  6:35 AM  Result Value Ref Range   WBC 9.8 4.0 - 10.5 K/uL   RBC 3.12 (L) 4.22 - 5.81 MIL/uL   Hemoglobin 8.8 (L) 13.0 - 17.0 g/dL   HCT 27.8 (L) 39.0 - 52.0 %   MCV 89.1 80.0 - 100.0 fL   MCH 28.2 26.0 - 34.0 pg   MCHC 31.7 30.0 - 36.0 g/dL   RDW 17.9 (H) 11.5 - 15.5 %   Platelets 75 (L) 150 - 400 K/uL    Comment: Immature Platelet Fraction may be clinically indicated, consider ordering this additional test [id number] CONSISTENT WITH PREVIOUS RESULT REPEATED TO VERIFY    nRBC 0.0 0.0 - 0.2 %    Comment: Performed at [facility], [place] [address] [place], [place] [address]  Ammonia     Status: Abnormal   Collection Time: 02/20/22  6:35 AM  Result Value Ref Range   Ammonia 58 (H) 9 - 35 umol/L    Comment: Performed at [facility], [place] [address], [place], [place] [address]  Phosphorus     Status: Abnormal    Collection Time: 02/20/22  6:35 AM  Result Value Ref Range   Phosphorus 2.2 (L) 2.5 - 4.6 mg/dL    Comment: Performed at [facility] [address], [place], [place] [address]  Protime-INR     Status: Abnormal   Collection Time: 01/30/2022 12:42 AM  Result Value Ref Range   Prothrombin Time 17.3 (H) 11.4 - 15.2 seconds   INR 1.4 (H) 0.8 - 1.2    Comment: (NOTE) INR goal varies based on device and disease states. Performed at [facility], [place] [address], [place],  [address]   APTT     Status: Abnormal   Collection Time: 02/04/2022 12:42 AM  Result Value Ref Range   aPTT 40 (H) 24 - 36 seconds    Comment:        IF BASELINE aPTT IS ELEVATED, SUGGEST PATIENT RISK ASSESSMENT BE USED TO DETERMINE APPROPRIATE ANTICOAGULANT THERAPY. Performed at [facility], [address], [place], [place] [address]   CBC     Status: Abnormal   Collection Time: 02/18/2022 12:42 AM  Result Value Ref Range   WBC 12.2 (H) 4.0 - 10.5 K/uL   RBC 3.44 (L) 4.22 - 5.81 MIL/uL   Hemoglobin 9.7 (L) 13.0 - 17.0 g/dL   HCT 30.8 (L) 39.0 - 52.0 %   MCV 89.5 80.0 - 100.0 fL   MCH 28.2 26.0 - 34.0 pg   MCHC 31.5 30.0 - 36.0 g/dL   RDW 18.3 (H) 11.5 - 15.5 %   Platelets 90 (L) 150 - 400 K/uL    Comment: Immature Platelet Fraction may be clinically indicated, consider ordering this additional test [id number] REPEATED TO VERIFY PLATELET COUNT CONFIRMED BY SMEAR    nRBC 0.0 0.0 - 0.2 %    Comment: Performed at [facility], [place] [address] [place], [place] [address]  Comprehensive metabolic panel     Status: Abnormal   Collection Time: 02/02/2022 12:42 AM  Result Value Ref Range   Sodium 130 (L) 135 - 145 mmol/L   Potassium 5.5 (H) 3.5 - 5.1 mmol/L   Chloride 97 (L) 98 - 111 mmol/L   CO2 25 22 - 32 mmol/L   Glucose, Bld 97 70 - 99 mg/dL    Comment: Glucose reference range applies only to samples taken after fasting for at least 8 hours.   BUN 18 6 - 20 mg/dL   Creatinine, Ser  0.90 0.61 - 1.24 mg/dL   Calcium 8.1 (L) 8.9 - 10.3 mg/dL   Total Protein 5.6 (L) 6.5 - 8.1 g/dL   Albumin 2.2 (L) 3.5 - 5.0 g/dL   AST 93 (H) 15 - 41 U/L   ALT 55 (H) 0 - 44 U/L   Alkaline Phosphatase 106 38 - 126 U/L   Total Bilirubin 6.0 (H) 0.3 - 1.2 mg/dL   GFR, Estimated >60 >60 mL/min    Comment: (NOTE) Calculated using the CKD-EPI Creatinine Equation (2021)    Anion gap 8 5 - 15    Comment: Performed at [facility], [place] [address] [place], [place] [address]  Phosphorus     Status: None   Collection Time: 02/01/2022 12:42 AM  Result Value Ref Range   Phosphorus 2.7 2.5 - 4.6 mg/dL    Comment: Performed at [facility] [address] [place], [place] [address]   No results found.    Blood pressure 95/62, pulse 70, temperature 97.9 F (36.6 C), temperature source Oral, resp. rate 19, height 5\' 9"  (1.753 m), weight 92.5 kg, SpO2 94 %.  Medical Problem List and Plan: 1. Functional deficits secondary to hepatic encephalopathy/hemorrhagic shock  -patient may *** shower  -ELOS/Goals: *** 2.  Antithrombotics: -DVT/anticoagulation:  Mechanical:  Antiembolism stockings, knee (TED hose) Bilateral lower extremities.  Subcutaneous heparin  -antiplatelet therapy: N/A 3. Pain Management: N/A 4. Mood: Provide emotional support  -antipsychotic agents: N/A 5. Neuropsych: This patient is capable of making decisions on his own behalf. 6. Skin/Wound Care: Routine skin checks 7. Fluids/Electrolytes/Nutrition: Routine in and outs with follow-up chemistries.  Hyponatremia 2000 mL fluid restriction 8.  Acute upper GI bleed/thrombocytopenia.  Patient with multiple transfusions.  Continue iron supplement.  Follow-up CBC.  Protonix twice daily 9.  Alcohol use/decompensated liver cirrhosis.  Counseling 10.  Thrombocytopenia.  Most likely related to alcohol use.  Follow-up labs 11.  Tobacco use.  NicoDerm patch.  Provide counseling 12.  Hypertension.  Aldactone  50 mg daily, Lasix  40 mg daily, Coreg 3.125 mg twice daily.  Lavon Paganini Normalee Sistare, PA-C 02/01/2022

## 2022-02-18 NOTE — Progress Notes (Signed)
Physical Therapy Treatment Patient Details Name: Harold Howard MRN: [id number] DOB: 10-28-1981 Today's Date: 02/18/2022   History of Present Illness 41 y/o gentleman Arrived via EMS after 911 call s/p toilet bowl full of BRB. Presents with 1 month of vomiting blood and dark stools consistent with upper GI bleed  Pt hypotensive and developed worsening encephalopathy in the ED and required intubation. He had a seizure in the ED.  He received 8 pRBCs. s/p EGD on 2/19 with banding of esophageal varices.  2/20 paracentesis. Self extubated 2/21 PMH:  ETOH use half gallon every 2-3 days    PT Comments    Pt more alert today on entry, and requiring less assist today, however continues to be limited by decreased cognition, in particular safety awareness, and sequencing, as well as decreased coordination and balance. Pt is mod A for bed mobility and maxAx for stepping transfer to chair. Pt out of breath with short transfer to chair although SpO2 on 2L O2 96%O2. Pt asleep by the time foot rests elevated. D/c plan remains appropriate. PT will continue to follow acutely.    Recommendations for follow up therapy are one component of a multi-disciplinary discharge planning process, led by the attending physician.  Recommendations may be updated based on patient status, additional functional criteria and insurance authorization.  Follow Up Recommendations  Acute inpatient rehab (3hours/day)     Assistance Recommended at Discharge Frequent or constant Supervision/Assistance  Patient can return home with the following A lot of help with walking and/or transfers;A lot of help with bathing/dressing/bathroom;Assistance with cooking/housework;Assistance with feeding;Direct supervision/assist for medications management;Direct supervision/assist for financial management;Assist for transportation;Help with stairs or ramp for entrance   Equipment Recommendations  Rolling walker (2 wheels);BSC/3in1    Recommendations  for Other Services Rehab consult     Precautions / Restrictions Precautions Precautions: Fall Precaution Comments: seziure in ED, femoral central line, flexiseal, urinary catheter, Restrictions Weight Bearing Restrictions: No     Mobility  Bed Mobility Overal bed mobility: Needs Assistance Bed Mobility: Supine to Sit     Supine to sit: Min assist, HOB elevated     General bed mobility comments: min A for pad scoot to EoB, increased time and difficulty problem solving how to offweight hips    Transfers Overall transfer level: Needs assistance Equipment used: Rolling walker (2 wheels) Transfers: Sit to/from Stand, Bed to chair/wheelchair/BSC Sit to Stand: Mod assist, From elevated surface   Step pivot transfers: Max assist       General transfer comment: good power up but requires modA for steadying in upright and keeping RW down on floor due to posterior lean, max A for stepping to recliner due to increased posterior lean           Balance Overall balance assessment: Needs assistance Sitting-balance support: Feet supported, No upper extremity supported Sitting balance-Leahy Scale: Fair     Standing balance support: During functional activity, Bilateral upper extremity supported Standing balance-Leahy Scale: Zero Standing balance comment: requires outside assit for balance                                  General Comments General comments (skin integrity, edema, etc.): VSS on 2L O2 via [place]      Pertinent Vitals/Pain Pain Assessment Pain Assessment: No/denies pain     PT Goals (current goals can now be found in the care plan section) Acute Rehab PT Goals Patient Stated  Goal: go home PT Goal Formulation: With patient/family Time For Goal Achievement: 03/03/22 Potential to Achieve Goals: Fair Progress towards PT goals: Progressing toward goals    Frequency    Min 3X/week      PT Plan Current plan remains appropriate       AM-PAC  PT "6 Clicks" Mobility   Outcome Measure  Help needed turning from your back to your side while in a flat bed without using bedrails?: A Little Help needed moving from lying on your back to sitting on the side of a flat bed without using bedrails?: Total Help needed moving to and from a bed to a chair (including a wheelchair)?: Total Help needed standing up from a chair using your arms (e.g., wheelchair or bedside chair)?: Total Help needed to walk in hospital room?: Total Help needed climbing 3-5 steps with a railing? : Total 6 Click Score: 8    End of Session Equipment Utilized During Treatment: Gait belt Activity Tolerance: Patient tolerated treatment well (fatigues quickly once in chair dozes off) Patient left: in chair;with call bell/phone within reach;with chair alarm set;with family/visitor present Nurse Communication: Mobility status PT Visit Diagnosis: Unsteadiness on feet (R26.81);Other abnormalities of gait and mobility (R26.89);Muscle weakness (generalized) (M62.81);Difficulty in walking, not elsewhere classified (R26.2);Other symptoms and signs involving the nervous system (E32.122)     Time: 4825-0037 PT Time Calculation (min) (ACUTE ONLY): 31 min  Charges:  $Therapeutic Activity: 8-22 mins                     Harold Howard PT, DPT Acute Rehabilitation Services Pager [phone] Office [phone]    Harold Howard 02/18/2022, 2:08 PM

## 2022-02-18 NOTE — Progress Notes (Signed)
°   02/18/22 1255  Assess: MEWS Score  Temp 98.3 F (36.8 C)  BP (!) 93/59  Pulse Rate 99  ECG Heart Rate (!) 103  Resp 16  SpO2 (!) 85 %  Assess: MEWS Score  MEWS Temp 0  MEWS Systolic 1  MEWS Pulse 1  MEWS RR 0  MEWS LOC 0  MEWS Score 2  MEWS Score Color Yellow  Assess: if the MEWS score is Yellow or Red  Were vital signs taken at a resting state? Yes  Focused Assessment No change from prior assessment  Early Detection of Sepsis Score *See Row Information* Low  MEWS guidelines implemented *See Row Information* Yes  Treat  MEWS Interventions Escalated (See documentation below)  Pain Scale 0-10  Pain Score 0  Take Vital Signs  Increase Vital Sign Frequency  Yellow: Q 2hr X 2 then Q 4hr X 2, if remains yellow, continue Q 4hrs  Escalate  MEWS: Escalate Yellow: discuss with charge nurse/RN and consider discussing with provider and RRT  Notify: Charge Nurse/RN  Name of Charge Nurse/RN Notified Delcine, RN  Date Charge Nurse/RN Notified 02/18/22  Time Charge Nurse/RN Notified 1300

## 2022-02-19 LAB — CBC
HCT: 26.8 % — ABNORMAL LOW (ref 39.0–52.0)
Hemoglobin: 8.4 g/dL — ABNORMAL LOW (ref 13.0–17.0)
MCH: 28.4 pg (ref 26.0–34.0)
MCHC: 31.3 g/dL (ref 30.0–36.0)
MCV: 90.5 fL (ref 80.0–100.0)
Platelets: 56 10*3/uL — ABNORMAL LOW (ref 150–400)
RBC: 2.96 MIL/uL — ABNORMAL LOW (ref 4.22–5.81)
RDW: 18 % — ABNORMAL HIGH (ref 11.5–15.5)
WBC: 9.8 10*3/uL (ref 4.0–10.5)
nRBC: 0.2 % (ref 0.0–0.2)

## 2022-02-19 LAB — COMPREHENSIVE METABOLIC PANEL
ALT: 70 U/L — ABNORMAL HIGH (ref 0–44)
AST: 83 U/L — ABNORMAL HIGH (ref 15–41)
Albumin: 2 g/dL — ABNORMAL LOW (ref 3.5–5.0)
Alkaline Phosphatase: 95 U/L (ref 38–126)
Anion gap: 7 (ref 5–15)
BUN: 14 mg/dL (ref 6–20)
CO2: 25 mmol/L (ref 22–32)
Calcium: 7.8 mg/dL — ABNORMAL LOW (ref 8.9–10.3)
Chloride: 104 mmol/L (ref 98–111)
Creatinine, Ser: 0.88 mg/dL (ref 0.61–1.24)
GFR, Estimated: >60 mL/min (ref >60)
Glucose, Bld: 113 mg/dL — ABNORMAL HIGH (ref 70–99)
Potassium: 3.4 mmol/L — ABNORMAL LOW (ref 3.5–5.1)
Sodium: 136 mmol/L (ref 135–145)
Total Bilirubin: 4.5 mg/dL — ABNORMAL HIGH (ref 0.3–1.2)
Total Protein: 4.7 g/dL — ABNORMAL LOW (ref 6.5–8.1)

## 2022-02-19 LAB — AMMONIA: Ammonia: 69 umol/L — ABNORMAL HIGH (ref 9–35)

## 2022-02-19 LAB — APTT: aPTT: 42 seconds — ABNORMAL HIGH (ref 24–36)

## 2022-02-19 LAB — PROTIME-INR
INR: 1.9 — ABNORMAL HIGH (ref 0.8–1.2)
Prothrombin Time: 21.5 seconds — ABNORMAL HIGH (ref 11.4–15.2)

## 2022-02-19 MED ORDER — POTASSIUM CHLORIDE CRYS ER 20 MEQ PO TBCR
30.0000 meq | EXTENDED_RELEASE_TABLET | Freq: Four times a day (QID) | ORAL | Status: AC
  Administered 2022-02-19 (×2): 30 meq via ORAL
  Filled 2022-02-19 (×2): qty 1

## 2022-02-19 MED ORDER — POTASSIUM CHLORIDE CRYS ER 20 MEQ PO TBCR: 40.0000 meq | EXTENDED_RELEASE_TABLET | Freq: Four times a day (QID) | ORAL | Status: DC

## 2022-02-19 NOTE — Progress Notes (Signed)
PROGRESS NOTE    Harold Howard  TDH:[id number] DOB: 10/09/1981 DOA: 01/29/2022 PCP: Patient, No Pcp Per (Inactive)     Chief Complaint  Patient presents with   Near Syncope   Emesis   Diarrhea    Brief Narrative:    Mr. Banghart is a 41 y/o gentleman with a history of daily ETOH use who presents with 1 month of vomiting blood and dark stools consistent with upper GI bleed s/p EGD on 2/19 with banding of esophageal varices.    History is provided by the ED and his step sister as he is intubated and sedated. His family is estranged due to his ETOH consumption. His step-sister indicated she is the only family he is in contact with. He drinks about a fifth per day, but has been working on cutting back recently, but he still drinks about a half gallon of liquor every 2-3 days. He came home from work early yesterday after texting his roommate a picture of a toilet bowl full of BRB. He was found on the floor this morning screaming to call 911. No emesis with EMS. Received 500cc crystalloid and zofran. He was hypotensive and developed worsening encephalopathy in the ED and required intubation. He had a seizure in the ED.  He received 8 pRBCs. PCCM consulted for admission. GI consulted by the ED.   No known use of NSAIDs or anticoagulants. His step-sister is not clear on his medical history. 2/19 admission, intubated sedated, EGD  2/20 paracentesis  2/22 patient self-extubated, off pressors     Assessment & Plan:   Principal Problem:   Upper GI bleed Active Problems:   ETOH abuse   Hypovolemic shock (HCC)   Bleeding esophageal varices (HCC)   Malnutrition of moderate degree   Hemorrhagic shock Acute on subacute upper GIB, Esophageal Varices  Acute blood loss anemia  -Hemorrhagic  shock resolved, off pressors, but blood pressure remains soft - Continue pantoprazole IV BID switch to PO as tolerated  - Continue Ceftriaxone for SBP prophylaxis for 5 days initiated on 2/19  -  Recommend ETOH cessation; can counsel when appropriate -Tolerating regular diet - Iron supplementation   - Monitor electrolytes and supplement  -Monitor CBC closely,  slowly drifting down, but so far no indication for transfusion. -Follow-up with GI in 4 to 6 weeks with upper endoscopy in 6 to 8 weeks with further EVL if needed   Decompensated alcoholic cirrhosis  Ammonia downtrending to 44. Hyperbilirubinemia and elevated transaminase due to hypoperfusion,  ETOH abuse.  - 2/19 RUQ [place]: cirrhotic hepatic morphology, abdominopelvic ascites, no gallstones or biliary dilation, bidirectional flow is suspected in the main portal vein with varicosities at the hepatic hilum. MELD score 24. Paracentesis on 2/20 not consistent with SBP. SAAG is 1 g/dl. Liver work up negative thus far and etiology of cirrhosis is likely secondary to alcohol use. Peritoneal fluid culture showed no growth.  -Patient with multiple bowel movements and diarrhea, on Flexi-Seal, but I will hold on decreasing lactulose given ammonia level remains elevated and slightly trending up.   - Outpatient GI follow up  - ETOH cessation   - ANA pending    -Low-salt diet, normal protein diet -Started on rifaximin -  on Lasix and Aldactone. -Avoid NSAIDs -Started on Coreg -He will likely need paracentesis in couple days, hopefully when blood pressure has improved.  Acute respiratory failure with hypoxia - Continue [place] with SpO2 > 88% , wean as tolerated    Hypernatremia  -Resolved, DC  D5 W   Hypokalemia K of 3.2 - Replete with goal K > 4    Thrombocytopenia  Iron deficiency  Ferritin came back as 22.  - Iron supplementation    ETOH abuse - recommend cessation - vitamins when able to take PO - monitor for withdrawal - Phenobarbital taper ending on 2/25    AKI, resolved - maintain adequate perfusion - strict I/Os - renally dose meds, avoid nephrotoxic meds   Prolonged Qtc  5/19 Qtc 511. Not currently on QT prolonging  medications.  - Avoid high risk QT prolonging medications - Continue to monitor    Seizure, likely due to profound hypoperfusion and hypoxia Head CT on 2/19 showed no acute intracranial pathology.  - if recurrent, add keppra - ativan PRN - seizure precautions   DVT prophylaxis: SCD  Code Status: Full Family Communication: None at bedside Disposition:   Status is: Inpatient Remains inpatient appropriate because: Acutely ill with liver failure.    Procedures:  - 2/19 EGD: EGD yesterday with large esophageal varices, suspected gastric varices, and portal hypertensive gastropathy    Antibiotics:  - 2/19 Ceftriaxone for SBP prophylaxis          Consultants:  PCCM GI    Subjective:  No significant events overnight as discussed with staff.  [place] with loose bowel movement on Flexi-Seal.  Objective: Vitals:   02/19/22 0600 02/19/22 0819 02/19/22 0900 02/19/22 1218  BP:  (!) 89/60 (!) 98/56 (!) 84/58  Pulse: 85 77  89  Resp: 18 19  19   Temp:  98.1 F (36.7 C)  98 F (36.7 C)  TempSrc:  Oral  Oral  SpO2: 99% 99%  98%  Weight:      Height:        Intake/Output Summary (Last 24 hours) at 02/19/2022 1529 Last data filed at 02/19/2022 1221 Gross per 24 hour  Intake 720 ml  Output 200 ml  Net 520 ml   Filed Weights   02/17/22 2047 02/18/22 0500 02/19/22 0500  Weight: 91.2 kg 91.2 kg 91.3 kg    Examination:  Awake Alert, Oriented X 3, ill-appearing male. Symmetrical Chest wall movement, Good air movement bilaterally, CTAB RRR,No Gallops,Rubs or new Murmurs, No Parasternal Heave +ve B.Sounds, abdomen nontender, but with significant ascites  no Cyanosis, Clubbing, has significant scrotal/penile edema, +1 lower extremity edema.     Data Reviewed: I have personally reviewed following labs and imaging studies  CBC: Recent Labs  Lab 02/15/22 0332 02/15/22 1559 02/16/22 0234 02/16/22 0318 02/17/22 0313 02/18/22 0703 02/19/22 0056  WBC 11.0* 10.8*  --   9.6 11.3* 8.8 9.8  NEUTROABS 8.9* 8.6*  --  7.5 8.4* 5.6  --   HGB 8.7* 8.7* 8.8* 8.9* 9.1* 9.3* 8.4*  HCT 26.3* 26.4* 26.0* 27.4* 28.4* 29.5* 26.8*  MCV 87.7 88.6  --  89.8 91.3 91.9 90.5  PLT 66* 77*  --  80* 75* 64* 56*    Basic Metabolic Panel: Recent Labs  Lab 02/16/2022 1653 02/08/2022 1703 02/15/22 0332 02/16/22 0234 02/16/22 0318 02/17/22 0313 02/18/22 0703 02/19/22 0056  NA  --    < > 144 146* 144 149* 141 136  K  --    < > 3.7 3.8 3.8 3.2* 3.1* 3.4*  CL  --    < > 109  --  113* 115* 110 104  CO2  --    < > 25  --  25 26 24 25   GLUCOSE  --    < >  140*  --  153* 125* 124* 113*  BUN  --    < > 41*  --  41* 31* 17 14  CREATININE  --    < > 1.72*  --  1.45* 1.23 1.03 0.88  CALCIUM  --    < > 8.2*  --  8.3* 8.5* 8.2* 7.8*  MG 1.6*  --  2.3  --  2.3  --   --   --   PHOS 4.8*  --  4.6  --  3.5  --   --   --    < > = values in this interval not displayed.    GFR: Estimated Creatinine Clearance: 124.5 mL/min (by C-G formula based on SCr of 0.88 mg/dL).  Liver Function Tests: Recent Labs  Lab 02/14/22 0550 02/15/22 0332 02/16/22 0318 02/17/22 0313 02/19/22 0056  AST 636* 593* 310* 160* 83*  ALT 108* 149* 125* 103* 70*  ALKPHOS 73 87 81 83 95  BILITOT 7.3* 6.4* 6.1* 6.9* 4.5*  PROT 5.0* 5.5* 5.4* 5.2* 4.7*  ALBUMIN 2.4* 2.5* 2.4* 2.4* 2.0*    CBG: Recent Labs  Lab 02/17/22 1203 02/17/22 1626 02/17/22 1938 02/18/22 0035 02/18/22 0449  GLUCAP 143* 132* 112* 98 104*     Recent Results (from the past 240 hour(s))  Resp Panel by RT-PCR (Flu A&B, Covid) Nasopharyngeal Swab     Status: None   Collection Time: 02/22/2022 11:36 AM   Specimen: Nasopharyngeal Swab; Nasopharyngeal(NP) swabs in vial transport medium  Result Value Ref Range Status   SARS Coronavirus 2 by RT PCR NEGATIVE NEGATIVE Final    Comment: (NOTE) SARS-CoV-2 target nucleic acids are NOT DETECTED.  The SARS-CoV-2 RNA is generally detectable in upper respiratory specimens during the acute phase  of infection. The lowest concentration of SARS-CoV-2 viral copies this assay can detect is 138 copies/mL. A negative result does not preclude SARS-Cov-2 infection and should not be used as the sole basis for treatment or other patient management decisions. A negative result may occur with  improper specimen collection/handling, submission of specimen other than nasopharyngeal swab, presence of viral mutation(s) within the areas targeted by this assay, and inadequate number of viral copies(<138 copies/mL). A negative result must be combined with clinical observations, patient history, and epidemiological information. The expected result is Negative.  Fact Sheet for Patients:  BloggerCourse.comhttps://www.fda.gov/media/152166/download  Fact Sheet for Healthcare Providers:  SeriousBroker.ithttps://www.fda.gov/media/152162/download  This test is no t yet approved or cleared by the [place] FDA and  has been authorized for detection and/or diagnosis of SARS-CoV-2 by FDA under an Emergency Use Authorization (EUA). This EUA will remain  in effect (meaning this test can be used) for the duration of the COVID-19 declaration under Section 564(b)(1) of the Act, 21 U.S.C.section 360bbb-3(b)(1), unless the authorization is terminated  or revoked sooner.       Influenza A by PCR NEGATIVE NEGATIVE Final   Influenza B by PCR NEGATIVE NEGATIVE Final    Comment: (NOTE) The Xpert Xpress SARS-CoV-2/FLU/RSV plus assay is intended as an aid in the diagnosis of influenza from Nasopharyngeal swab specimens and should not be used as a sole basis for treatment. Nasal washings and aspirates are unacceptable for Xpert Xpress SARS-CoV-2/FLU/RSV testing.  Fact Sheet for Patients: BloggerCourse.comhttps://www.fda.gov/media/152166/download  Fact Sheet for Healthcare Providers: SeriousBroker.ithttps://www.fda.gov/media/152162/download  This test is not yet approved or cleared by the [place] FDA and has been authorized for detection and/or diagnosis of  SARS-CoV-2 by FDA under an Emergency Use Authorization (EUA). This  EUA will remain in effect (meaning this test can be used) for the duration of the COVID-19 declaration under Section 564(b)(1) of the Act, 21 U.S.C. section 360bbb-3(b)(1), unless the authorization is terminated or revoked.  Performed at [facility], [address], [place], [place] [address]   MRSA Next Gen by PCR, Nasal     Status: None   Collection Time: 02/06/2022  4:53 PM   Specimen: Nasal Mucosa; Nasal Swab  Result Value Ref Range Status   MRSA by PCR Next Gen NOT DETECTED NOT DETECTED Final    Comment: (NOTE) The GeneXpert MRSA Assay (FDA approved for NASAL specimens only), is one component of a comprehensive MRSA colonization surveillance program. It is not intended to diagnose MRSA infection nor to guide or monitor treatment for MRSA infections. Test performance is not FDA approved in patients less than 29 years old. Performed at [facility], [address], [place], [place] [address]   Peritoneal fluid culture w Gram Stain     Status: None   Collection Time: 02/14/22  2:50 PM   Specimen: Peritoneal Fluid  Result Value Ref Range Status   Specimen Description FLUID PERITONEAL  Final   Special Requests NONE  Final   Gram Stain   Final    RARE WBC PRESENT, PREDOMINANTLY MONONUCLEAR NO ORGANISMS SEEN    Culture   Final    NO GROWTH 3 DAYS Performed at [facility], [address], [place], [place] [address]    Report Status 02/18/2022 FINAL  Final         Radiology Studies: No results found.      Scheduled Meds:  carvedilol  3.125 mg Oral Daily   chlorhexidine  15 mL Mouth/Throat BID   feeding supplement  237 mL Oral TID BM   folic acid  1 mg Oral Daily   furosemide  40 mg Oral Daily   heparin injection (subcutaneous)  5,000 Units Subcutaneous Q8H   iron polysaccharides  150 mg Oral Daily   lactulose  20 g Oral BID   multivitamin with minerals  1 tablet Oral Daily    nicotine  21 mg Transdermal Daily   pantoprazole  40 mg Oral BID   potassium chloride  30 mEq Oral Q6H   rifaximin  550 mg Oral BID   sodium chloride flush  10-40 mL Intracatheter Q12H   spironolactone  50 mg Oral Daily   thiamine  100 mg Oral Daily   Continuous Infusions:  sodium chloride 10 mL/hr at 02/17/22 1100     LOS: 6 days       Huey Bienenstock, MD [facility]   To contact the attending provider between 7A-7P or the covering provider during after hours 7P-7A, please log into the web site [email or web address] and access using universal [place] password for that web site. If you do not have the password, please call the hospital operator.  02/19/2022, 3:29 PM

## 2022-02-20 DIAGNOSIS — E871 Hypo-osmolality and hyponatremia: Secondary | ICD-10-CM

## 2022-02-20 DIAGNOSIS — K7011 Alcoholic hepatitis with ascites: Secondary | ICD-10-CM

## 2022-02-20 LAB — PROTIME-INR
INR: 1.5 — ABNORMAL HIGH (ref 0.8–1.2)
Prothrombin Time: 18.5 seconds — ABNORMAL HIGH (ref 11.4–15.2)

## 2022-02-20 LAB — COMPREHENSIVE METABOLIC PANEL
ALT: 56 U/L — ABNORMAL HIGH (ref 0–44)
AST: 76 U/L — ABNORMAL HIGH (ref 15–41)
Albumin: 2.1 g/dL — ABNORMAL LOW (ref 3.5–5.0)
Alkaline Phosphatase: 96 U/L (ref 38–126)
Anion gap: 9 (ref 5–15)
BUN: 17 mg/dL (ref 6–20)
CO2: 24 mmol/L (ref 22–32)
Calcium: 8 mg/dL — ABNORMAL LOW (ref 8.9–10.3)
Chloride: 99 mmol/L (ref 98–111)
Creatinine, Ser: 0.92 mg/dL (ref 0.61–1.24)
GFR, Estimated: >60 mL/min (ref >60)
Glucose, Bld: 103 mg/dL — ABNORMAL HIGH (ref 70–99)
Potassium: 4.3 mmol/L (ref 3.5–5.1)
Sodium: 132 mmol/L — ABNORMAL LOW (ref 135–145)
Total Bilirubin: 5.3 mg/dL — ABNORMAL HIGH (ref 0.3–1.2)
Total Protein: 5 g/dL — ABNORMAL LOW (ref 6.5–8.1)

## 2022-02-20 LAB — PHOSPHORUS: Phosphorus: 2.2 mg/dL — ABNORMAL LOW (ref 2.5–4.6)

## 2022-02-20 LAB — CBC
HCT: 27.8 % — ABNORMAL LOW (ref 39.0–52.0)
Hemoglobin: 8.8 g/dL — ABNORMAL LOW (ref 13.0–17.0)
MCH: 28.2 pg (ref 26.0–34.0)
MCHC: 31.7 g/dL (ref 30.0–36.0)
MCV: 89.1 fL (ref 80.0–100.0)
Platelets: 75 10*3/uL — ABNORMAL LOW (ref 150–400)
RBC: 3.12 MIL/uL — ABNORMAL LOW (ref 4.22–5.81)
RDW: 17.9 % — ABNORMAL HIGH (ref 11.5–15.5)
WBC: 9.8 10*3/uL (ref 4.0–10.5)
nRBC: 0 % (ref 0.0–0.2)

## 2022-02-20 LAB — AMMONIA: Ammonia: 58 umol/L — ABNORMAL HIGH (ref 9–35)

## 2022-02-20 LAB — APTT: aPTT: 44 seconds — ABNORMAL HIGH (ref 24–36)

## 2022-02-20 MED ORDER — MIDODRINE HCL 5 MG PO TABS
5.0000 mg | ORAL_TABLET | Freq: Three times a day (TID) | ORAL | Status: DC
  Administered 2022-02-20 (×3): 5 mg via ORAL
  Filled 2022-02-20 (×3): qty 1

## 2022-02-20 NOTE — PMR Pre-admission (Shared)
PMR Admission Coordinator Pre-Admission Assessment  Patient: Harold Howard is an 41 y.o., male MRN: [id number] DOB: 10/12/1981 Height: 5\' 9"  (175.3 cm) Weight: 93.3 kg  Insurance Information HMO: ***    PPO: ***     PCP: ***     IPA: ***     80/20: ***     OTHER: *** PRIMARY: ***      Policy#: ***      Subscriber: *** CM Name: ***      Phone#: ***     Fax#: *** Pre-Cert#: ***      Employer: *** Benefits:  Phone #: ***     Name: *** Eff. Date: ***     Deduct: ***      Out of Pocket Max: ***      Life Max: *** CIR: ***      SNF: *** Outpatient: ***     Co-Pay: *** Home Health: ***      Co-Pay: *** DME: ***     Co-Pay: *** Providers: *** SECONDARY:       Policy#:      Phone#:   Financial Counselor:       Phone#:   The for patients in Inpatient Rehabilitation Facilities with attached Privacy Act Statement-Health Care Records was provided and verbally reviewed with: N/A  Emergency Contact Information Contact Information     Name Relation Home Work Mobile   Powell,Victoria Sister   [phone]   [phone] Significant other   [phone]       Current Medical History  Patient Admitting Diagnosis: debility, GIB History of Present Illness: Pt is a 41 y/o male with history of daily ETOH use who presents on 2/19 with 1 month history of vomiting blood and dark stools consistent with upper GIB.  Underwent EGD on 2/19 with banding of esophageal varices.  In ED pt hypotensive and developed worsening encephalopathy requiring intubation for airway protection.  HE received a total of 8 units pRBCS.  PCCM consulted for admit and GI consulted.  He underwent paracentesis on 2/20.  Self extubated 2/22 and off pressors.  Plan for paracentesis 2/27.  Gi recommend continue pantoprazole PO as tolerated, and monitor CBC closely.  Ammonia downtrending.  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  Recommendations for low salt diet.  Gi recommended  outpatient f/u in 4-6 weeks.  Therapy ongoing and patient was recommended for CIR.     Patient's medical record from 3/27 has been reviewed by the rehabilitation admission coordinator and physician.  Past Medical History  Past Medical History:  Diagnosis Date   ETOH abuse     Has the patient had major surgery during 100 days prior to admission? Yes  Family History   family history includes Alcoholism in his mother.  Current Medications  Current Facility-Administered Medications:    0.9 %  sodium chloride infusion, , Intravenous, PRN, Redge Gainer, MD, Last Rate: 10 mL/hr at 02/17/22 1100, Infusion Verify at 02/17/22 1100   albuterol (PROVENTIL) (2.5 MG/3ML) 0.083% nebulizer solution 2.5 mg, 2.5 mg, Nebulization, Q4H PRN, 02/19/22 B, NP   carvedilol (COREG) tablet 3.125 mg, 3.125 mg, Oral, Daily, Selmer Dominion, PA-C, 3.125 mg at 02/20/22 0933   chlorhexidine (PERIDEX) 0.12 % solution 15 mL, 15 mL, Mouth/Throat, BID, Elgergawy, 02/16/2022, MD, 15 mL at 02/19/22 2104   feeding supplement (ENSURE ENLIVE / ENSURE PLUS) liquid 237 mL, 237 mL, Oral, TID BM, Chand, Sudham, MD, 237 mL at  02/20/22 1504   folic acid (FOLVITE) tablet 1 mg, 1 mg, Oral, Daily, Verdene Lennert, MD, 1 mg at 02/20/22 0933   furosemide (LASIX) tablet 40 mg, 40 mg, Oral, Daily, Dianah Field, PA-C, 40 mg at 02/20/22 0933   heparin injection 5,000 Units, 5,000 Units, Subcutaneous, Q8H, Verdene Lennert, MD, 5,000 Units at 02/20/22 1504   iron polysaccharides (NIFEREX) capsule 150 mg, 150 mg, Oral, Daily, Chand, Sudham, MD, 150 mg at 02/20/22 0933   lactulose (CHRONULAC) 10 GM/15ML solution 20 g, 20 g, Oral, BID, Dianah Field, PA-C, 20 g at 02/20/22 0935   lip balm (CARMEX) ointment, , Topical, PRN, Elgergawy, Leana Roe, MD, 75 application at 02/19/22 0932   midodrine (PROAMATINE) tablet 5 mg, 5 mg, Oral, TID WC, Elgergawy, Leana Roe, MD, 5 mg at 02/20/22 1849   multivitamin with minerals tablet 1 tablet,  1 tablet, Oral, Daily, Verdene Lennert, MD, 1 tablet at 02/20/22 8938   nicotine (NICODERM CQ - dosed in mg/24 hours) patch 21 mg, 21 mg, Transdermal, Daily, Elgergawy, Leana Roe, MD, 21 mg at 02/20/22 0934   pantoprazole (PROTONIX) EC tablet 40 mg, 40 mg, Oral, BID, Dianah Field, PA-C, 40 mg at 02/20/22 0933   rifaximin (XIFAXAN) tablet 550 mg, 550 mg, Oral, BID, Dianah Field, PA-C, 550 mg at 02/20/22 1017   sodium chloride flush (NS) 0.9 % injection 10-40 mL, 10-40 mL, Intracatheter, Q12H, Chand, Sudham, MD, 10 mL at 02/20/22 5102   sodium chloride flush (NS) 0.9 % injection 10-40 mL, 10-40 mL, Intracatheter, PRN, Cheri Fowler, MD   spironolactone (ALDACTONE) tablet 50 mg, 50 mg, Oral, Daily, Dianah Field, PA-C, 50 mg at 02/20/22 5852   thiamine tablet 100 mg, 100 mg, Oral, Daily, Chand, Garnet Sierras, MD, 100 mg at 02/20/22 7782  Patients Current Diet:  Diet Order             Diet 2 gram sodium Room service appropriate? Yes; Fluid consistency: Thin; Fluid restriction: 2000 mL Fluid  Diet effective now                   Precautions / Restrictions Precautions Precautions: Fall Precaution Comments: seziure in ED, femoral central line, flexiseal, urinary catheter, Restrictions Weight Bearing Restrictions: No   Has the patient had 2 or more falls or a fall with injury in the past year? Yes  Prior Activity Level Community (5-7x/wk): independent prior to admit, driving, no DME used, working at [employer]  Prior Functional Level Self Care: Did the patient need help bathing, dressing, using the toilet or eating? Independent  Indoor Mobility: Did the patient need assistance with walking from room to room (with or without device)? Independent  Stairs: Did the patient need assistance with internal or external stairs (with or without device)? Independent  Functional Cognition: Did the patient need help planning regular tasks such as shopping or remembering to take medications?  Independent  Patient Information    Patient's Response To:     Home Assistive Devices / Equipment Home Equipment: None  Prior Device Use: Indicate devices/aids used by the patient prior to current illness, exacerbation or injury? None of the above  Current Functional Level Cognition  Overall Cognitive Status: Impaired/Different from baseline Current Attention Level: Focused Orientation Level: Oriented X4 Following Commands: Follows one step commands with increased time, Follows one step commands inconsistently, Follows multi-step commands inconsistently, Follows multi-step commands with increased time Safety/Judgement: Decreased awareness of deficits, Decreased awareness of safety General Comments: processing slightly improved  today, short term memory is very impaired cannot recall 3 words asked to remember,    Extremity Assessment (includes Sensation/Coordination)  Upper Extremity Assessment: Generalized weakness  Lower Extremity Assessment: Defer to PT evaluation RLE Deficits / Details: ROM WFL, strength assessed with movement to be 3+/5 RLE Coordination: decreased fine motor, decreased gross motor LLE Deficits / Details: ROM WFL, strength assessed with movement to be 3+/5 LLE Coordination: decreased fine motor, decreased gross motor    ADLs  Overall ADL's : Needs assistance/impaired Grooming: Maximal assistance, Sitting Upper Body Bathing: Maximal assistance, Sitting Lower Body Bathing: Maximal assistance, +2 for physical assistance, +2 for safety/equipment, Sit to/from stand Upper Body Dressing : Maximal assistance Lower Body Dressing: Maximal assistance, +2 for physical assistance, +2 for safety/equipment, Sit to/from stand Toilet Transfer: +2 for physical assistance, +2 for safety/equipment, Stand-pivot, Maximal assistance Toileting- Clothing Manipulation and Hygiene: Total assistance Toileting - Clothing Manipulation Details (indicate cue type and reason): flexiseal and  foley cath General ADL Comments: Noted deficits in cognition, standing balance and strength. Questionable vision impairments    Mobility  Overal bed mobility: Needs Assistance Bed Mobility: Supine to Sit Supine to sit: Min assist, HOB elevated General bed mobility comments: min A for pad scoot to EoB, increased time and difficulty problem solving how to offweight hips    Transfers  Overall transfer level: Needs assistance Equipment used: Rolling walker (2 wheels) Transfers: Sit to/from Stand, Bed to chair/wheelchair/BSC Sit to Stand: Mod assist, From elevated surface Bed to/from chair/wheelchair/BSC transfer type:: Step pivot Step pivot transfers: Max assist General transfer comment: good power up but requires modA for steadying in upright and keeping RW down on floor due to posterior lean, max A for stepping to recliner due to increased posterior lean    Ambulation / Gait / Stairs / Wheelchair Mobility  Ambulation/Gait Pre-gait activities: marching in place increased effort for lifting LE off floor    Posture / Balance Balance Overall balance assessment: Needs assistance Sitting-balance support: Feet supported, No upper extremity supported Sitting balance-Leahy Scale: Fair Standing balance support: During functional activity, Bilateral upper extremity supported Standing balance-Leahy Scale: Zero Standing balance comment: requires outside assit for balance    Special needs/care consideration N/a   Previous Home Environment (from acute therapy documentation) Living Arrangements: Non-relatives/Friends, Children (roommates, 12 and 709 yo children) Available Help at Discharge: Family, Available PRN/intermittently Type of Home: Mobile home Home Layout: One level Home Access: Stairs to enter Entrance Stairs-Rails: Right, Left Entrance Stairs-Number of Steps: 3 + 1 Bathroom Shower/Tub: Tub only, Psychologist, counsellingWalk-in shower, Engineer, manufacturing systemsTub/shower unit Bathroom Toilet: Standard Additional Comments:  significant other does not live with pt d/t his hx of etoh abuse  Discharge Living Setting Plans for Discharge Living Setting: Patient's home, Lives with (comment) (roommates) Type of Home at Discharge: Mobile home Discharge Home Layout: One level Discharge Home Access: Stairs to enter Entrance Stairs-Rails: Right, Left Entrance Stairs-Number of Steps: 4+1 Discharge Bathroom Shower/Tub: Tub/shower unit, Walk-in shower Discharge Bathroom Toilet: Standard Discharge Bathroom Accessibility: Yes How Accessible: Accessible via walker Does the patient have any problems obtaining your medications?: No  Social/Family/Support Systems    Goals    Decrease burden of Care through IP rehab admission: n/a  Possible need for SNF placement upon discharge: not anticipated.  Pt with support from significant other and friends  Patient Condition: [facility], spoken with CSW, and family member. I discussed via phone for inpatient rehabilitation assessment.  Patient will benefit from ongoing PT, OT,  and SLP, can actively participate in 3 hours of therapy a day 5 days of the week, and can make measurable gains during the admission.  Patient will also benefit from the coordinated team approach during an Inpatient Acute Rehabilitation admission.  The patient will receive intensive therapy as well as Rehabilitation physician, nursing, social worker, and care management interventions.  Due to bladder management, bowel management, safety, skin/wound care, disease management, medication administration, pain management, and patient education the patient requires 24 hour a day rehabilitation nursing.  The patient is currently *** with mobility and basic ADLs.  Discharge setting and therapy post discharge at home with home health is anticipated.  Patient has agreed to participate in the Acute Inpatient Rehabilitation Program and will admit {Time; today/tomorrow:10263}.  Preadmission  Screen Completed By:  Estill Dooms, PT, DPT with updates by Stephania Fragmin, 02/20/2022 8:51 PM ______________________________________________________________________   Discussed status with Dr. Marland Kitchen on *** at *** and received approval for admission today.  Admission Coordinator:  Stephania Fragmin, PT, time Marland KitchenDorna Bloom ***   Assessment/Plan: Diagnosis: Does the need for close, 24 hr/day Medical supervision in concert with the patient's rehab needs make it unreasonable for this patient to be served in a less intensive setting? {yes_no_potentially:3041433} Co-Morbidities requiring supervision/potential complications: *** Due to {due MC:9470962}, does the patient require 24 hr/day rehab nursing? {yes_no_potentially:3041433} Does the patient require coordinated care of a physician, rehab nurse, PT, OT, and SLP to address physical and functional deficits in the context of the above medical diagnosis(es)? {yes_no_potentially:3041433} Addressing deficits in the following areas: {deficits:3041436} Can the patient actively participate in an intensive therapy program of at least 3 hrs of therapy 5 days a week? {yes_no_potentially:3041433} The potential for patient to make measurable gains while on inpatient rehab is {potential:3041437} Anticipated functional outcomes upon discharge from inpatient rehab: {functional outcomes:304600100} PT, {functional outcomes:304600100} OT, {functional outcomes:304600100} SLP Estimated rehab length of stay to reach the above functional goals is: *** Anticipated discharge destination: {anticipated dc setting:21604} 10. Overall Rehab/Functional Prognosis: {potential:3041437}   MD Signature: ***

## 2022-02-20 NOTE — Progress Notes (Signed)
PROGRESS NOTE    Harold Howard  IDC:[id number] DOB: 1981/01/07 DOA: 02/24/2022 PCP: Patient, No Pcp Per (Inactive)     Chief Complaint  Patient presents with   Near Syncope   Emesis   Diarrhea    Brief Narrative:    Harold Howard is a 41 y/o gentleman with a history of daily ETOH use who presents with 1 month of vomiting blood and dark stools consistent with upper GI bleed s/p EGD on 2/19 with banding of esophageal varices.    History is provided by the ED and his step sister as he is intubated and sedated. His family is estranged due to his ETOH consumption. His step-sister indicated she is the only family he is in contact with. He drinks about a fifth per day, but has been working on cutting back recently, but he still drinks about a half gallon of liquor every 2-3 days. He came home from work early yesterday after texting his roommate a picture of a toilet bowl full of BRB. He was found on the floor this morning screaming to call 911. No emesis with EMS. Received 500cc crystalloid and zofran. He was hypotensive and developed worsening encephalopathy in the ED and required intubation. He had a seizure in the ED.  He received 8 pRBCs. PCCM consulted for admission. GI consulted by the ED.   No known use of NSAIDs or anticoagulants. His step-sister is not clear on his medical history. 2/19 admission, intubated sedated, EGD  2/20 paracentesis  2/22 patient self-extubated, off pressors     Assessment & Plan:   Principal Problem:   Upper GI bleed Active Problems:   ETOH abuse   Hypovolemic shock (HCC)   Bleeding esophageal varices (HCC)   Malnutrition of moderate degree   Hemorrhagic shock Acute on subacute upper GIB, Esophageal Varices  Acute blood loss anemia  -Hemorrhagic  shock resolved, off pressors, but blood pressure remains soft - Continue pantoprazole IV BID switch to PO as tolerated  - Continue Ceftriaxone for SBP prophylaxis for 5 days initiated on 2/19  -  Recommend ETOH cessation; can counsel when appropriate -Tolerating regular diet - Iron supplementation   - Monitor electrolytes and supplement  -Monitor CBC closely,  slowly drifting down, but so far no indication for transfusion. -Follow-up with GI in 4 to 6 weeks with upper endoscopy in 6 to 8 weeks with further EVL if needed   Decompensated alcoholic cirrhosis  Ammonia downtrending to 44. Hyperbilirubinemia and elevated transaminase due to hypoperfusion,  ETOH abuse.  - 2/19 RUQ [place]: cirrhotic hepatic morphology, abdominopelvic ascites, no gallstones or biliary dilation, bidirectional flow is suspected in the main portal vein with varicosities at the hepatic hilum. MELD score 24. Paracentesis on 2/20 not consistent with SBP. SAAG is 1 g/dl. Liver work up negative thus far and etiology of cirrhosis is likely secondary to alcohol use. Peritoneal fluid culture showed no growth.  -Patient with multiple bowel movements and diarrhea, on Flexi-Seal, but I will hold on decreasing lactulose given ammonia level remains elevated and slightly trending up.   - Outpatient GI follow up  - ETOH cessation   - ANA pending    -Low-salt diet, normal protein diet -Started on rifaximin -  on Lasix and Aldactone. -Avoid NSAIDs -Started on Coreg -Patient with poor appetite, ascites has been drained is building back up, will request paracentesis tomorrow hopefully this will help with appetite as well.  Hyponatremia -He is having increased fluid intake, will limit his fluids to 2000  cc/day  Acute respiratory failure with hypoxia - Continue [place] with SpO2 > 88% , wean as tolerated    Hypernatremia  -Resolved, DC D5 W   Hypokalemia K of 3.2 - Replete with goal K > 4    Thrombocytopenia  Iron deficiency  Ferritin came back as 22.  - Iron supplementation    ETOH abuse - recommend cessation - vitamins when able to take PO - monitor for withdrawal - Phenobarbital taper ending on 2/25    AKI,  resolved - maintain adequate perfusion - strict I/Os - renally dose meds, avoid nephrotoxic meds   Prolonged Qtc  5/19 Qtc 511. Not currently on QT prolonging medications.  - Avoid high risk QT prolonging medications - Continue to monitor    Seizure, likely due to profound hypoperfusion and hypoxia Head CT on 2/19 showed no acute intracranial pathology.  - if recurrent, add keppra - ativan PRN - seizure precautions   DVT prophylaxis: SCD  Code Status: Full Family Communication: None at bedside Disposition:   Status is: Inpatient Remains inpatient appropriate because: Acutely ill with liver failure.    Procedures:  - 2/19 EGD: EGD yesterday with large esophageal varices, suspected gastric varices, and portal hypertensive gastropathy    Antibiotics:  - 2/19 Ceftriaxone for SBP prophylaxis          Consultants:  PCCM GI    Subjective:  Patient reports he is feeling that the diarrhea is slowing down. Objective: Vitals:   02/20/22 0306 02/20/22 0500 02/20/22 0752 02/20/22 1200  BP: (!) 90/55  97/60 (!) 92/58  Pulse: 76  75 73  Resp: 16  20 16   Temp: 98.2 F (36.8 C)  98.2 F (36.8 C) 98.2 F (36.8 C)  TempSrc: Oral  Oral Oral  SpO2: 98%  96% 95%  Weight:  93.3 kg    Height:        Intake/Output Summary (Last 24 hours) at 02/20/2022 1421 Last data filed at 02/20/2022 1239 Gross per 24 hour  Intake --  Output 700 ml  Net -700 ml   Filed Weights   02/18/22 0500 02/19/22 0500 02/20/22 0500  Weight: 91.2 kg 91.3 kg 93.3 kg    Examination:  Awake Alert, Oriented X 3, ill-appearing Symmetrical Chest wall movement, Good air movement bilaterally, CTAB RRR,No Gallops,Rubs or new Murmurs, No Parasternal Heave +ve B.Sounds, Abd Soft, ascites present  no Cyanosis, Clubbing has scrotal edema, and +1 edema lower extremities bilaterally.    Data Reviewed: I have personally reviewed following labs and imaging studies  CBC: Recent Labs  Lab  02/15/22 0332 02/15/22 1559 02/16/22 0234 02/16/22 0318 02/17/22 0313 02/18/22 0703 02/19/22 0056 02/20/22 0635  WBC 11.0* 10.8*  --  9.6 11.3* 8.8 9.8 9.8  NEUTROABS 8.9* 8.6*  --  7.5 8.4* 5.6  --   --   HGB 8.7* 8.7*   < > 8.9* 9.1* 9.3* 8.4* 8.8*  HCT 26.3* 26.4*   < > 27.4* 28.4* 29.5* 26.8* 27.8*  MCV 87.7 88.6  --  89.8 91.3 91.9 90.5 89.1  PLT 66* 77*  --  80* 75* 64* 56* 75*   < > = values in this interval not displayed.    Basic Metabolic Panel: Recent Labs  Lab 02/02/2022 1653 01/28/2022 1703 02/15/22 [id number] 02/16/22 0234 02/16/22 [id number] 02/17/22 0313 02/18/22 0703 02/19/22 0056 02/20/22 0635  NA  --    < > 144   < > 144 149* 141 136 132*  K  --    < >  3.7   < > 3.8 3.2* 3.1* 3.4* 4.3  CL  --    < > 109  --  113* 115* 110 104 99  CO2  --    < > 25  --  25 26 24 25 24   GLUCOSE  --    < > 140*  --  153* 125* 124* 113* 103*  BUN  --    < > 41*  --  41* 31* 17 14 17   CREATININE  --    < > 1.72*  --  1.45* 1.23 1.03 0.88 0.92  CALCIUM  --    < > 8.2*  --  8.3* 8.5* 8.2* 7.8* 8.0*  MG 1.6*  --  2.3  --  2.3  --   --   --   --   PHOS 4.8*  --  4.6  --  3.5  --   --   --  2.2*   < > = values in this interval not displayed.    GFR: Estimated Creatinine Clearance: 120.3 mL/min (by C-G formula based on SCr of 0.92 mg/dL).  Liver Function Tests: Recent Labs  Lab 02/15/22 0332 02/16/22 0318 02/17/22 0313 02/19/22 0056 02/20/22 0635  AST 593* 310* 160* 83* 76*  ALT 149* 125* 103* 70* 56*  ALKPHOS 87 81 83 95 96  BILITOT 6.4* 6.1* 6.9* 4.5* 5.3*  PROT 5.5* 5.4* 5.2* 4.7* 5.0*  ALBUMIN 2.5* 2.4* 2.4* 2.0* 2.1*    CBG: Recent Labs  Lab 02/17/22 1203 02/17/22 1626 02/17/22 1938 02/18/22 0035 02/18/22 0449  GLUCAP 143* 132* 112* 98 104*     Recent Results (from the past 240 hour(s))  Resp Panel by RT-PCR (Flu A&B, Covid) Nasopharyngeal Swab     Status: None   Collection Time: 02/05/2022 11:36 AM   Specimen: Nasopharyngeal Swab; Nasopharyngeal(NP) swabs in  vial transport medium  Result Value Ref Range Status   SARS Coronavirus 2 by RT PCR NEGATIVE NEGATIVE Final    Comment: (NOTE) SARS-CoV-2 target nucleic acids are NOT DETECTED.  The SARS-CoV-2 RNA is generally detectable in upper respiratory specimens during the acute phase of infection. The lowest concentration of SARS-CoV-2 viral copies this assay can detect is 138 copies/mL. A negative result does not preclude SARS-Cov-2 infection and should not be used as the sole basis for treatment or other patient management decisions. A negative result may occur with  improper specimen collection/handling, submission of specimen other than nasopharyngeal swab, presence of viral mutation(s) within the areas targeted by this assay, and inadequate number of viral copies(<138 copies/mL). A negative result must be combined with clinical observations, patient history, and epidemiological information. The expected result is Negative.  Fact Sheet for Patients:  EntrepreneurPulse.com.au  Fact Sheet for Healthcare Providers:  IncredibleEmployment.be  This test is no t yet approved or cleared by the [place] FDA and  has been authorized for detection and/or diagnosis of SARS-CoV-2 by FDA under an Emergency Use Authorization (EUA). This EUA will remain  in effect (meaning this test can be used) for the duration of the COVID-19 declaration under Section 564(b)(1) of the Act, 21 U.S.C.section 360bbb-3(b)(1), unless the authorization is terminated  or revoked sooner.       Influenza A by PCR NEGATIVE NEGATIVE Final   Influenza B by PCR NEGATIVE NEGATIVE Final    Comment: (NOTE) The Xpert Xpress SARS-CoV-2/FLU/RSV plus assay is intended as an aid in the diagnosis of influenza from Nasopharyngeal swab specimens and should not be used as  a sole basis for treatment. Nasal washings and aspirates are unacceptable for Xpert Xpress SARS-CoV-2/FLU/RSV testing.  Fact  Sheet for Patients: EntrepreneurPulse.com.au  Fact Sheet for Healthcare Providers: IncredibleEmployment.be  This test is not yet approved or cleared by the [place] FDA and has been authorized for detection and/or diagnosis of SARS-CoV-2 by FDA under an Emergency Use Authorization (EUA). This EUA will remain in effect (meaning this test can be used) for the duration of the COVID-19 declaration under Section 564(b)(1) of the Act, 21 U.S.C. section 360bbb-3(b)(1), unless the authorization is terminated or revoked.  Performed at [facility], [place] [address] [place], [place] [address]   MRSA Next Gen by PCR, Nasal     Status: None   Collection Time: 01/27/2022  4:53 PM   Specimen: Nasal Mucosa; Nasal Swab  Result Value Ref Range Status   MRSA by PCR Next Gen NOT DETECTED NOT DETECTED Final    Comment: (NOTE) The GeneXpert MRSA Assay (FDA approved for NASAL specimens only), is one component of a comprehensive MRSA colonization surveillance program. It is not intended to diagnose MRSA infection nor to guide or monitor treatment for MRSA infections. Test performance is not FDA approved in patients less than 43 years old. Performed at [facility], [place] [address] [place], [place] [address]   Peritoneal fluid culture w Gram Stain     Status: None   Collection Time: 02/14/22  2:50 PM   Specimen: Peritoneal Fluid  Result Value Ref Range Status   Specimen Description FLUID PERITONEAL  Final   Special Requests NONE  Final   Gram Stain   Final    RARE WBC PRESENT, PREDOMINANTLY MONONUCLEAR NO ORGANISMS SEEN    Culture   Final    NO GROWTH 3 DAYS Performed at [facility], [place] [address], [place], [place] [address]    Report Status 02/18/2022 FINAL  Final         Radiology Studies: No results found.      Scheduled Meds:  carvedilol  3.125 mg Oral Daily   chlorhexidine  15 mL Mouth/Throat BID   feeding supplement   237 mL Oral TID BM   folic acid  1 mg Oral Daily   furosemide  40 mg Oral Daily   heparin injection (subcutaneous)  5,000 Units Subcutaneous Q8H   iron polysaccharides  150 mg Oral Daily   lactulose  20 g Oral BID   midodrine  5 mg Oral TID WC   multivitamin with minerals  1 tablet Oral Daily   nicotine  21 mg Transdermal Daily   pantoprazole  40 mg Oral BID   rifaximin  550 mg Oral BID   sodium chloride flush  10-40 mL Intracatheter Q12H   spironolactone  50 mg Oral Daily   thiamine  100 mg Oral Daily   Continuous Infusions:  sodium chloride 10 mL/hr at 02/17/22 1100     LOS: 7 days       Phillips Climes, MD [facility]   To contact the attending provider between 7A-7P or the covering provider during after hours 7P-7A, please log into the web site [email or web address] and access using universal [place] password for that web site. If you do not have the password, please call the hospital operator.  02/20/2022, 2:21 PM

## 2022-02-20 NOTE — Plan of Care (Signed)
°  Problem: Respiratory: Goal: Ability to maintain a clear airway and adequate ventilation will improve Outcome: Progressing   Problem: Role Relationship: Goal: Method of communication will improve Outcome: Progressing   Problem: Fluid Volume: Goal: Will show no signs and symptoms of excessive bleeding Outcome: Progressing   Problem: Clinical Measurements: Goal: Complications related to the disease process, condition or treatment will be avoided or minimized Outcome: Progressing   Problem: Education: Goal: Knowledge of General Education information will improve Description: Including pain rating scale, medication(s)/side effects and non-pharmacologic comfort measures Outcome: Progressing   Problem: Health Behavior/Discharge Planning: Goal: Ability to manage health-related needs will improve Outcome: Progressing   Problem: Clinical Measurements: Goal: Will remain free from infection Outcome: Progressing Goal: Respiratory complications will improve Outcome: Progressing Goal: Cardiovascular complication will be avoided Outcome: Progressing   Problem: Coping: Goal: Level of anxiety will decrease Outcome: Progressing   Problem: Elimination: Goal: Will not experience complications related to bowel motility Outcome: Progressing Goal: Will not experience complications related to urinary retention Outcome: Progressing   Problem: Pain Managment: Goal: General experience of comfort will improve Outcome: Progressing   Problem: Safety: Goal: Ability to remain free from injury will improve Outcome: Progressing   Problem: Skin Integrity: Goal: Risk for impaired skin integrity will decrease Outcome: Progressing   Problem: Activity: Goal: Ability to tolerate increased activity will improve Outcome: Not Progressing   Problem: Education: Goal: Ability to identify signs and symptoms of gastrointestinal bleeding will improve Outcome: Not Progressing   Problem:  Bowel/Gastric: Goal: Will show no signs and symptoms of gastrointestinal bleeding Outcome: Not Progressing   Problem: Clinical Measurements: Goal: Ability to maintain clinical measurements within normal limits will improve Outcome: Not Progressing Goal: Diagnostic test results will improve Outcome: Not Progressing   Problem: Activity: Goal: Risk for activity intolerance will decrease Outcome: Not Progressing   Problem: Nutrition: Goal: Adequate nutrition will be maintained Outcome: Not Progressing

## 2022-02-20 NOTE — Progress Notes (Signed)
Telemetry notified RN of 'tachy arrhythmia' noted at 1341.  MD notified

## 2022-02-20 NOTE — Plan of Care (Signed)
°  Problem: Activity: Goal: Ability to tolerate increased activity will improve Outcome: Not Progressing   Problem: Respiratory: Goal: Ability to maintain a clear airway and adequate ventilation will improve Outcome: Progressing   Problem: Role Relationship: Goal: Method of communication will improve Outcome: Progressing

## 2022-02-21 ENCOUNTER — Inpatient Hospital Stay (HOSPITAL_COMMUNITY): Payer: Self-pay

## 2022-02-21 ENCOUNTER — Encounter (HOSPITAL_COMMUNITY): Admission: EM | Disposition: E | Payer: Self-pay | Source: Home / Self Care | Attending: Internal Medicine

## 2022-02-21 ENCOUNTER — Encounter (HOSPITAL_COMMUNITY): Payer: Self-pay | Admitting: Critical Care Medicine

## 2022-02-21 ENCOUNTER — Inpatient Hospital Stay (HOSPITAL_COMMUNITY): Payer: Self-pay | Admitting: Anesthesiology

## 2022-02-21 ENCOUNTER — Encounter (HOSPITAL_COMMUNITY): Payer: Self-pay | Admitting: Anesthesiology

## 2022-02-21 DIAGNOSIS — K766 Portal hypertension: Secondary | ICD-10-CM

## 2022-02-21 DIAGNOSIS — I8511 Secondary esophageal varices with bleeding: Secondary | ICD-10-CM

## 2022-02-21 DIAGNOSIS — K2289 Other specified disease of esophagus: Secondary | ICD-10-CM

## 2022-02-21 DIAGNOSIS — E440 Moderate protein-calorie malnutrition: Secondary | ICD-10-CM

## 2022-02-21 DIAGNOSIS — K7460 Unspecified cirrhosis of liver: Secondary | ICD-10-CM

## 2022-02-21 DIAGNOSIS — K7290 Hepatic failure, unspecified without coma: Secondary | ICD-10-CM

## 2022-02-21 DIAGNOSIS — K3189 Other diseases of stomach and duodenum: Secondary | ICD-10-CM

## 2022-02-21 HISTORY — PX: RADIOLOGY WITH ANESTHESIA: SHX6223

## 2022-02-21 HISTORY — PX: IR EMBO VENOUS NOT HEMORR HEMANG  INC GUIDE ROADMAPPING: IMG5447

## 2022-02-21 HISTORY — PX: IR TIPS: IMG2295

## 2022-02-21 HISTORY — PX: IR IVUS EACH ADDITIONAL NON CORONARY VESSEL: IMG6086

## 2022-02-21 HISTORY — PX: ESOPHAGOGASTRODUODENOSCOPY (EGD) WITH PROPOFOL: SHX5813

## 2022-02-21 HISTORY — PX: IR PARACENTESIS: IMG2679

## 2022-02-21 LAB — MAGNESIUM: Magnesium: 1.3 mg/dL — ABNORMAL LOW (ref 1.7–2.4)

## 2022-02-21 LAB — POCT I-STAT 7, (LYTES, BLD GAS, ICA,H+H)
Acid-Base Excess: 2 mmol/L (ref 0.0–2.0)
Acid-Base Excess: 0 mmol/L (ref 0.0–2.0)
Bicarbonate: 26.5 mmol/L (ref 20.0–28.0)
Bicarbonate: 25.3 mmol/L (ref 20.0–28.0)
Calcium, Ion: 1.08 mmol/L — ABNORMAL LOW (ref 1.15–1.40)
Calcium, Ion: 1.08 mmol/L — ABNORMAL LOW (ref 1.15–1.40)
HCT: 30 % — ABNORMAL LOW (ref 39.0–52.0)
HCT: 25 % — ABNORMAL LOW (ref 39.0–52.0)
Hemoglobin: 10.2 g/dL — ABNORMAL LOW (ref 13.0–17.0)
Hemoglobin: 8.5 g/dL — ABNORMAL LOW (ref 13.0–17.0)
O2 Saturation: 100 %
O2 Saturation: 97 %
Patient temperature: 98.9
Patient temperature: 97.5
Potassium: 4.8 mmol/L (ref 3.5–5.1)
Potassium: 3.9 mmol/L (ref 3.5–5.1)
Sodium: 130 mmol/L — ABNORMAL LOW (ref 135–145)
Sodium: 137 mmol/L (ref 135–145)
TCO2: 28 mmol/L (ref 22–32)
TCO2: 27 mmol/L (ref 22–32)
pCO2 arterial: 40.9 mmHg (ref 32–48)
pCO2 arterial: 40.4 mmHg (ref 32–48)
pH, Arterial: 7.421 (ref 7.35–7.45)
pH, Arterial: 7.402 (ref 7.35–7.45)
pO2, Arterial: 190 mmHg — ABNORMAL HIGH (ref 83–108)
pO2, Arterial: 93 mmHg (ref 83–108)

## 2022-02-21 LAB — BASIC METABOLIC PANEL
Anion gap: 8 (ref 5–15)
BUN: 17 mg/dL (ref 6–20)
CO2: 23 mmol/L (ref 22–32)
Calcium: 7.4 mg/dL — ABNORMAL LOW (ref 8.9–10.3)
Chloride: 105 mmol/L (ref 98–111)
Creatinine, Ser: 0.86 mg/dL (ref 0.61–1.24)
GFR, Estimated: >60 mL/min (ref >60)
Glucose, Bld: 154 mg/dL — ABNORMAL HIGH (ref 70–99)
Potassium: 3.8 mmol/L (ref 3.5–5.1)
Sodium: 136 mmol/L (ref 135–145)

## 2022-02-21 LAB — COMPREHENSIVE METABOLIC PANEL
ALT: 55 U/L — ABNORMAL HIGH (ref 0–44)
AST: 93 U/L — ABNORMAL HIGH (ref 15–41)
Albumin: 2.2 g/dL — ABNORMAL LOW (ref 3.5–5.0)
Alkaline Phosphatase: 106 U/L (ref 38–126)
Anion gap: 8 (ref 5–15)
BUN: 18 mg/dL (ref 6–20)
CO2: 25 mmol/L (ref 22–32)
Calcium: 8.1 mg/dL — ABNORMAL LOW (ref 8.9–10.3)
Chloride: 97 mmol/L — ABNORMAL LOW (ref 98–111)
Creatinine, Ser: 0.9 mg/dL (ref 0.61–1.24)
GFR, Estimated: >60 mL/min (ref >60)
Glucose, Bld: 97 mg/dL (ref 70–99)
Potassium: 5.5 mmol/L — ABNORMAL HIGH (ref 3.5–5.1)
Sodium: 130 mmol/L — ABNORMAL LOW (ref 135–145)
Total Bilirubin: 6 mg/dL — ABNORMAL HIGH (ref 0.3–1.2)
Total Protein: 5.6 g/dL — ABNORMAL LOW (ref 6.5–8.1)

## 2022-02-21 LAB — PROTIME-INR
INR: 1.4 — ABNORMAL HIGH (ref 0.8–1.2)
INR: 1.5 — ABNORMAL HIGH (ref 0.8–1.2)
Prothrombin Time: 17.3 seconds — ABNORMAL HIGH (ref 11.4–15.2)
Prothrombin Time: 18.5 seconds — ABNORMAL HIGH (ref 11.4–15.2)

## 2022-02-21 LAB — CBC
HCT: 30.8 % — ABNORMAL LOW (ref 39.0–52.0)
HCT: 28.9 % — ABNORMAL LOW (ref 39.0–52.0)
HCT: 27.8 % — ABNORMAL LOW (ref 39.0–52.0)
Hemoglobin: 9.7 g/dL — ABNORMAL LOW (ref 13.0–17.0)
Hemoglobin: 9.9 g/dL — ABNORMAL LOW (ref 13.0–17.0)
Hemoglobin: 9.8 g/dL — ABNORMAL LOW (ref 13.0–17.0)
MCH: 28.2 pg (ref 26.0–34.0)
MCH: 29.8 pg (ref 26.0–34.0)
MCH: 29.9 pg (ref 26.0–34.0)
MCHC: 31.5 g/dL (ref 30.0–36.0)
MCHC: 34.3 g/dL (ref 30.0–36.0)
MCHC: 35.3 g/dL (ref 30.0–36.0)
MCV: 89.5 fL (ref 80.0–100.0)
MCV: 87 fL (ref 80.0–100.0)
MCV: 84.8 fL (ref 80.0–100.0)
Platelets: 90 10*3/uL — ABNORMAL LOW (ref 150–400)
Platelets: 133 10*3/uL — ABNORMAL LOW (ref 150–400)
Platelets: 31 10*3/uL — ABNORMAL LOW (ref 150–400)
RBC: 3.44 MIL/uL — ABNORMAL LOW (ref 4.22–5.81)
RBC: 3.32 MIL/uL — ABNORMAL LOW (ref 4.22–5.81)
RBC: 3.28 MIL/uL — ABNORMAL LOW (ref 4.22–5.81)
RDW: 18.3 % — ABNORMAL HIGH (ref 11.5–15.5)
RDW: 16.3 % — ABNORMAL HIGH (ref 11.5–15.5)
RDW: 14.6 % (ref 11.5–15.5)
WBC: 12.2 10*3/uL — ABNORMAL HIGH (ref 4.0–10.5)
WBC: 21.6 10*3/uL — ABNORMAL HIGH (ref 4.0–10.5)
WBC: 10.4 10*3/uL (ref 4.0–10.5)
nRBC: 0 % (ref 0.0–0.2)
nRBC: 0 % (ref 0.0–0.2)
nRBC: 0 % (ref 0.0–0.2)

## 2022-02-21 LAB — PREPARE RBC (CROSSMATCH)

## 2022-02-21 LAB — PHOSPHORUS
Phosphorus: 2.7 mg/dL (ref 2.5–4.6)
Phosphorus: 5.3 mg/dL — ABNORMAL HIGH (ref 2.5–4.6)

## 2022-02-21 LAB — GLUCOSE, CAPILLARY
Glucose-Capillary: 126 mg/dL — ABNORMAL HIGH (ref 70–99)
Glucose-Capillary: 140 mg/dL — ABNORMAL HIGH (ref 70–99)
Glucose-Capillary: 143 mg/dL — ABNORMAL HIGH (ref 70–99)
Glucose-Capillary: 145 mg/dL — ABNORMAL HIGH (ref 70–99)
Glucose-Capillary: 89 mg/dL (ref 70–99)

## 2022-02-21 LAB — MASSIVE TRANSFUSION PROTOCOL ORDER (BLOOD BANK NOTIFICATION)

## 2022-02-21 LAB — BLOOD PRODUCT ORDER (VERBAL) VERIFICATION

## 2022-02-21 LAB — APTT: aPTT: 40 seconds — ABNORMAL HIGH (ref 24–36)

## 2022-02-21 SURGERY — ESOPHAGOGASTRODUODENOSCOPY (EGD) WITH PROPOFOL
Anesthesia: Moderate Sedation

## 2022-02-21 SURGERY — IR WITH ANESTHESIA
Anesthesia: General

## 2022-02-21 MED ORDER — MIDAZOLAM HCL (PF) 5 MG/ML IJ SOLN
INTRAMUSCULAR | Status: AC
  Filled 2022-02-21: qty 1

## 2022-02-21 MED ORDER — SODIUM CHLORIDE 0.9% IV SOLUTION: Freq: Once | INTRAVENOUS | Status: DC

## 2022-02-21 MED ORDER — SODIUM CHLORIDE 0.9 % IV SOLN
2.0000 g | INTRAVENOUS | Status: AC
  Administered 2022-02-21 – 2022-02-25 (×5): 2 g via INTRAVENOUS
  Filled 2022-02-21 (×5): qty 20

## 2022-02-21 MED ORDER — MAGNESIUM SULFATE 2 GM/50ML IV SOLN
2.0000 g | Freq: Once | INTRAVENOUS | Status: AC
  Administered 2022-02-22: 2 g via INTRAVENOUS
  Filled 2022-02-21: qty 50

## 2022-02-21 MED ORDER — MIDAZOLAM HCL 2 MG/2ML IJ SOLN: 4.0000 mg | Freq: Once | INTRAMUSCULAR | Status: DC

## 2022-02-21 MED ORDER — DOCUSATE SODIUM 50 MG/5ML PO LIQD: 100.0000 mg | Freq: Two times a day (BID) | ORAL | Status: DC

## 2022-02-21 MED ORDER — MIDAZOLAM BOLUS VIA INFUSION
0.0000 mg | INTRAVENOUS | Status: DC | PRN
  Administered 2022-02-24: 1 mg via INTRAVENOUS
  Filled 2022-02-21: qty 5

## 2022-02-21 MED ORDER — VASOPRESSIN 20 UNITS/100 ML INFUSION FOR SHOCK
0.0000 [IU]/min | INTRAVENOUS | Status: DC
  Administered 2022-02-21 – 2022-02-23 (×6): 0.03 [IU]/min via INTRAVENOUS
  Administered 2022-02-24: 0.02 [IU]/min via INTRAVENOUS
  Filled 2022-02-21 (×7): qty 100

## 2022-02-21 MED ORDER — SODIUM CHLORIDE 0.9% IV SOLUTION: Freq: Once | INTRAVENOUS | Status: AC

## 2022-02-21 MED ORDER — SODIUM BICARBONATE 8.4 % IV SOLN
INTRAVENOUS | Status: DC | PRN
  Administered 2022-02-21: 50 meq via INTRAVENOUS

## 2022-02-21 MED ORDER — PHENYLEPHRINE 40 MCG/ML (10ML) SYRINGE FOR IV PUSH (FOR BLOOD PRESSURE SUPPORT)
PREFILLED_SYRINGE | INTRAVENOUS | Status: DC | PRN
  Administered 2022-02-21: 120 ug via INTRAVENOUS

## 2022-02-21 MED ORDER — DEXMEDETOMIDINE HCL IN NACL 400 MCG/100ML IV SOLN
0.4000 ug/kg/h | INTRAVENOUS | Status: DC
  Administered 2022-02-21: .4 ug/kg/h via INTRAVENOUS

## 2022-02-21 MED ORDER — VASOPRESSIN 20 UNIT/ML IV SOLN
INTRAVENOUS | Status: DC | PRN
  Administered 2022-02-21: 2 [IU] via INTRAVENOUS
  Administered 2022-02-21: 3 [IU] via INTRAVENOUS

## 2022-02-21 MED ORDER — IOHEXOL 300 MG/ML  SOLN
100.0000 mL | Freq: Once | INTRAMUSCULAR | Status: AC | PRN
  Administered 2022-02-21: 50 mL via INTRAVENOUS

## 2022-02-21 MED ORDER — EPINEPHRINE HCL 5 MG/250ML IV SOLN IN NS
INTRAVENOUS | Status: AC
Start: 2022-02-21 — End: 2022-02-22
  Filled 2022-02-21: qty 250

## 2022-02-21 MED ORDER — PANTOPRAZOLE INFUSION (NEW) - SIMPLE MED
8.0000 mg/h | INTRAVENOUS | Status: DC
Start: 2022-02-21 — End: 2022-02-23
  Administered 2022-02-21 – 2022-02-23 (×5): 8 mg/h via INTRAVENOUS
  Filled 2022-02-21 (×5): qty 80
  Filled 2022-02-21: qty 100

## 2022-02-21 MED ORDER — MIDAZOLAM-SODIUM CHLORIDE 100-0.9 MG/100ML-% IV SOLN
0.0000 mg/h | INTRAVENOUS | Status: DC
  Administered 2022-02-21: 10 mg/h via INTRAVENOUS
  Administered 2022-02-21: 5 mg/h via INTRAVENOUS
  Administered 2022-02-22 (×2): 10 mg/h via INTRAVENOUS
  Administered 2022-02-23: 2 mg/h via INTRAVENOUS
  Administered 2022-02-23: 10 mg/h via INTRAVENOUS
  Filled 2022-02-21 (×7): qty 100

## 2022-02-21 MED ORDER — ROCURONIUM BROMIDE 10 MG/ML (PF) SYRINGE
PREFILLED_SYRINGE | INTRAVENOUS | Status: DC | PRN
  Administered 2022-02-21 (×5): 50 mg via INTRAVENOUS

## 2022-02-21 MED ORDER — CALCIUM CHLORIDE 10 % IV SOLN
INTRAVENOUS | Status: DC | PRN
  Administered 2022-02-21: 300 mg via INTRAVENOUS
  Administered 2022-02-21: 1000 mg via INTRAVENOUS
  Administered 2022-02-21: 300 mg via INTRAVENOUS
  Administered 2022-02-21: 400 mg via INTRAVENOUS
  Administered 2022-02-21 (×2): 1000 mg via INTRAVENOUS

## 2022-02-21 MED ORDER — POLYETHYLENE GLYCOL 3350 17 G PO PACK: 17.0000 g | PACK | Freq: Every day | ORAL | Status: DC

## 2022-02-21 MED ORDER — FENTANYL CITRATE (PF) 100 MCG/2ML IJ SOLN
50.0000 ug | INTRAMUSCULAR | Status: DC | PRN
  Administered 2022-02-21: 100 ug via INTRAVENOUS
  Filled 2022-02-21: qty 2

## 2022-02-21 MED ORDER — SODIUM CHLORIDE 0.9 % IV SOLN: INTRAVENOUS | Status: DC | PRN

## 2022-02-21 MED ORDER — PANTOPRAZOLE SODIUM 40 MG IV SOLR: 40.0000 mg | Freq: Two times a day (BID) | INTRAVENOUS | Status: DC

## 2022-02-21 MED ORDER — IOHEXOL 300 MG/ML  SOLN
100.0000 mL | Freq: Once | INTRAMUSCULAR | Status: AC | PRN
  Administered 2022-02-21: 35 mL via INTRAVENOUS

## 2022-02-21 MED ORDER — DEXMEDETOMIDINE HCL IN NACL 400 MCG/100ML IV SOLN
INTRAVENOUS | Status: AC
  Administered 2022-02-21: 0.4 ug/kg/h via INTRAVENOUS
  Filled 2022-02-21: qty 100

## 2022-02-21 MED ORDER — FENTANYL BOLUS VIA INFUSION
50.0000 ug | INTRAVENOUS | Status: DC | PRN
  Administered 2022-02-22: 25 ug via INTRAVENOUS
  Administered 2022-02-23 – 2022-02-24 (×2): 50 ug via INTRAVENOUS
  Administered 2022-02-26: 100 ug via INTRAVENOUS
  Filled 2022-02-21: qty 100

## 2022-02-21 MED ORDER — FENTANYL CITRATE (PF) 100 MCG/2ML IJ SOLN
INTRAMUSCULAR | Status: AC
  Filled 2022-02-21: qty 2

## 2022-02-21 MED ORDER — CHLORHEXIDINE GLUCONATE CLOTH 2 % EX PADS
6.0000 | MEDICATED_PAD | Freq: Every day | CUTANEOUS | Status: DC
  Administered 2022-02-21 – 2022-03-02 (×7): 6 via TOPICAL

## 2022-02-21 MED ORDER — FENTANYL CITRATE (PF) 100 MCG/2ML IJ SOLN: 50.0000 ug | INTRAMUSCULAR | Status: DC | PRN

## 2022-02-21 MED ORDER — IOHEXOL 350 MG/ML SOLN
100.0000 mL | Freq: Once | INTRAVENOUS | Status: AC | PRN
  Administered 2022-02-21: 100 mL via INTRAVENOUS

## 2022-02-21 MED ORDER — SODIUM CHLORIDE 0.9 % IV SOLN
INTRAVENOUS | Status: DC | PRN
  Administered 2022-02-21: 5 ug/min via INTRAVENOUS

## 2022-02-21 MED ORDER — MIDAZOLAM HCL 2 MG/2ML IJ SOLN: 2.0000 mg | INTRAMUSCULAR | Status: DC | PRN

## 2022-02-21 MED ORDER — ORAL CARE MOUTH RINSE
15.0000 mL | OROMUCOSAL | Status: DC
  Administered 2022-02-22 – 2022-02-27 (×49): 15 mL via OROMUCOSAL

## 2022-02-21 MED ORDER — OCTREOTIDE LOAD VIA INFUSION
100.0000 ug | Freq: Once | INTRAVENOUS | Status: AC
  Administered 2022-02-21: 100 ug via INTRAVENOUS
  Filled 2022-02-21: qty 50

## 2022-02-21 MED ORDER — CHLORHEXIDINE GLUCONATE 0.12% ORAL RINSE (MEDLINE KIT)
15.0000 mL | Freq: Two times a day (BID) | OROMUCOSAL | Status: DC
  Administered 2022-02-21 – 2022-02-26 (×11): 15 mL via OROMUCOSAL

## 2022-02-21 MED ORDER — ROCURONIUM BROMIDE 10 MG/ML (PF) SYRINGE
PREFILLED_SYRINGE | INTRAVENOUS | Status: AC
  Filled 2022-02-21: qty 10

## 2022-02-21 MED ORDER — MIDAZOLAM HCL 2 MG/2ML IJ SOLN
INTRAMUSCULAR | Status: AC
  Administered 2022-02-21: 2 mg via INTRAVENOUS
  Filled 2022-02-21: qty 2

## 2022-02-21 MED ORDER — NOREPINEPHRINE 16 MG/250ML-% IV SOLN
0.0000 ug/min | INTRAVENOUS | Status: DC
  Administered 2022-02-21: 40 ug/min via INTRAVENOUS
  Administered 2022-02-22: 13 ug/min via INTRAVENOUS
  Filled 2022-02-21 (×2): qty 250

## 2022-02-21 MED ORDER — DEXMEDETOMIDINE HCL IN NACL 400 MCG/100ML IV SOLN
0.0000 ug/kg/h | INTRAVENOUS | Status: DC
  Administered 2022-02-21: 0.4 ug/kg/h via INTRAVENOUS
  Filled 2022-02-21: qty 100

## 2022-02-21 MED ORDER — NOREPINEPHRINE 4 MG/250ML-% IV SOLN
2.0000 ug/min | INTRAVENOUS | Status: DC
  Filled 2022-02-21 (×2): qty 250

## 2022-02-21 MED ORDER — NOREPINEPHRINE 4 MG/250ML-% IV SOLN
INTRAVENOUS | Status: AC
  Administered 2022-02-21: 4 mg
  Filled 2022-02-21: qty 250

## 2022-02-21 MED ORDER — MIDAZOLAM HCL 2 MG/2ML IJ SOLN
2.0000 mg | INTRAMUSCULAR | Status: AC | PRN
  Administered 2022-02-21 (×2): 2 mg via INTRAVENOUS
  Filled 2022-02-21 (×2): qty 2

## 2022-02-21 MED ORDER — PANTOPRAZOLE SODIUM 40 MG IV SOLR
40.0000 mg | Freq: Two times a day (BID) | INTRAVENOUS | Status: DC
  Administered 2022-02-21: 40 mg via INTRAVENOUS
  Filled 2022-02-21: qty 10

## 2022-02-21 MED ORDER — PANTOPRAZOLE SODIUM 40 MG IV SOLR
40.0000 mg | Freq: Once | INTRAVENOUS | Status: AC
  Administered 2022-02-21: 40 mg via INTRAVENOUS

## 2022-02-21 MED ORDER — SODIUM CHLORIDE 0.9 % IV SOLN
50.0000 ug/h | INTRAVENOUS | Status: DC
  Administered 2022-02-21 – 2022-02-23 (×5): 50 ug/h via INTRAVENOUS
  Filled 2022-02-21 (×10): qty 1

## 2022-02-21 MED ORDER — ETOMIDATE 2 MG/ML IV SOLN
INTRAVENOUS | Status: AC
  Filled 2022-02-21: qty 10

## 2022-02-21 MED ORDER — FENTANYL 2500MCG IN NS 250ML (10MCG/ML) PREMIX INFUSION
50.0000 ug/h | INTRAVENOUS | Status: DC
  Administered 2022-02-21: 12:00:00 50 ug/h via INTRAVENOUS
  Administered 2022-02-22 (×2): 200 ug/h via INTRAVENOUS
  Administered 2022-02-23: 10:00:00 150 ug/h via INTRAVENOUS
  Administered 2022-02-24: 100 ug/h via INTRAVENOUS
  Administered 2022-02-26 – 2022-02-27 (×2): 200 ug/h via INTRAVENOUS
  Filled 2022-02-21 (×8): qty 250

## 2022-02-21 MED ORDER — ONDANSETRON HCL 4 MG/2ML IJ SOLN
4.0000 mg | Freq: Four times a day (QID) | INTRAMUSCULAR | Status: DC | PRN
  Administered 2022-02-21: 4 mg via INTRAVENOUS
  Filled 2022-02-21: qty 2

## 2022-02-21 MED ORDER — MIDAZOLAM HCL 2 MG/2ML IJ SOLN
2.0000 mg | Freq: Once | INTRAMUSCULAR | Status: AC
  Administered 2022-02-21: 2 mg via INTRAVENOUS

## 2022-02-21 MED ORDER — SODIUM CHLORIDE 0.9 % IV SOLN
250.0000 mL | INTRAVENOUS | Status: DC
Start: 2022-02-21 — End: 2022-03-02
  Administered 2022-02-21 – 2022-02-26 (×2): 250 mL via INTRAVENOUS

## 2022-02-21 MED ORDER — FENTANYL CITRATE (PF) 100 MCG/2ML IJ SOLN: 50.0000 ug | Freq: Once | INTRAMUSCULAR | Status: DC

## 2022-02-21 SURGICAL SUPPLY — 15 items

## 2022-02-21 NOTE — Progress Notes (Signed)
Interventional Radiology Brief Note:  Per Dr. 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  Patient remains a candidate for TIPS.   MELD 23 places him at high mortality and encephalopathy risk.   Patient will also likely need a paracentesis at the time of the procedure.    Risks and benefits of TIPS, BRTO and/or additional variceal embolization were discussed with the patient and/or the patient's family including, but not limited to, infection, bleeding, damage to adjacent structures, worsening hepatic and/or cardiac function, worsening and/or the development of altered mental status/encephalopathy, non-target embolization and death.   This interventional procedure involves the use of X-rays and because of the nature of the planned procedure, it is possible that we will have prolonged use of X-ray fluoroscopy.  Potential radiation risks to you include (but are not limited to) the following: - A slightly elevated risk for cancer  several years later in life. This risk is typically less than 0.5% percent. This risk is low in comparison to the normal incidence of human cancer, which is 33% for women and 50% for men according to the American Cancer Society. - Radiation induced injury can include skin redness, resembling a rash, tissue breakdown / ulcers and hair loss (which can be temporary or permanent).   The likelihood of either of these occurring depends on the difficulty of the procedure and whether you are sensitive to radiation due to previous procedures, disease, or genetic conditions.   IF your procedure requires a prolonged use of radiation, you will be notified and given written instructions for further action.  It is your responsibility to monitor the irradiated area for the 2 weeks following the procedure and to notify your physician if you are concerned that you have suffered a radiation induced injury.    All of the patient's questions were answered,  patient is agreeable to proceed.  Consent signed and in chart.  Loyce Dys, MS RD PA-C

## 2022-02-21 NOTE — Progress Notes (Signed)
°   02/15/2022 0724  Assess: MEWS Score  BP (!) 85/57  Pulse Rate (!) 113  ECG Heart Rate (!) 113  SpO2 97 %  O2 Device Room Air  Assess: MEWS Score  MEWS Temp 0  MEWS Systolic 1  MEWS Pulse 2  MEWS RR 0  MEWS LOC 0  MEWS Score 3  MEWS Score Color Yellow  Assess: if the MEWS score is Yellow or Red  Were vital signs taken at a resting state? Yes  Focused Assessment Change from prior assessment (see assessment flowsheet)  Early Detection of Sepsis Score *See Row Information* Low  MEWS guidelines implemented *See Row Information* Yes  Treat  MEWS Interventions Escalated (See documentation below);Consulted Respiratory Therapy  Take Vital Signs  Increase Vital Sign Frequency  Yellow: Q 2hr X 2 then Q 4hr X 2, if remains yellow, continue Q 4hrs  Escalate  MEWS: Escalate Yellow: discuss with charge nurse/RN and consider discussing with provider and RRT  Notify: Charge Nurse/RN  Name of Charge Nurse/RN Notified Devin, RN  Date Charge Nurse/RN Notified 02/12/2022  Time Charge Nurse/RN Notified 0730  Notify: Provider  Provider Name/Title [facility]  Date Provider Notified 02/05/2022  Time Provider Notified 0720  Notification Type Face-to-face  Notification Reason Change in status  Provider response At bedside  Date of Provider Response 02/14/2022  Time of Provider Response 0720  Notify: Rapid Response  Name of Rapid Response RN Notified 01/26/2022  Date Rapid Response Notified 02/20/2022  Time Rapid Response Notified 0720  Document  Patient Outcome Transferred/level of care increased  Progress note created (see row info) Yes

## 2022-02-21 NOTE — Procedures (Signed)
Central Venous Catheter Insertion Procedure Note  Harold Howard  [id number]  1981/04/11  Date:02/09/2022  Time:11:31 AM   Provider Performing:Lailynn Southgate Kathie Rhodes Celine Mans   Procedure: Insertion of Non-tunneled Central Venous [phone]) with US guidance ([id number])   Indication(s) Medication administration  Consent Risks of the procedure as well as the alternatives and risks of each were explained to the patient and/or caregiver.  Consent for the procedure was obtained and is signed in the bedside chart  Anesthesia Topical only with 1% lidocaine   Timeout Verified patient identification, verified procedure, site/side was marked, verified correct patient position, special equipment/implants available, medications/allergies/relevant history reviewed, required imaging and test results available.  Sterile Technique Maximal sterile technique including full sterile barrier drape, hand hygiene, sterile gown, sterile gloves, mask, hair covering, sterile ultrasound probe cover (if used).  Procedure Description Area of catheter insertion was cleaned with chlorhexidine and draped in sterile fashion.  With real-time ultrasound guidance a introducer sheath was placed into the right internal jugular vein. Nonpulsatile blood flow and easy flushing noted in all ports.  The catheter was sutured in place and sterile dressing applied.  Complications/Tolerance None; patient tolerated the procedure well. Chest X-ray is ordered to verify placement for internal jugular or subclavian cannulation.   Chest x-ray is not ordered for femoral cannulation.  EBL Minimal  Specimen(s) None  Durel Salts, MD Pulmonary and Critical Care Medicine [facility] 01/30/2022 11:31 AM Pager: see AMION  If no response to pager, please call critical care on call (see AMION) until 7pm After 7:00 pm call Elink

## 2022-02-21 NOTE — Plan of Care (Signed)
°  Problem: Activity: Goal: Ability to tolerate increased activity will improve Outcome: Progressing   Problem: Respiratory: Goal: Ability to maintain a clear airway and adequate ventilation will improve Outcome: Progressing   Problem: Role Relationship: Goal: Method of communication will improve Outcome: Progressing   Problem: Education: Goal: Ability to identify signs and symptoms of gastrointestinal bleeding will improve Outcome: Progressing   Problem: Bowel/Gastric: Goal: Will show no signs and symptoms of gastrointestinal bleeding Outcome: Progressing   Problem: Clinical Measurements: Goal: Complications related to the disease process, condition or treatment will be avoided or minimized Outcome: Progressing   Problem: Education: Goal: Knowledge of General Education information will improve Description: Including pain rating scale, medication(s)/side effects and non-pharmacologic comfort measures Outcome: Progressing   Problem: Health Behavior/Discharge Planning: Goal: Ability to manage health-related needs will improve Outcome: Progressing   Problem: Clinical Measurements: Goal: Ability to maintain clinical measurements within normal limits will improve Outcome: Progressing Goal: Will remain free from infection Outcome: Progressing Goal: Diagnostic test results will improve Outcome: Progressing Goal: Respiratory complications will improve Outcome: Progressing Goal: Cardiovascular complication will be avoided Outcome: Progressing   Problem: Activity: Goal: Risk for activity intolerance will decrease Outcome: Progressing   Problem: Coping: Goal: Level of anxiety will decrease Outcome: Progressing   Problem: Elimination: Goal: Will not experience complications related to bowel motility Outcome: Progressing Goal: Will not experience complications related to urinary retention Outcome: Progressing   Problem: Pain Managment: Goal: General experience of comfort  will improve Outcome: Progressing   Problem: Safety: Goal: Ability to remain free from injury will improve Outcome: Progressing   Problem: Skin Integrity: Goal: Risk for impaired skin integrity will decrease Outcome: Progressing   Problem: Fluid Volume: Goal: Will show no signs and symptoms of excessive bleeding Outcome: Not Progressing   Problem: Nutrition: Goal: Adequate nutrition will be maintained Outcome: Not Progressing

## 2022-02-21 NOTE — Progress Notes (Signed)
Previously placed by JB, RN 03-10-22 at 0836   An USGPIV (ultrasound guided PIV) has been placed for short-term vasopressor infusion. A correctly placed ivWatch must be used when administering Vasopressors. Should this treatment be needed beyond 72 hours, central line access should be obtained.  It will be the responsibility of the bedside nurse to follow best practice to prevent extravasations.

## 2022-02-21 NOTE — Progress Notes (Addendum)
IVT team vasopressor order populated, a PIV was placed via u/s by JB, RN .  RN aware to use PIV that was placed via u/s in LLF and apply iwatch.

## 2022-02-21 NOTE — Progress Notes (Addendum)
Daily Rounding Note  02/03/2022, 8:14 AM  LOS: 8 days   SUBJECTIVE:   Chief complaint:   Recurrent hematemesis this morning.  Transferred back from hospitalist service to ICU this morning.  Heart rate in the 120s.  Blood pressures as low as 85/57 but currently 110/92.  Re intubated for airway protection.  OBJECTIVE:         Vital signs in last 24 hours:    Temp:  [97.7 F (36.5 C)-98.2 F (36.8 C)] 97.9 F (36.6 C) (02/27 0304) Pulse Rate:  [68-113] 113 (02/27 0724) Resp:  [16-19] 19 (02/27 0304) BP: (85-95)/(53-62) 85/57 (02/27 0724) SpO2:  [94 %-99 %] 97 % (02/27 0724) Weight:  [92.5 kg] 92.5 kg (02/27 0500) Last BM Date : 02/20/22 Filed Weights   02/19/22 0500 02/20/22 0500 02/22/2022 0500  Weight: 91.3 kg 93.3 kg 92.5 kg   General: Ill appearing, unresponsive on vent. Heart: Regular, tachycardic. Chest: No labored or stressful breathing pattern. Abdomen: Distended, moderately tense.  Not tender.  Small to medium sized bruises.  Scrotal edema.  Liquid stool pooling between the thighs is brown, not melenic or bloody. Extremities: Bilateral lower extremity edema with pitting. Neuro/Psych: Unresponsive after etomidate.  Intake/Output from previous day: 02/26 0701 - 02/27 0700 In: 474 [P.O.:474] Out: 700 [Urine:700]  Intake/Output this shift: No intake/output data recorded.  Lab Results: Recent Labs    02/19/22 0056 02/20/22 0635 01/30/2022 0042  WBC 9.8 9.8 12.2*  HGB 8.4* 8.8* 9.7*  HCT 26.8* 27.8* 30.8*  PLT 56* 75* 90*   BMET Recent Labs    02/19/22 0056 02/20/22 0635 01/26/2022 0042  NA 136 132* 130*  K 3.4* 4.3 5.5*  CL 104 99 97*  CO2 25 24 25   GLUCOSE 113* 103* 97  BUN 14 17 18   CREATININE 0.88 0.92 0.90  CALCIUM 7.8* 8.0* 8.1*   LFT Recent Labs    02/19/22 0056 02/20/22 0635 02/03/2022 0042  PROT 4.7* 5.0* 5.6*  ALBUMIN 2.0* 2.1* 2.2*  AST 83* 76* 93*  ALT 70* 56* 55*  ALKPHOS 95  96 106  BILITOT 4.5* 5.3* 6.0*   PT/INR Recent Labs    02/20/22 0635 02/15/2022 0042  LABPROT 18.5* 17.3*  INR 1.5* 1.4*   Hepatitis Panel No results for input(s): HEPBSAG, HCVAB, HEPAIGM, HEPBIGM in the last 72 hours.  Studies/Results: No results found.  Scheduled Meds:  sodium chloride   Intravenous Once   chlorhexidine  15 mL Mouth/Throat BID   docusate  100 mg Per Tube BID   feeding supplement  237 mL Oral TID BM   folic acid  1 mg Oral Daily   furosemide  40 mg Oral Daily   iron polysaccharides  150 mg Oral Daily   lactulose  20 g Oral BID   midodrine  5 mg Oral TID WC   multivitamin with minerals  1 tablet Oral Daily   nicotine  21 mg Transdermal Daily   octreotide  100 mcg Intravenous Once   pantoprazole (PROTONIX) IV  40 mg Intravenous Q12H   polyethylene glycol  17 g Per Tube Daily   rifaximin  550 mg Oral BID   sodium chloride flush  10-40 mL Intracatheter Q12H   thiamine  100 mg Oral Daily   Continuous Infusions:  sodium chloride 10 mL/hr at 02/17/22 1100   dexmedetomidine (PRECEDEX) IV infusion     norepinephrine     octreotide  (SANDOSTATIN)    IV infusion  PRN Meds:.sodium chloride, albuterol, fentaNYL (SUBLIMAZE) injection, fentaNYL (SUBLIMAZE) injection, lip balm, sodium chloride flush  ASSESMENT:   Decompensated alcoholic cirrhosis.  Suspect superimposed alcoholic hepatitis.  Variceal bleed with hematemesis, hemorrhagic shock.  02/13/2021 EGD with large esophageal varices, stigmata of bleeding.  EVL x11.  Portal hypertensive gastropathy.  Suspicion for gastric varices without bleeding stigmata.  Completed 72 hours Sandostatin and PPI early last week.  Self extubated and remained off vent.  Off pressors since 2/22.  Completed 5 days ceftriaxone, SBP prophylaxis.  R/O bleeding from varices at site of EVL, r/O bleeding gastric varices.  Coreg, Lasix, Aldactone added 2/23.  Blood loss anemia.  Status post massive blood product transfusion protocol with  PRBCs, platelets, FFP at admission a week ago.  Hgb this AM 9.7 (prior to hematemesis), 8.8 yest AM.    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    Ongoing Lactulose po  and rifaximin.    Hyponatremia.  Coagulopathy.  Overall improved but persists.   PLAN   The first of 3 ordered PRBCs initiated.  In order to keep portal pressures controlled, need to avoid over transfusion.   A repeat CBC has been ordered.  Repeat EGD, timing TBD.  At bedside.  May need TIPS.    Continue the resumed octreotide.  Would switch to Protonix drip.  Hold oral/enteric medications for now.  Await repeat CBC, PT/INR.   Restart Rocephin    Azucena Freed  02/02/2022, 8:14 AM Phone [phone]   GI Attending:  I have also seen and evaluated the patient and spoken to his significant other. The risks and benefits as well as alternatives of endoscopic procedure(s) have been discussed and reviewed. All questions answered. The patient agrees to proceed.  He is having recurrent UGI bleed and will proceed as outlined above.   MELD = 22  Gatha Mayer, MD, [facility] 02/20/2022 9:33 AM

## 2022-02-21 NOTE — Progress Notes (Signed)
PT Cancellation Note  Patient Details Name: Harold Howard MRN: [id number] DOB: Oct 03, 1981   Cancelled Treatment:    Reason Eval/Treat Not Completed: Medical issues which prohibited therapy. Pt vomiting blood this AM and transferred to ICU.   Angelina Ok [facility] 02/07/2022, 8:07 AM Skip Mayer PT Acute Rehabilitation Services Pager [phone] Office [phone]

## 2022-02-21 NOTE — Anesthesia Procedure Notes (Signed)
Arterial Line Insertion Start/End02/25/2023 5:12 PM, 02/21/2022 5:18 PM Performed by: Shelton Silvas, MD  Patient location: Pre-op. Preanesthetic checklist: patient identified, IV checked, site marked, risks and benefits discussed, surgical consent, monitors and equipment checked, pre-op evaluation, timeout performed and anesthesia consent Lidocaine 1% used for infiltration Left, radial was placed Catheter size: 20 G Hand hygiene performed  and maximum sterile barriers used   Attempts: 1 Procedure performed without using ultrasound guided technique. Following insertion, dressing applied and Biopatch. Post procedure assessment: normal and unchanged  Patient tolerated the procedure well with no immediate complications.

## 2022-02-21 NOTE — Anesthesia Preprocedure Evaluation (Addendum)
Anesthesia Evaluation  Patient identified by MRN, date of birth, ID band Patient unresponsive    Reviewed: Allergy & Precautions, Patient's Chart, lab work & pertinent test results, Unable to perform ROS - Chart review onlyPreop documentation limited or incomplete due to emergent nature of procedure.  Airway Mallampati: Intubated  TM Distance: >3 FB Neck ROM: Full    Dental  (+) Teeth Intact   Pulmonary former smoker,    breath sounds clear to auscultation       Cardiovascular negative cardio ROS   Rhythm:Regular Rate:Normal     Neuro/Psych negative neurological ROS  negative psych ROS   GI/Hepatic negative GI ROS, Neg liver ROS,   Endo/Other  negative endocrine ROS  Renal/GU negative Renal ROS     Musculoskeletal negative musculoskeletal ROS (+)   Abdominal   Peds  Hematology negative hematology ROS (+)   Anesthesia Other Findings   Reproductive/Obstetrics                            Anesthesia Physical Anesthesia Plan  ASA: 4 and emergent  Anesthesia Plan: General   Post-op Pain Management:    Induction: Inhalational  PONV Risk Score and Plan: 2 and Ondansetron, Midazolam and Treatment may vary due to age or medical condition  Airway Management Planned: Oral ETT  Additional Equipment: Arterial line  Intra-op Plan:   Post-operative Plan: Post-operative intubation/ventilation  Informed Consent:     History available from chart only and Only emergency history available  Plan Discussed with: CRNA  Anesthesia Plan Comments:        Anesthesia Quick Evaluation

## 2022-02-21 NOTE — Progress Notes (Signed)
Patient began vomiting significant amounts of dark red blood clots. Suction set up, NS bolus initiated, and blood transfusions ordered. Dr. Waldron Labs, Rapid Response team, and RT arrived to bedside. Patient A&O and transferred to 53M-15. Report called to 53M RN.

## 2022-02-21 NOTE — Procedures (Signed)
Intubation Procedure Note  Harold Howard  [id number]  1981/07/13  Date:01/28/2022  Time:8:37 AM   Provider Performing:Marks Scalera    Procedure: Intubation (31500)  Indication(s) Respiratory Failure  Consent Risks of the procedure as well as the alternatives and risks of each were explained to the patient and/or caregiver.  Consent for the procedure was obtained and is signed in the bedside chart   Anesthesia Etomidate and Rocuronium   Time Out Verified patient identification, verified procedure, site/side was marked, verified correct patient position, special equipment/implants available, medications/allergies/relevant history reviewed, required imaging and test results available.   Sterile Technique Usual hand hygeine, masks, and gloves were used   Procedure Description Patient positioned in bed supine.  Sedation given as noted above.  Patient was intubated with endotracheal tube using  DL .  View was Grade 1 full glottis .  Number of attempts was 1.  Colorimetric CO2 detector was consistent with tracheal placement.   Complications/Tolerance None; patient tolerated the procedure well. Chest X-ray is ordered to verify placement.   EBL Minimal   Specimen(s) None

## 2022-02-21 NOTE — Progress Notes (Signed)
NAME:  Harold Howard, MRN:  [id number], DOB:  04/07/1981, LOS: 8 ADMISSION DATE:  02/20/2022, CONSULTATION DATE:  02/20/2022 REFERRING MD:  Harold Howard, CHIEF COMPLAINT:  hemorrhagic shock   History of Present Illness:  Harold Howard is a 41 y/o gentleman with a history of daily ETOH use who presents with 1 month of vomiting blood and dark stools consistent with upper GI bleed s/p EGD on 2/19 with banding of esophageal varices.   History is provided by the ED and his step sister as he is intubated and sedated. His family is estranged due to his ETOH consumption. His step-sister indicated she is the only family he is in contact with. He drinks about a fifth per day, but has been working on cutting back recently, but he still drinks about a half gallon of liquor every 2-3 days. He came home from work early yesterday after texting his roommate a picture of a toilet bowl full of BRB. He was found on the floor this morning screaming to call 911. No emesis with EMS. Received 500cc crystalloid and zofran. He was hypotensive and developed worsening encephalopathy in the ED and required intubation. He had a seizure in the ED.  He received 8 pRBCs. PCCM consulted for admission. GI consulted by the ED.  No known use of NSAIDs or anticoagulants. His step-sister is not clear on his medical history.  Pertinent  Medical History  ETOH abuse  Significant Hospital Events: Including procedures, antibiotic start and stop dates in addition to other pertinent events   2/19 admission, intubated sedated, EGD  2/20 paracentesis  2/22 patient self-extubated, off pressors  2/27 Recurrent upper GI bleed on the floor, stabilized and transferred back to the ICU. GI notified   Procedures:  2/19 EGD: EGD yesterday with large esophageal varices, suspected gastric varices, and portal hypertensive gastropathy   Antibiotics:  2/19 Ceftriaxone for SBP prophylaxis   Interim History / Subjective:  Seen lying in bed lethargic and  pale   Objective   Blood pressure 95/62, pulse 70, temperature 97.9 F (36.6 C), temperature source Oral, resp. rate 19, height 5\' 9"  (1.753 m), weight 92.5 kg, SpO2 94 %.        Intake/Output Summary (Last 24 hours) at 02/02/2022 0725 Last data filed at 02/20/2022 1635 Gross per 24 hour  Intake 474 ml  Output 700 ml  Net -226 ml    Filed Weights   02/19/22 0500 02/20/22 0500 02/08/2022 0500  Weight: 91.3 kg 93.3 kg 92.5 kg    Examination: General: Acute on chronically ill appearing adult male lying in bed with recent massive hematemesis  HEENT: [place]/AT, MM pink/moist, PERRL,  Neuro: Alert and oriented but lethargic  CV: s1s2 regular rate and rhythm, no murmur, rubs, or gallops,  PULM: No increased work of breathing, placed on supplemental oxygen, diminished bilaterally  GI: soft, bowel sounds active in all 4 quadrants, non-tender, non-distended Extremities: warm/dry, no edema  Skin: no rashes or lesions  Resolved Hospital Problem list   Hypocalcemia  Severe metabolic acidosis Hemorrhagic shock   Assessment & Plan:   Acute on subacute GIB, recurrent as of 2/27 Esophageal Varices s/p banding x11 P: Transfer back to ICU  GI notified of re-bleed  Repeat CBC  Start Octreotide drip  Continue BID PPI Type and screen  Transfuse as need for hgb greater than 7  ETOH abuse  Decompensated cirrhosis  -Ammonia downtrending to 44. Hyperbilirubinemia and elevated transaminase  -Due to hypoperfusion, ETOH abuse.  -2/19 RUQ 11-29-2001:  cirrhotic hepatic morphology, abdominopelvic ascites, no gallstones or biliary dilation, bidirectional flow is suspected in the main portal vein with varicosities at the hepatic hilum.  -MELD score 24.  -Paracentesis on 2/20 not consistent with SBP. SAAG is 1 g/dl. Liver work up negative thus far and etiology of cirrhosis is likely secondary to alcohol use. Peritoneal fluid culture showed no growth. P: GI following  Continue Lactulose  Supportive care   Supplement thiamine, folate, and MVI ETOH cessation education  S/P Phenobarbital taper  At risk for recurrent hypoxic respiratory failure given massive hematemesis in the setting of upper GI bleed  P: Prophylactic intubated on arrival to ICU once intubated; Continue ventilator support with lung protective strategies  Wean PEEP and FiO2 for sats greater than 90%. Head of bed elevated 30 degrees. Plateau pressures less than 30 cm H20.  Follow intermittent chest x-ray and ABG.   SAT/SBT as tolerated, mentation preclude extubation  Ensure adequate pulmonary hygiene  Follow cultures  VAP bundle in place  PAD protocol  Hyperkalemia  P: Once dose lokelma now Trend Bmet   Thrombocytopenia  Iron deficiency  -Ferritin 22 P: Iron supplementation     Seizure -Likely due to profound hypoperfusion and hypoxia -Head CT on 2/19 showed no acute intracranial pathology.  P: Maintain neuro protective measures Nutrition and bowel regiment  Seizure precautions  Aspirations precautions   Best Practice (right click and "Reselect all SmartList Selections" daily)   Diet/type: NPO DVT prophylaxis: prophylactic heparin  GI prophylaxis: PPI Lines: N/A   Foley:  N/A Code Status:  full code Last date of multidisciplinary goals of care discussion [ with step sister on 2/19]   Critical care:  CRITICAL CARE Performed by: Tilda Samudio D. Harris  Total critical care time: 45 minutes  Critical care time was exclusive of separately billable procedures and treating other patients.  Critical care was necessary to treat or prevent imminent or life-threatening deterioration.  Critical care was time spent personally by me on the following activities: development of treatment plan with patient and/or surrogate as well as nursing, discussions with consultants, evaluation of patient's response to treatment, examination of patient, obtaining history from patient or surrogate, ordering and performing  treatments and interventions, ordering and review of laboratory studies, ordering and review of radiographic studies, pulse oximetry and re-evaluation of patient's condition.  Macen Joslin D. Tiburcio Pea, NP-C [facility] Personal contact information can be found on Amion  01/30/2022, 8:10 AM

## 2022-02-21 NOTE — Progress Notes (Signed)
Patient was transported to CT & back to 2M15.

## 2022-02-21 NOTE — Transfer of Care (Signed)
Immediate Anesthesia Transfer of Care Note  Patient: Harold Howard  Procedure(s) Performed: IR WITH ANESTHESIA  Patient Location: ICU  Anesthesia Type:General  Level of Consciousness: Patient remains intubated per anesthesia plan  Airway & Oxygen Therapy: Patient remains intubated per anesthesia plan and Patient placed on Ventilator (see vital sign flow sheet for setting)  Post-op Assessment: Report given to RN and Post -op Vital signs reviewed and stable  Post vital signs: Reviewed and stable  Last Vitals:  Vitals Value Taken Time  BP    Temp    Pulse 82 02/05/2022 2155  Resp 22 02/06/2022 2155  SpO2 100 % 02/10/2022 2155  Vitals shown include unvalidated device data.  Last Pain:  Vitals:   02/01/2022 1609  TempSrc: Axillary  PainSc:          Complications: No notable events documented.

## 2022-02-21 NOTE — Op Note (Signed)
[facility] Patient Name: Harold Howard Procedure Date : 02/10/2022 MRN: [id number] Attending MD: Iva Boop , MD Date of Birth: [birth year] CSN: [id number] Age: 41 Admit Type: Inpatient Procedure:                Upper GI endoscopy Indications:              Hematemesis, Active gastrointestinal bleeding,                            Esophageal varices with bleeding, Follow-up of                            esophageal varices with bleeding, For therapy of                            esophageal varices with bleeding 11 bands blaced                            2/19 and recurrent bleeding today Providers:                Iva Boop, MD, Adolph Pollack, RN, Priscella Mann, Technician Referring MD:              Medicines:                Precedex drip + fentanyl.versed in ICU, intubated Complications:            No immediate complications. Estimated Blood Loss:     Estimated blood loss: none. Procedure:                Pre-Anesthesia Assessment:                           - Prior to the procedure, a History and Physical                            was performed, and patient medications and                            allergies were reviewed. The patient's tolerance of                            previous anesthesia was also reviewed. The risks                            and benefits of the procedure and the sedation                            options and risks were discussed with the patient.                            All questions were answered, and informed consent  was obtained. Prior Anticoagulants: The patient has                            taken no previous anticoagulant or antiplatelet                            agents. ASA Grade Assessment: IV - A patient with                            severe systemic disease that is a constant threat                            to life. After reviewing the risks and benefits,                             the patient was deemed in satisfactory condition to                            undergo the procedure.                           After obtaining informed consent, the endoscope was                            passed under direct vision. Throughout the                            procedure, the patient's blood pressure, pulse, and                            oxygen saturations were monitored continuously. The                            GIF-H190 ([id number]) Olympus endoscope was introduced                            through the mouth, and advanced to the second part                            of duodenum. The [id number]-[id number] ([id number]) Therapeutic                            endoscope was introduced through the and advanced                            to the. The upper GI endoscopy was accomplished                            without difficulty. The patient tolerated the                            procedure well. Scope In: Scope Out: Findings:      Large (> 5 mm) varices were found in the entire esophagus.  Red blood was found in the lower third of the esophagus.      Red blood was found in the cardia and in the gastric fundus.      Portal hypertensive gastropathy was found in the entire examined stomach.      The examined duodenum was normal.      The cardia and gastric fundus were normal on retroflexion. Impression:               - Large (> 5 mm) esophageal varices. Some w/                            nipples - multiple banding ulcers s/p banding x 11                            8 days ago some w/ nipples - could not ID an exact                            source of bleeding                           - Red blood in the lower third of the esophagus.                           - Red blood in the cardia and in the gastric fundus.                           - Portal hypertensive gastropathy.                           - Normal examined duodenum.                           - No specimens  collected. Recommendation:           - CONSULT INTERVENTIONAL RADIOLOGY FOR TIPS                            (ORDERED) MELD IS 22 BUT THINK ACUTE GI BLEEDING                            UNLIKELY TO BE CONTROLLED ENDOSCOPICALLY                           - STAT ECHO IN PREP FOR ABOVE                           - WILL FOLLOW CAN GO BACK AND TRY TO BAND IF TIPS                            NOT AN OPTION DEPENDING UPON CLINICAL COURSE -                            THERE WERE NUMEROUS BANDING ULCERS WHICH MAY BE  PART OF THE BLEEDING SO I DECIDED NOT TO REBAND DUE                            TO CONCERN OVE ADDING ON TO PROBLEMS - DIFFICULT                            SITUATION BUT INR OK, PLTS NOT SUPER LOW AND KIDNEY                            FX GOOD SO I THINK REASONABLE RISK DESPITE BEING >                            THAN STANDARD MELD THRESHOLD BUT DEFER TO IR                            ULTIMATELY                           - STAY ON OCTREOTIDE AND PPI INFUSION Procedure Code(s):        --- Professional ---                           [phone], Esophagogastroduodenoscopy, flexible,                            transoral; diagnostic, including collection of                            specimen(s) by brushing or washing, when performed                            (separate procedure) Diagnosis Code(s):        --- Professional ---                           K22.8, Other specified diseases of esophagus                           K92.2, Gastrointestinal hemorrhage, unspecified                           K76.6, Portal hypertension                           K31.89, Other diseases of stomach and duodenum                           K92.0, Hematemesis                           I85.01, Esophageal varices with bleeding CPT copyright 2019 [employer]. All rights reserved. The codes documented in this report are preliminary and upon coder review may  be revised to meet current  compliance requirements. Iva Boop, MD 02/11/2022 10:42:17 AM This report has been signed electronically. Number of Addenda: 0

## 2022-02-21 NOTE — Progress Notes (Signed)
Pt to unit around 7:45am; pt bed and gown covered with blood and large blood clots . Previous RN states patient has been vomiting blood since 0530.   Patient alert and oriented; Lung sounds clear, diminished in bases.   Stomach taut and very distended.   Patient appears pale and cool to touch.  0830: Dr Merrily Pew at bedside; pt intubated. 2 new IVs started. Pt has orders for 3 units of Emergency blood. 1st unit hanging upon arrival to unit.   Pt bathed, CHG  wipes used, new sacrum foam placed. Bloody stool noted. New linens.   0945- GI team at bedside; unable to band varices due bleeding. Dr Leone Payor will call IR to consult for TIPS procedure.   1015- suction canister discared with 600cc of bright red blood.   1028-pt restless puling at lines and sitting up in bed. Versed given  1035- Pt BP requiring pressors. See vital signs. Levophed initiated   1130- pt BP remains low despite levo; Dr Celine Mans at bedside to place single lumen cordis for rapid blood infusion.   1330-massive blood transfusion initiated. Pt received 4 units PRBCs, 4 units Plasma via [facility] rapid infuser; followed by 1 unit platelets administered via IV pump.  1430- Pt BP improved; Vaso d/c'd per Dr Celine Mans.1055- large liquid bloody stool with clots noted. Pt cleaned and new linens placed on bed.   1600- pt to CT; massive hematemesis and liquid bloody stool with large clots noted. Vaso restarted due to BP; see vitals.   1630- Massive blood transfusion restarted. Pt received 4 units PRBCs, 4 units Plasma via [facility] rapid infuser; 1 unit platelets primed and given to CRNA taking pt to IR.   1700- pt to IR for TIPS procedure; pt family Tresa Endo and [place]) aware and been in constant communication over the day.

## 2022-02-21 NOTE — Progress Notes (Signed)
Nutrition Follow-up  DOCUMENTATION CODES:   Non-severe (moderate) malnutrition in context of social or environmental circumstances  INTERVENTION:   D/C PO Ensure.  If unable to extubate within next 24 hours, recommend placing OG/NG tube or Cortrak (service available M-W-F) for enteral nutrition support:  Vital 1.5 begin at 20 ml/h, increase by 10 ml every 4 hours to goal rate of 60 ml/h. Prosource TF 45 ml BID. Provides 2240 kcal, 119 gm protein, 1100 ml free water daily.   If unable to obtain access for enteral nutrition, may need to consider TPN.  NUTRITION DIAGNOSIS:   Moderate Malnutrition related to social / environmental circumstances (EtOH abuse) as evidenced by mild fat depletion, moderate muscle depletion.  Ongoing   GOAL:   Patient will meet greater than or equal to 90% of their needs  Currently unmet  MONITOR:   PO intake, Supplement acceptance, Labs, Weight trends, I & O's  REASON FOR ASSESSMENT:   Ventilator    ASSESSMENT:   41 year old male who presented to the ED on 2/19 with hematemesis and dark stools x 1 month. Pt had a seizure in the ED and required intubation for airway protection. PMH of EtOH abuse. Pt admitted with hemorrhagic shock, GI bleed, AKI.  Patient has been on a 2 gm sodium diet. Intake of meals has been minimal (0-20% meal intakes documented). Patient was re-intubated this morning.  S/P upper endoscopy this morning d/t hematemesis. Large esophageal varices found; S/P banding x 11 on 2/19. No definitive source of bleeding found. IR to be consulted for TIPS.  Currently no enteral access available.   Patient is currently intubated on ventilator support MV: 12.9 L/min Temp (24hrs), Avg:98.2 F (36.8 C), Min:97.7 F (36.5 C), Max:98.9 F (37.2 C)   Labs reviewed. Na 130 CBG: 126  Medications reviewed and include Colace, folic acid, Lasix, Niferex, lactulose, MVI with minerals, Protonix, Miralax, thiamine, Precedex, Levophed,  octreotide.  Admission weight 76 kg Current weight 92.5 kg I/O +4.6 L since admission  Diet Order:   Diet Order             Diet NPO time specified  Diet effective now                   EDUCATION NEEDS:   Education needs have been addressed  Skin:  Skin Assessment: Reviewed RN Assessment  Last BM:  2/26 type 7  Height:   Ht Readings from Last 1 Encounters:  02/20/2022 5\' 9"  (1.753 m)    Weight:   Wt Readings from Last 1 Encounters:  02/11/2022 92.5 kg    BMI:  Body mass index is 30.11 kg/m.  Estimated Nutritional Needs:   Kcal:  2100-2300  Protein:  105-120 grams  Fluid:  2.0 L/day    02/23/22 RD, LDN, CNSC Please refer to Amion for contact information.

## 2022-02-21 NOTE — Consult Note (Signed)
Chief Complaint: Patient was seen in consultation today for upper GI bleed  Referring Physician(s): Dr. Stan Head  Supervising Physician: Oley Balm  Patient Status: [facility] - In-pt  History of Present Illness: Harold Howard is a 41 y.o. male with past medical history of daily ETOH use who presented to [facility] ED with hematemesis and melena. Patient hypotension, encephalopathy, and seizure in the ED with HgB of 1.9 requiring intubation and rapid transfusion. He underwent endoscopy 2/19 revealing large varices with stigmata of recent bleeding in the entire esophagus with banding x11.  Patient initially stabilized, however overnight developed hematemesis with hypotension.  He was intubated for airway protection and is requiring pressors as well as multiple blood transfusions.  Repeat EGD performed this AM by Dr. Leone Payor showed large bleeding varices with blood in the esophagus.  Recommendation was for consultation to IR for possible TIPS/BRTO.   Past Medical History:  Diagnosis Date   ETOH abuse     Past Surgical History:  Procedure Laterality Date   BACK SURGERY     ESOPHAGEAL BANDING  02/14/2022   Procedure: ESOPHAGEAL BANDING;  Surgeon: Benancio Deeds, MD;  Location: [facility] ENDOSCOPY;  Service: Gastroenterology;;   ESOPHAGOGASTRODUODENOSCOPY N/A 02/09/2022   Procedure: ESOPHAGOGASTRODUODENOSCOPY (EGD);  Surgeon: Benancio Deeds, MD;  Location: [facility] ENDOSCOPY;  Service: Gastroenterology;  Laterality: N/A;    Allergies: Patient has no known allergies.  Medications: Prior to Admission medications   Not on File     Family History  Problem Relation Age of Onset   Alcoholism Mother     Social History   Socioeconomic History   Marital status: Single    Spouse name: Not on file   Number of children: Not on file   Years of education: Not on file   Highest education level: Not on file  Occupational History   Not on file  Tobacco Use   Smoking status: Former     Types: Cigarettes   Smokeless tobacco: Never  Vaping Use   Vaping Use: Never used  Substance and Sexual Activity   Alcohol use: Yes   Drug use: Never   Sexual activity: Not on file  Other Topics Concern   Not on file  Social History Narrative   Not on file   Social Determinants of Health   Financial Resource Strain: Not on file  Food Insecurity: Not on file  Transportation Needs: Not on file  Physical Activity: Not on file  Stress: Not on file  Social Connections: Not on file     Review of Systems: A 12 point ROS discussed and pertinent positives are indicated in the HPI above.  All other systems are negative.  Review of Systems  Unable to perform ROS: Intubated   Vital Signs: BP (!) 88/55    Pulse (!) 111    Temp 98.2 F (36.8 C) (Axillary)    Resp (!) 25    Ht 5\' 9"  (1.753 m)    Wt 203 lb 14.8 oz (92.5 kg)    SpO2 94%    BMI 30.11 kg/m   Physical Exam Vitals and nursing note reviewed.  Constitutional:      General: He is not in acute distress.    Appearance: Normal appearance. He is not ill-appearing.  HENT:     Mouth/Throat:     Comments: Intubated, ETT in place.  BRB from mouth Cardiovascular:     Rate and Rhythm: Normal rate.  Pulmonary:     Comments: intubated Abdominal:  General: There is distension.  Skin:    General: Skin is warm and dry.  Neurological:     Mental Status: He is alert.     Comments: Sedated, intubated  Psychiatric:        Mood and Affect: Mood normal.        Behavior: Behavior normal.        Thought Content: Thought content normal.        Judgment: Judgment normal.     MD Evaluation Heart: WNL Abdomen: Other (comments) Abdomen comments: ascites Chest/ Lungs: WNL ASA  Classification: 4   Imaging: CT HEAD WO CONTRAST ( )  Result Date: 01/26/2022 CLINICAL DATA:  Seizure, altered mental status EXAM: CT HEAD WITHOUT CONTRAST TECHNIQUE: Contiguous axial images were obtained from the base of the skull through the vertex  without intravenous contrast. RADIATION DOSE REDUCTION: This exam was performed according to the departmental dose-optimization program which includes automated exposure control, adjustment of the mA and/or kV according to patient size and/or use of iterative reconstruction technique. COMPARISON:  None. FINDINGS: Brain: No evidence of acute infarction, hemorrhage, hydrocephalus, extra-axial collection or mass lesion/mass effect. Vascular: No hyperdense vessel or unexpected calcification. Skull: Normal. Negative for fracture or focal lesion. Sinuses/Orbits: No acute finding. Other: None. IMPRESSION: No acute intracranial pathology. Electronically Signed   By: Jearld Lesch M.D.   On: 01/29/2022 14:30   DG CHEST PORT 1 VIEW  Result Date: 08-Mar-2022 CLINICAL DATA:  Central line placement. EXAM: PORTABLE CHEST 1 VIEW COMPARISON:  02/14/2022 FINDINGS: 1148 hours. Endotracheal tube tip is 4.7 cm above the base of the carina. Right IJ sheath noted with tip overlying the region of the innominate vein confluence. No evidence for pneumothorax. No pulmonary edema or pleural effusion. Probable small hiatal hernia. The cardiopericardial silhouette is within normal limits for size. Telemetry leads overlie the chest. IMPRESSION: 1. Endotracheal tube tip 4.7 cm above the base of the carina. 2. Right IJ sheath with tip overlying the region of the innominate vein confluence. 3. No pulmonary edema or focal consolidation. Electronically Signed   By: Kennith Center M.D.   On: 2022/03/08 12:00   Portable Chest xray  Result Date: 02/14/2022 CLINICAL DATA:  Respiratory failure EXAM: PORTABLE CHEST 1 VIEW COMPARISON:  Chest radiograph 1 day prior FINDINGS: Endotracheal tube tip is approximately 2.8 cm from the carina. The enteric catheter has been removed. The cardiomediastinal silhouette is normal. There is no focal consolidation or pulmonary edema. There is no pleural effusion or pneumothorax. Overall, aeration is not significantly  changed compared to the study from 1 day prior. The bones are stable. IMPRESSION: No focal consolidation or pleural effusion. Overall, no significant interval change since the study from 1 day prior. Electronically Signed   By: Lesia Hausen M.D.   On: 02/14/2022 08:10   DG Chest Port 1 View  Result Date: 02/06/2022 CLINICAL DATA:  Post intubation EXAM: PORTABLE CHEST 1 VIEW COMPARISON:  None. FINDINGS: Endotracheal tube tip is 3.2 cm above the carina. Enteric tube tip in the stomach. Cardiomediastinal silhouette within normal limits. No focal consolidation, pleural effusion or pneumothorax. IMPRESSION: Medical devices as described.  No acute process identified. Electronically Signed   By: Jannifer Hick M.D.   On: 02/01/2022 12:07   US Abdomen Limited RUQ (LIVER/GB)  Result Date: 02/02/2022 CLINICAL DATA:  Alcohol abuse.  Hyperbilirubinemia. EXAM: ULTRASOUND ABDOMEN LIMITED RIGHT UPPER QUADRANT COMPARISON:  None. FINDINGS: Gallbladder: Physiologically distended. No gallstones or wall thickening visualized. Could not assess for sonographic Eulah Pont  sign, patient is intubated. Common bile duct: Diameter: 5 mm, normal. Liver: Heterogeneous increased hepatic parenchymal echogenicity. There is mild capsular nodularity. Difficult to assess for focal liver lesion given the significant heterogeneous appearance of the liver. Portal vein is patent on color Doppler imaging. Flow within the portal vein may to and fro/bidirectional. Other: Varicosities are noted at the hepatic hilum. Moderate volume abdominopelvic ascites, greatest in the lower quadrants. IMPRESSION: 1. Cirrhotic hepatic morphology. Liver parenchyma is diffusely heterogeneous which limits assessment for focal lesion. 2. Bidirectional flow is suspected in the main portal vein with varicosities noted at the hepatic hilum. 3. Moderate abdominopelvic ascites. 4. No gallstones or biliary dilatation. Electronically Signed   By: Narda Rutherford M.D.   On:  02/14/2022 17:28    Labs:  CBC: Recent Labs    02/19/22 0056 02/20/22 0635 01/30/2022 0042 02/20/2022 0942 01/31/2022 1103  WBC 9.8 9.8 12.2*  --  21.6*  HGB 8.4* 8.8* 9.7* 10.2* 9.9*  HCT 26.8* 27.8* 30.8* 30.0* 28.9*  PLT 56* 75* 90*  --  133*    COAGS: Recent Labs    02/18/22 0703 02/19/22 0056 02/20/22 0635 02/16/2022 0042  INR 1.7* 1.9* 1.5* 1.4*  APTT 35 42* 44* 40*    BMP: Recent Labs    02/18/22 0703 02/19/22 0056 02/20/22 0635 02/16/2022 0042 02/04/2022 0942  NA 141 136 132* 130* 130*  K 3.1* 3.4* 4.3 5.5* 4.8  CL 110 104 99 97*  --   CO2 24 25 24 25   --   GLUCOSE 124* 113* 103* 97  --   BUN 17 14 17 18   --   CALCIUM 8.2* 7.8* 8.0* 8.1*  --   CREATININE 1.03 0.88 0.92 0.90  --   GFRNONAA >60 >60 >60 >60  --     LIVER FUNCTION TESTS: Recent Labs    02/17/22 0313 02/19/22 0056 02/20/22 0635 02/20/2022 0042  BILITOT 6.9* 4.5* 5.3* 6.0*  AST 160* 83* 76* 93*  ALT 103* 70* 56* 55*  ALKPHOS 83 95 96 106  PROT 5.2* 4.7* 5.0* 5.6*  ALBUMIN 2.4* 2.0* 2.1* 2.2*    TUMOR MARKERS: No results for input(s): AFPTM, CEA, CA199, CHROMGRNA in the last 8760 hours.  Assessment and Plan: Esophageal varices Patient with recurrent esophageal varices not completely resolved with EGD banding.   IR consulted for possible TIPS procedure.  Dr. 02/06/2022 has reviewed 02/23/22 Abdomen and questions whether the portal vein is thrombosed.  CT BRTO protocol ordered to assess.   Discussed with family at bedside.  They understand that CT is needed to determine whether patient is a candidate for TIPS/BRTO.   Patient is requiring vasopressors and ongoing transfusions.  S/p MTPx1 this afternoon.    Team aware of pending imaging prior to coordination for TIPS/BRTO.   Patient remains intubated and sedated.   MELDNa is 23.  Thank you for this interesting consult.  I greatly enjoyed meeting Deanne Coffer and look forward to participating in their care.  A copy of this report was sent  to the requesting provider on this date.  Electronically Signed: [place], PA 01/31/2022, 1:34 PM   I spent a total of 40 Minutes    in face to face in clinical consultation, greater than 50% of which was counseling/coordinating care for esophageal varices.

## 2022-02-21 NOTE — Progress Notes (Signed)
PCCM progress note  Dr. Carlean Purl with GI was unable to provide any other additional endoscopic treatment for upper GI bleed. Repeat endoscopy 2/27 revealed large greater than 5 mm esophageal varices with history of banding x11 8 days ago.  Exact source of bleeding not seen.  GI recommended emergent consult to interventional radiology for TIPS evaluation.  In the interim patient is becoming more hemodynamically unstable requiring intermittent up titration of vasopressors.  Therefore additional 2 units of PRBC, and additional clotting factors will be administered now.  Central access will also be obtained.  Shatina Streets D. Kenton Kingfisher, NP-C [facility] Personal contact information can be found on Amion  02/06/2022, 11:20 AM

## 2022-02-21 NOTE — Progress Notes (Signed)
Inpatient Rehab Admissions Coordinator:   Note Pt now on pressors, ventilator. CIR will follow from a distance.   Megan Salon, MS, CCC-SLP Rehab Admissions Coordinator  [phone] (celll) [phone] (office)

## 2022-02-21 NOTE — Anesthesia Postprocedure Evaluation (Signed)
Anesthesia Post Note  Patient: Harold Howard  Procedure(s) Performed: IR WITH ANESTHESIA     Patient location during evaluation: SICU Anesthesia Type: General Level of consciousness: sedated Pain management: pain level controlled Vital Signs Assessment: post-procedure vital signs reviewed and stable Respiratory status: patient remains intubated per anesthesia plan Cardiovascular status: stable Postop Assessment: no apparent nausea or vomiting Anesthetic complications: no   No notable events documented.  Last Vitals:  Vitals:   02/07/2022 2150 02/08/2022 2221  BP:    Pulse: 84   Resp: 20   Temp:  (!) 36.4 C  SpO2: 100%     Last Pain:  Vitals:   02/18/2022 2221  TempSrc: Oral  PainSc:                  Tiajuana Amass

## 2022-02-21 NOTE — Progress Notes (Signed)
Patient was transported to IR with RN & 2 CRNA's. Once patient arrived to IR patient was placed on anesthesia's vent by CRNA.

## 2022-02-21 NOTE — Progress Notes (Signed)
Echo attempted. Unable to get in room due to procedure taking place physician stated he was going IR for procedure afterwards.   Will check back in afternoon to see if echo still needs to be completed.

## 2022-02-21 NOTE — Progress Notes (Signed)
RT note: RT and CNRA transported vent patient from IR to 76M 15. Vital signs stable through out.

## 2022-02-21 NOTE — Progress Notes (Signed)
Given worsening shock state with continued bleed decision was made to initiate a massive transfuse protocol.  Dailyn Reith D. Kenton Kingfisher, NP-C [facility] Personal contact information can be found on Amion  02/07/2022, 1:48 PM

## 2022-02-21 NOTE — Progress Notes (Signed)
Progress update:  Over the course of the morning patient progressively uncomfortable, melena. EGD earlier today showed no intervenable varices, massive hematemesis. Discussed with IR and GI - plan for emergent TIPS. Introducer placed in the right IJ. MTP initited since patient progressed into worsening shock and required second vasopressor. Hgb unchanged at 9 after 5 units prbc. Significant other Kelly updated at bedside.   The patient is critically ill due to hemorrhagic shock.  Critical care was necessary to treat or prevent imminent or life-threatening deterioration.  Critical care was time spent personally by me on the following activities: development of treatment plan with patient and/or surrogate as well as nursing, discussions with consultants, evaluation of patient's response to treatment, examination of patient, obtaining history from patient or surrogate, ordering and performing treatments and interventions, ordering and review of laboratory studies, ordering and review of radiographic studies, pulse oximetry, re-evaluation of patient's condition and participation in multidisciplinary rounds.    My personal additional CC time irrespective of procedures 65 minutes.    Lenice Llamas, MD [facility] [place] 02/12/2022 2:12 PM Pager: see AMION  If no response to pager, please call critical care on call (see AMION) until 7pm After 7:00 pm call Elink

## 2022-02-21 NOTE — Procedures (Signed)
°  Procedure: 1. TIPS 2. Embolization of splenic and portal tributaries to varices  3. Paracentesis 3L EBL:   minimal Complications:  none immediate  See full dictation in [employer].  Thora Lance MD Main # [phone] Pager  [phone] Mobile [phone]

## 2022-02-22 ENCOUNTER — Inpatient Hospital Stay (HOSPITAL_COMMUNITY): Payer: Self-pay

## 2022-02-22 ENCOUNTER — Encounter (HOSPITAL_COMMUNITY): Admission: EM | Disposition: E | Payer: Self-pay | Source: Home / Self Care | Attending: Internal Medicine

## 2022-02-22 DIAGNOSIS — I8510 Secondary esophageal varices without bleeding: Secondary | ICD-10-CM

## 2022-02-22 DIAGNOSIS — R578 Other shock: Secondary | ICD-10-CM

## 2022-02-22 DIAGNOSIS — I351 Nonrheumatic aortic (valve) insufficiency: Secondary | ICD-10-CM

## 2022-02-22 DIAGNOSIS — K7031 Alcoholic cirrhosis of liver with ascites: Secondary | ICD-10-CM

## 2022-02-22 DIAGNOSIS — K920 Hematemesis: Secondary | ICD-10-CM

## 2022-02-22 HISTORY — PX: ESOPHAGOGASTRODUODENOSCOPY: SHX5428

## 2022-02-22 LAB — COMPREHENSIVE METABOLIC PANEL
ALT: 21 U/L (ref 0–44)
ALT: 16 U/L (ref 0–44)
ALT: 21 U/L (ref 0–44)
AST: 41 U/L (ref 15–41)
AST: 32 U/L (ref 15–41)
AST: 45 U/L — ABNORMAL HIGH (ref 15–41)
Albumin: 1.9 g/dL — ABNORMAL LOW (ref 3.5–5.0)
Albumin: 2 g/dL — ABNORMAL LOW (ref 3.5–5.0)
Albumin: 2 g/dL — ABNORMAL LOW (ref 3.5–5.0)
Alkaline Phosphatase: 39 U/L (ref 38–126)
Alkaline Phosphatase: 30 U/L — ABNORMAL LOW (ref 38–126)
Alkaline Phosphatase: 35 U/L — ABNORMAL LOW (ref 38–126)
Anion gap: 8 (ref 5–15)
Anion gap: 9 (ref 5–15)
Anion gap: 6 (ref 5–15)
BUN: 17 mg/dL (ref 6–20)
BUN: 17 mg/dL (ref 6–20)
BUN: 18 mg/dL (ref 6–20)
CO2: 23 mmol/L (ref 22–32)
CO2: 23 mmol/L (ref 22–32)
CO2: 25 mmol/L (ref 22–32)
Calcium: 7.5 mg/dL — ABNORMAL LOW (ref 8.9–10.3)
Calcium: 7.2 mg/dL — ABNORMAL LOW (ref 8.9–10.3)
Calcium: 8.8 mg/dL — ABNORMAL LOW (ref 8.9–10.3)
Chloride: 105 mmol/L (ref 98–111)
Chloride: 105 mmol/L (ref 98–111)
Chloride: 107 mmol/L (ref 98–111)
Creatinine, Ser: 0.87 mg/dL (ref 0.61–1.24)
Creatinine, Ser: 0.84 mg/dL (ref 0.61–1.24)
Creatinine, Ser: 0.83 mg/dL (ref 0.61–1.24)
GFR, Estimated: >60 mL/min (ref >60)
GFR, Estimated: >60 mL/min (ref >60)
GFR, Estimated: >60 mL/min (ref >60)
Glucose, Bld: 154 mg/dL — ABNORMAL HIGH (ref 70–99)
Glucose, Bld: 163 mg/dL — ABNORMAL HIGH (ref 70–99)
Glucose, Bld: 136 mg/dL — ABNORMAL HIGH (ref 70–99)
Potassium: 3.9 mmol/L (ref 3.5–5.1)
Potassium: 3.8 mmol/L (ref 3.5–5.1)
Potassium: 3.8 mmol/L (ref 3.5–5.1)
Sodium: 136 mmol/L (ref 135–145)
Sodium: 137 mmol/L (ref 135–145)
Sodium: 138 mmol/L (ref 135–145)
Total Bilirubin: 4.9 mg/dL — ABNORMAL HIGH (ref 0.3–1.2)
Total Bilirubin: 4.5 mg/dL — ABNORMAL HIGH (ref 0.3–1.2)
Total Bilirubin: 7.2 mg/dL — ABNORMAL HIGH (ref 0.3–1.2)
Total Protein: 3.4 g/dL — ABNORMAL LOW (ref 6.5–8.1)
Total Protein: 3.1 g/dL — ABNORMAL LOW (ref 6.5–8.1)
Total Protein: 3.4 g/dL — ABNORMAL LOW (ref 6.5–8.1)

## 2022-02-22 LAB — ECHOCARDIOGRAM COMPLETE
AR max vel: 3.21 cm2
AV Area VTI: 3.31 cm2
AV Area mean vel: 3.45 cm2
AV Mean grad: 4 mmHg
AV Peak grad: 8.1 mmHg
Ao pk vel: 1.42 m/s
Area-P 1/2: 4.8 cm2
Calc EF: 71.2 %
Height: 69 in
P 1/2 time: 558 msec
S' Lateral: 2.8 cm
Single Plane A2C EF: 72.8 %
Single Plane A4C EF: 73.6 %
Weight: 3657.87 oz

## 2022-02-22 LAB — PREPARE CRYOPRECIPITATE
Unit division: 0
Unit division: 0

## 2022-02-22 LAB — BPAM PLATELET PHERESIS
Blood Product Expiration Date: 202302272359
Blood Product Expiration Date: 202303022359
Blood Product Expiration Date: 202303012359
ISSUE DATE / TIME: 202302271100
ISSUE DATE / TIME: 202302271621
ISSUE DATE / TIME: 202302272008
Unit Type and Rh: 6200
Unit Type and Rh: 6200
Unit Type and Rh: 6200

## 2022-02-22 LAB — POCT I-STAT 7, (LYTES, BLD GAS, ICA,H+H)
Acid-Base Excess: 1 mmol/L (ref 0.0–2.0)
Acid-Base Excess: 0 mmol/L (ref 0.0–2.0)
Acid-Base Excess: 1 mmol/L (ref 0.0–2.0)
Acid-base deficit: 3 mmol/L — ABNORMAL HIGH (ref 0.0–2.0)
Bicarbonate: 22.2 mmol/L (ref 20.0–28.0)
Bicarbonate: 25.9 mmol/L (ref 20.0–28.0)
Bicarbonate: 24.2 mmol/L (ref 20.0–28.0)
Bicarbonate: 25.4 mmol/L (ref 20.0–28.0)
Calcium, Ion: 1.16 mmol/L (ref 1.15–1.40)
Calcium, Ion: 0.93 mmol/L — ABNORMAL LOW (ref 1.15–1.40)
Calcium, Ion: 0.86 mmol/L — CL (ref 1.15–1.40)
Calcium, Ion: 1.02 mmol/L — ABNORMAL LOW (ref 1.15–1.40)
HCT: 17 % — ABNORMAL LOW (ref 39.0–52.0)
HCT: 16 % — ABNORMAL LOW (ref 39.0–52.0)
HCT: 20 % — ABNORMAL LOW (ref 39.0–52.0)
HCT: 23 % — ABNORMAL LOW (ref 39.0–52.0)
Hemoglobin: 5.8 g/dL — CL (ref 13.0–17.0)
Hemoglobin: 5.4 g/dL — CL (ref 13.0–17.0)
Hemoglobin: 6.8 g/dL — CL (ref 13.0–17.0)
Hemoglobin: 7.8 g/dL — ABNORMAL LOW (ref 13.0–17.0)
O2 Saturation: 100 %
O2 Saturation: 100 %
O2 Saturation: 100 %
O2 Saturation: 100 %
Patient temperature: 36
Patient temperature: 35.7
Potassium: 4 mmol/L (ref 3.5–5.1)
Potassium: 4 mmol/L (ref 3.5–5.1)
Potassium: 4.2 mmol/L (ref 3.5–5.1)
Potassium: 4.1 mmol/L (ref 3.5–5.1)
Sodium: 136 mmol/L (ref 135–145)
Sodium: 137 mmol/L (ref 135–145)
Sodium: 136 mmol/L (ref 135–145)
Sodium: 136 mmol/L (ref 135–145)
TCO2: 23 mmol/L (ref 22–32)
TCO2: 27 mmol/L (ref 22–32)
TCO2: 25 mmol/L (ref 22–32)
TCO2: 27 mmol/L (ref 22–32)
pCO2 arterial: 39.2 mmHg (ref 32–48)
pCO2 arterial: 44.9 mmHg (ref 32–48)
pCO2 arterial: 36.3 mmHg (ref 32–48)
pCO2 arterial: 36 mmHg (ref 32–48)
pH, Arterial: 7.356 (ref 7.35–7.45)
pH, Arterial: 7.369 (ref 7.35–7.45)
pH, Arterial: 7.433 (ref 7.35–7.45)
pH, Arterial: 7.452 — ABNORMAL HIGH (ref 7.35–7.45)
pO2, Arterial: 243 mmHg — ABNORMAL HIGH (ref 83–108)
pO2, Arterial: 261 mmHg — ABNORMAL HIGH (ref 83–108)
pO2, Arterial: 178 mmHg — ABNORMAL HIGH (ref 83–108)
pO2, Arterial: 185 mmHg — ABNORMAL HIGH (ref 83–108)

## 2022-02-22 LAB — CBC
HCT: 22.7 % — ABNORMAL LOW (ref 39.0–52.0)
HCT: 22.9 % — ABNORMAL LOW (ref 39.0–52.0)
HCT: 30.2 % — ABNORMAL LOW (ref 39.0–52.0)
Hemoglobin: 7.9 g/dL — ABNORMAL LOW (ref 13.0–17.0)
Hemoglobin: 8 g/dL — ABNORMAL LOW (ref 13.0–17.0)
Hemoglobin: 11.1 g/dL — ABNORMAL LOW (ref 13.0–17.0)
MCH: 29.9 pg (ref 26.0–34.0)
MCH: 30.8 pg (ref 26.0–34.0)
MCH: 31 pg (ref 26.0–34.0)
MCHC: 34.8 g/dL (ref 30.0–36.0)
MCHC: 34.9 g/dL (ref 30.0–36.0)
MCHC: 36.8 g/dL — ABNORMAL HIGH (ref 30.0–36.0)
MCV: 86 fL (ref 80.0–100.0)
MCV: 88.1 fL (ref 80.0–100.0)
MCV: 84.4 fL (ref 80.0–100.0)
Platelets: 30 10*3/uL — ABNORMAL LOW (ref 150–400)
Platelets: 76 10*3/uL — ABNORMAL LOW (ref 150–400)
Platelets: 58 10*3/uL — ABNORMAL LOW (ref 150–400)
RBC: 2.64 MIL/uL — ABNORMAL LOW (ref 4.22–5.81)
RBC: 2.6 MIL/uL — ABNORMAL LOW (ref 4.22–5.81)
RBC: 3.58 MIL/uL — ABNORMAL LOW (ref 4.22–5.81)
RDW: 14.6 % (ref 11.5–15.5)
RDW: 14.7 % (ref 11.5–15.5)
RDW: 14.6 % (ref 11.5–15.5)
WBC: 11.8 10*3/uL — ABNORMAL HIGH (ref 4.0–10.5)
WBC: 11.6 10*3/uL — ABNORMAL HIGH (ref 4.0–10.5)
WBC: 12.1 10*3/uL — ABNORMAL HIGH (ref 4.0–10.5)
nRBC: 0 % (ref 0.0–0.2)
nRBC: 0 % (ref 0.0–0.2)
nRBC: 0 % (ref 0.0–0.2)

## 2022-02-22 LAB — GLOBAL TEG PANEL
CFF Max Amplitude: 11.9 mm — ABNORMAL LOW (ref 15–32)
CK with Heparinase (R): 8.2 min (ref 4.3–8.3)
Citrated Functional Fibrinogen: 217.2 mg/dL — ABNORMAL LOW (ref 278–581)
Citrated Kaolin (K): 3.4 min — ABNORMAL HIGH (ref 0.8–2.1)
Citrated Kaolin (MA): 41 mm — ABNORMAL LOW (ref 52–69)
Citrated Kaolin (R): 8.6 min (ref 4.6–9.1)
Citrated Kaolin Angle: 58.9 deg — ABNORMAL LOW (ref 63–78)
Citrated Rapid TEG (MA): <40 mm — ABNORMAL LOW (ref 52–70)

## 2022-02-22 LAB — PREPARE PLATELET PHERESIS
Unit division: 0
Unit division: 0
Unit division: 0

## 2022-02-22 LAB — GLUCOSE, CAPILLARY
Glucose-Capillary: 154 mg/dL — ABNORMAL HIGH (ref 70–99)
Glucose-Capillary: 128 mg/dL — ABNORMAL HIGH (ref 70–99)
Glucose-Capillary: 117 mg/dL — ABNORMAL HIGH (ref 70–99)
Glucose-Capillary: 113 mg/dL — ABNORMAL HIGH (ref 70–99)
Glucose-Capillary: 105 mg/dL — ABNORMAL HIGH (ref 70–99)

## 2022-02-22 LAB — BPAM CRYOPRECIPITATE
Blood Product Expiration Date: 202302200343
Blood Product Expiration Date: 202302272331
ISSUE DATE / TIME: 202302192232
ISSUE DATE / TIME: 202302271811
Unit Type and Rh: 5100
Unit Type and Rh: 5100

## 2022-02-22 LAB — DIC (DISSEMINATED INTRAVASCULAR COAGULATION)PANEL
D-Dimer, Quant: 6.28 ug/mL-FEU — ABNORMAL HIGH (ref 0.00–0.50)
Fibrinogen: 204 mg/dL — ABNORMAL LOW (ref 210–475)
INR: 1.8 — ABNORMAL HIGH (ref 0.8–1.2)
Platelets: 30 10*3/uL — ABNORMAL LOW (ref 150–400)
Prothrombin Time: 20.8 seconds — ABNORMAL HIGH (ref 11.4–15.2)
Smear Review: NONE SEEN
aPTT: 52 seconds — ABNORMAL HIGH (ref 24–36)

## 2022-02-22 LAB — PREPARE RBC (CROSSMATCH)

## 2022-02-22 LAB — PROTIME-INR
INR: 1.3 — ABNORMAL HIGH (ref 0.8–1.2)
Prothrombin Time: 16.5 seconds — ABNORMAL HIGH (ref 11.4–15.2)

## 2022-02-22 LAB — FIBRINOGEN
Fibrinogen: 190 mg/dL — ABNORMAL LOW (ref 210–475)
Fibrinogen: 197 mg/dL — ABNORMAL LOW (ref 210–475)

## 2022-02-22 LAB — APTT: aPTT: 42 seconds — ABNORMAL HIGH (ref 24–36)

## 2022-02-22 SURGERY — EGD (ESOPHAGOGASTRODUODENOSCOPY)
Anesthesia: Moderate Sedation

## 2022-02-22 MED ORDER — FOLIC ACID 1 MG PO TABS: 1.0000 mg | ORAL_TABLET | Freq: Every day | ORAL | Status: DC

## 2022-02-22 MED ORDER — SODIUM CHLORIDE 0.9 % IV SOLN
2.0000 g | Freq: Once | INTRAVENOUS | Status: AC
  Administered 2022-02-22: 2 g via INTRAVENOUS
  Filled 2022-02-22: qty 20

## 2022-02-22 MED ORDER — SODIUM CHLORIDE 0.9% IV SOLUTION: Freq: Once | INTRAVENOUS | Status: AC

## 2022-02-22 MED ORDER — ADULT MULTIVITAMIN W/MINERALS CH: 1.0000 | ORAL_TABLET | Freq: Every day | ORAL | Status: DC

## 2022-02-22 MED ORDER — "THROMBI-PAD 3""X3"" EX PADS"
1.0000 | MEDICATED_PAD | Freq: Once | CUTANEOUS | Status: AC
  Administered 2022-02-22: 1 via TOPICAL
  Filled 2022-02-22: qty 1

## 2022-02-22 MED ORDER — CALCIUM CHLORIDE 10 % IV SOLN
1.0000 g | Freq: Once | INTRAVENOUS | Status: AC
  Administered 2022-02-22: 1 g via INTRAVENOUS
  Filled 2022-02-22: qty 10

## 2022-02-22 MED ORDER — LACTULOSE 10 GM/15ML PO SOLN
20.0000 g | Freq: Two times a day (BID) | ORAL | Status: DC
  Filled 2022-02-22: qty 30

## 2022-02-22 MED ORDER — VITAMIN K1 10 MG/ML IJ SOLN
5.0000 mg | Freq: Once | INTRAVENOUS | Status: AC
  Administered 2022-02-22: 5 mg via INTRAVENOUS
  Filled 2022-02-22: qty 0.5

## 2022-02-22 MED ORDER — ALBUMIN HUMAN 5 % IV SOLN
25.0000 g | Freq: Once | INTRAVENOUS | Status: AC
  Administered 2022-02-22: 25 g via INTRAVENOUS
  Filled 2022-02-22: qty 500

## 2022-02-22 MED ORDER — POLYSACCHARIDE IRON COMPLEX 150 MG PO CAPS
150.0000 mg | ORAL_CAPSULE | Freq: Every day | ORAL | Status: DC
  Filled 2022-02-22: qty 1

## 2022-02-22 MED ORDER — RIFAXIMIN 550 MG PO TABS
550.0000 mg | ORAL_TABLET | Freq: Two times a day (BID) | ORAL | Status: DC
  Filled 2022-02-22: qty 1

## 2022-02-22 MED ORDER — EPINEPHRINE 1 MG/10ML IJ SOSY
PREFILLED_SYRINGE | INTRAMUSCULAR | Status: AC
  Filled 2022-02-22: qty 10

## 2022-02-22 MED ORDER — THIAMINE HCL 100 MG PO TABS: 100.0000 mg | ORAL_TABLET | Freq: Every day | ORAL | Status: DC

## 2022-02-22 MED ORDER — CALCIUM CHLORIDE 10 % IV SOLN
2.0000 g | Freq: Once | INTRAVENOUS | Status: DC
Start: 2022-02-22 — End: 2022-02-22

## 2022-02-22 MED ORDER — PROTHROMBIN COMPLEX CONC HUMAN 500 UNITS IV KIT
4421.0000 [IU] | PACK | Status: AC
  Administered 2022-02-22: 4421 [IU] via INTRAVENOUS
  Filled 2022-02-22: qty 4421

## 2022-02-22 MED ORDER — MIDODRINE HCL 5 MG PO TABS: 5.0000 mg | ORAL_TABLET | Freq: Three times a day (TID) | ORAL | Status: DC

## 2022-02-22 MED ORDER — SODIUM CHLORIDE 0.9 % IV SOLN
0.3000 ug/kg | Freq: Once | INTRAVENOUS | Status: AC
  Administered 2022-02-22: 27.6 ug via INTRAVENOUS
  Filled 2022-02-22: qty 6

## 2022-02-22 NOTE — Progress Notes (Addendum)
Daily Rounding Note  02/03/2022, 9:40 AM  LOS: 9 days   SUBJECTIVE:   Chief complaint:      Dr. Havery Moros came in and did an EGD around 6 AM this morning for recurrent hematemesis following TIPS procedure.  Findings of blood and clot in the esophagus with poor visualization no focal findings amenable to endoscopic therapy.  Spent several minutes trying to clear the blood.  Varices were decompressed.  No active bleeding from post banding ulcers.  Clot burden, blood in stomach prohibited visualization. Patient is now DNR. On fentanyl, Versed, Levophed, octreotide, Protonix drips Current, latest BP 97/66 with pulse 71.  OBJECTIVE:         Vital signs in last 24 hours:    Temp:  [94 F (34.4 C)-98.7 F (37.1 C)] 96.6 F (35.9 C) (02/28 0800) Pulse Rate:  [65-111] 71 (02/28 0932) Resp:  [11-27] 20 (02/28 0932) BP: (64-156)/(43-115) 97/66 (02/28 0900) SpO2:  [94 %-100 %] 100 % (02/28 0932) Arterial Line BP: (102-164)/(46-78) 110/49 (02/28 0932) FiO2 (%):  [30 %-100 %] 60 % (02/28 0712) Weight:  [103.7 kg] 103.7 kg (02/28 0441) Last BM Date : 02/15/2022 Filed Weights   02/20/22 0500 02/20/2022 0500 02/15/2022 0441  Weight: 93.3 kg 92.5 kg 103.7 kg   Pt not re-examined.    Intake/Output from previous day: 02/27 0701 - 02/28 0700 In: 18750.5 [I.V.:7115.3; Blood:10625.5; IV Piggyback:1009.7] Out: 9025 [Urine:1525; Stool:200; Blood:3200]  Intake/Output this shift: Total I/O In: 315 [Blood:315] Out: -   Lab Results: Recent Labs    02/15/2022 2145 02/02/2022 2243 02/20/2022 0118 01/28/2022 0327  WBC 10.4  --  11.8* 11.6*  HGB 9.8* 8.5* 7.9* 8.0*  HCT 27.8* 25.0* 22.7* 22.9*  PLT 31*  --  30*   30* 76*   BMET Recent Labs    01/27/2022 2145 02/05/2022 2146 02/20/2022 2243 02/03/2022 0118  NA 136 136 137 137  K 3.8 3.9 3.9 3.8  CL 105 105  --  105  CO2 23 23  --  23  GLUCOSE 154* 154*  --  163*  BUN 17 17  --  17  CREATININE  0.86 0.87  --  0.84  CALCIUM 7.4* 7.5*  --  7.2*   LFT Recent Labs    02/05/2022 0042 02/20/2022 2146 02/05/2022 0118  PROT 5.6* 3.4* 3.1*  ALBUMIN 2.2* 1.9* 2.0*  AST 93* 41 32  ALT 55* 21 16  ALKPHOS 106 39 30*  BILITOT 6.0* 4.9* 4.5*   PT/INR Recent Labs    01/28/2022 2145 02/09/2022 0118  LABPROT 18.5* 20.8*  INR 1.5* 1.8*   Hepatitis Panel No results for input(s): HEPBSAG, HCVAB, HEPAIGM, HEPBIGM in the last 72 hours.  Studies/Results: IR Tips  Result Date: 02/12/2022 CLINICAL DATA:  Cirrhosis, portal venous hypertension, GI bleed with esophageal varices post banding, incompletely controlled. Hemodynamic instability. EXAM: 1. TIPS CREATION 2. COIL EMBOLIZATION OF VENOUS VARICES 3. ULTRASOUND-GUIDED VENOUS ACCESS 4. PARACENTESIS WITH ULTRASOUND GUIDANCE ANESTHESIA/SEDATION: General - as administered by the Anesthesia department MEDICATIONS: Per anesthesia department FLUOROSCOPY TIME:  Radiation Exposure Index (as provided by the fluoroscopic device): 5009 mGy Kerma COMPLICATIONS: None immediate. PROCEDURE: Informed written consent was obtained from the family after a thorough discussion of the procedural risks, benefits and alternatives. All questions were addressed. See previous consultation. Maximal Sterile Barrier Technique was utilized including caps, mask, sterile gowns, sterile gloves, sterile drape, hand hygiene and skin antiseptic. A timeout was performed prior to the  initiation of the procedure. The skin overlying the right upper abdominal quadrant as well as the right neck and femoral region were prepped and draped in usual sterile fashion. Initial ultrasound scanning demonstrates a moderate amount of recurrent  ascites. Under ultrasound guidance, a 6 [place] Safe-T-Centesis needle was advanced into the peritoneal cavity from a right lateral approach for paracentesis removing 3 L clear yellow ascites. Under direct ultrasound guidance, patency of the right common femoral vein was  identified and documented. Micropuncture access under ultrasound guidance was achieved. Dilator exchanged over guidewire for 8 French vascular sheath, through which the ICE ultrasound probe was advanced into the central aspect of the IVC. Next, the right internal jugular vein patency was confirmed and documented with ultrasound, then the vein was accessed under direct ultrasound guidance with micropuncture set. This allowed for placement of the 10 French TIPS vascular sheath. 5 French angled angiographic catheter was advanced initially to the right hepatic vein and then into the middle hepatic vein under fluoroscopic and ultrasound guidance. Catheter exchanged over guidewire for a ScorpionT TIPS access set, advanced into the central aspect of right portal vein. Angled Glidewire was advanced into the portal vein. The access set was advanced sequentially into the portal vein. This was exchanged over guidewire for a pigtail measuring catheter. Portal venogram was performed demonstrating patency, slow antegrade flow through the main and right portal vein with limited opacification of the left portal vein. Large collaterals to esophageal varices were identified from the proximal portal vein. Pigtail catheter exchanged over guidewire. Tract was dilated with Conquest angioplasty balloon. The 10 French sheath was advanced into the portal vein. Next, a 2 cm (un covered) x 8 cm (covered) x 10 mm diameter GORE VIATORR TIPS Endoprosthesis was advanced through the sheath across the intra hepatic track and deployed. The TIPS stent was angioplastied in multiple stations to 10 mm diameter. Follow-up portal venogram demonstrated good stent deployment and flow, no residual stenosis. No further antegrade flow into the right left portal vein branches evident. Persistent opacification of large collateral channel supplying the esophageal varices. Attention then turned to variceal embolization. Dominant varices from the proximal portal  vein and mid splenic vein were occluded with a total of 16 interlock and Ruby coils, to virtual cessation of flow through the channels. Final venogram from the peripheral splenic vein shows excellent antegrade flow through splenic and portal veins, no extravasation or other apparent complication. Catheters and sheaths were removed from the patient.Hemostasis was achieved at the right femoral access site with manual compression. Hemostasis was achieved at the right IJ access site with manual compression. A dressing was placed. The patient tolerated the procedure well. Patient transferred to the PACU. IMPRESSION: 1. Technically successful TIPS creation. 2. Coil embolization of dominant splenic and portal collateral venous channels supplying esophageal varices. 3. Ultrasound guided paracentesis removing 3 L clear yellow ascites. Electronically Signed   By: Lucrezia Europe M.D.   On: 02/15/2022 09:07   DG CHEST PORT 1 VIEW  Result Date: 02/08/2022 CLINICAL DATA:  Central line placement. EXAM: PORTABLE CHEST 1 VIEW COMPARISON:  02/08/2022 FINDINGS: 0607 hours. Low volume film. The cardio pericardial silhouette is enlarged. Bibasilar atelectasis/infiltrate noted. Endotracheal tube tip is 2.8 cm above the base of the carina. Right IJ central line tip overlies the right lung apex. No evidence for pneumothorax. Telemetry leads overlie the chest. IMPRESSION: 1. Right IJ central line tip overlies the right lung apex. No pneumothorax. 2. Bibasilar atelectasis/infiltrate. Electronically Signed   By: Randall Hiss  Tery Sanfilippo M.D.   On: 02/01/2022 07:16   DG CHEST PORT 1 VIEW  Result Date: 02/10/2022 CLINICAL DATA:  Central line placement. EXAM: PORTABLE CHEST 1 VIEW COMPARISON:  02/14/2022 FINDINGS: 1148 hours. Endotracheal tube tip is 4.7 cm above the base of the carina. Right IJ sheath noted with tip overlying the region of the innominate vein confluence. No evidence for pneumothorax. No pulmonary edema or pleural effusion. Probable  small hiatal hernia. The cardiopericardial silhouette is within normal limits for size. Telemetry leads overlie the chest. IMPRESSION: 1. Endotracheal tube tip 4.7 cm above the base of the carina. 2. Right IJ sheath with tip overlying the region of the innominate vein confluence. 3. No pulmonary edema or focal consolidation. Electronically Signed   By: Misty Stanley M.D.   On: 02/08/2022 12:00   IR IVUS EACH ADDITIONAL NON CORONARY VESSEL  Result Date: 02/16/2022 CLINICAL DATA:  Cirrhosis, portal venous hypertension, GI bleed with esophageal varices post banding, incompletely controlled. Hemodynamic instability. EXAM: 1. TIPS CREATION 2. COIL EMBOLIZATION OF VENOUS VARICES 3. ULTRASOUND-GUIDED VENOUS ACCESS 4. PARACENTESIS WITH ULTRASOUND GUIDANCE ANESTHESIA/SEDATION: General - as administered by the Anesthesia department MEDICATIONS: Per anesthesia department FLUOROSCOPY TIME:  Radiation Exposure Index (as provided by the fluoroscopic device): 3664 mGy Kerma COMPLICATIONS: None immediate. PROCEDURE: Informed written consent was obtained from the family after a thorough discussion of the procedural risks, benefits and alternatives. All questions were addressed. See previous consultation. Maximal Sterile Barrier Technique was utilized including caps, mask, sterile gowns, sterile gloves, sterile drape, hand hygiene and skin antiseptic. A timeout was performed prior to the initiation of the procedure. The skin overlying the right upper abdominal quadrant as well as the right neck and femoral region were prepped and draped in usual sterile fashion. Initial ultrasound scanning demonstrates a moderate amount of recurrent  ascites. Under ultrasound guidance, a 6 [place] Safe-T-Centesis needle was advanced into the peritoneal cavity from a right lateral approach for paracentesis removing 3 L clear yellow ascites. Under direct ultrasound guidance, patency of the right common femoral vein was identified and documented.  Micropuncture access under ultrasound guidance was achieved. Dilator exchanged over guidewire for 8 French vascular sheath, through which the ICE ultrasound probe was advanced into the central aspect of the IVC. Next, the right internal jugular vein patency was confirmed and documented with ultrasound, then the vein was accessed under direct ultrasound guidance with micropuncture set. This allowed for placement of the 10 French TIPS vascular sheath. 5 French angled angiographic catheter was advanced initially to the right hepatic vein and then into the middle hepatic vein under fluoroscopic and ultrasound guidance. Catheter exchanged over guidewire for a ScorpionT TIPS access set, advanced into the central aspect of right portal vein. Angled Glidewire was advanced into the portal vein. The access set was advanced sequentially into the portal vein. This was exchanged over guidewire for a pigtail measuring catheter. Portal venogram was performed demonstrating patency, slow antegrade flow through the main and right portal vein with limited opacification of the left portal vein. Large collaterals to esophageal varices were identified from the proximal portal vein. Pigtail catheter exchanged over guidewire. Tract was dilated with Conquest angioplasty balloon. The 10 French sheath was advanced into the portal vein. Next, a 2 cm (un covered) x 8 cm (covered) x 10 mm diameter GORE VIATORR TIPS Endoprosthesis was advanced through the sheath across the intra hepatic track and deployed. The TIPS stent was angioplastied in multiple stations to 10 mm diameter. Follow-up portal venogram demonstrated good  stent deployment and flow, no residual stenosis. No further antegrade flow into the right left portal vein branches evident. Persistent opacification of large collateral channel supplying the esophageal varices. Attention then turned to variceal embolization. Dominant varices from the proximal portal vein and mid splenic vein  were occluded with a total of 16 interlock and Ruby coils, to virtual cessation of flow through the channels. Final venogram from the peripheral splenic vein shows excellent antegrade flow through splenic and portal veins, no extravasation or other apparent complication. Catheters and sheaths were removed from the patient.Hemostasis was achieved at the right femoral access site with manual compression. Hemostasis was achieved at the right IJ access site with manual compression. A dressing was placed. The patient tolerated the procedure well. Patient transferred to the PACU. IMPRESSION: 1. Technically successful TIPS creation. 2. Coil embolization of dominant splenic and portal collateral venous channels supplying esophageal varices. 3. Ultrasound guided paracentesis removing 3 L clear yellow ascites. Electronically Signed   By: Lucrezia Europe M.D.   On: 02/08/2022 09:07   IR EMBO VENOUS NOT HEMORR HEMANG  INC GUIDE ROADMAPPING  Result Date: 02/03/2022 CLINICAL DATA:  Cirrhosis, portal venous hypertension, GI bleed with esophageal varices post banding, incompletely controlled. Hemodynamic instability. EXAM: 1. TIPS CREATION 2. COIL EMBOLIZATION OF VENOUS VARICES 3. ULTRASOUND-GUIDED VENOUS ACCESS 4. PARACENTESIS WITH ULTRASOUND GUIDANCE ANESTHESIA/SEDATION: General - as administered by the Anesthesia department MEDICATIONS: Per anesthesia department FLUOROSCOPY TIME:  Radiation Exposure Index (as provided by the fluoroscopic device): 0947 mGy Kerma COMPLICATIONS: None immediate. PROCEDURE: Informed written consent was obtained from the family after a thorough discussion of the procedural risks, benefits and alternatives. All questions were addressed. See previous consultation. Maximal Sterile Barrier Technique was utilized including caps, mask, sterile gowns, sterile gloves, sterile drape, hand hygiene and skin antiseptic. A timeout was performed prior to the initiation of the procedure. The skin overlying the right  upper abdominal quadrant as well as the right neck and femoral region were prepped and draped in usual sterile fashion. Initial ultrasound scanning demonstrates a moderate amount of recurrent  ascites. Under ultrasound guidance, a 6 [place] Safe-T-Centesis needle was advanced into the peritoneal cavity from a right lateral approach for paracentesis removing 3 L clear yellow ascites. Under direct ultrasound guidance, patency of the right common femoral vein was identified and documented. Micropuncture access under ultrasound guidance was achieved. Dilator exchanged over guidewire for 8 French vascular sheath, through which the ICE ultrasound probe was advanced into the central aspect of the IVC. Next, the right internal jugular vein patency was confirmed and documented with ultrasound, then the vein was accessed under direct ultrasound guidance with micropuncture set. This allowed for placement of the 10 French TIPS vascular sheath. 5 French angled angiographic catheter was advanced initially to the right hepatic vein and then into the middle hepatic vein under fluoroscopic and ultrasound guidance. Catheter exchanged over guidewire for a ScorpionT TIPS access set, advanced into the central aspect of right portal vein. Angled Glidewire was advanced into the portal vein. The access set was advanced sequentially into the portal vein. This was exchanged over guidewire for a pigtail measuring catheter. Portal venogram was performed demonstrating patency, slow antegrade flow through the main and right portal vein with limited opacification of the left portal vein. Large collaterals to esophageal varices were identified from the proximal portal vein. Pigtail catheter exchanged over guidewire. Tract was dilated with Conquest angioplasty balloon. The 10 French sheath was advanced into the portal vein. Next, a 2 cm (  un covered) x 8 cm (covered) x 10 mm diameter GORE VIATORR TIPS Endoprosthesis was advanced through the sheath  across the intra hepatic track and deployed. The TIPS stent was angioplastied in multiple stations to 10 mm diameter. Follow-up portal venogram demonstrated good stent deployment and flow, no residual stenosis. No further antegrade flow into the right left portal vein branches evident. Persistent opacification of large collateral channel supplying the esophageal varices. Attention then turned to variceal embolization. Dominant varices from the proximal portal vein and mid splenic vein were occluded with a total of 16 interlock and Ruby coils, to virtual cessation of flow through the channels. Final venogram from the peripheral splenic vein shows excellent antegrade flow through splenic and portal veins, no extravasation or other apparent complication. Catheters and sheaths were removed from the patient.Hemostasis was achieved at the right femoral access site with manual compression. Hemostasis was achieved at the right IJ access site with manual compression. A dressing was placed. The patient tolerated the procedure well. Patient transferred to the PACU. IMPRESSION: 1. Technically successful TIPS creation. 2. Coil embolization of dominant splenic and portal collateral venous channels supplying esophageal varices. 3. Ultrasound guided paracentesis removing 3 L clear yellow ascites. Electronically Signed   By: Lucrezia Europe M.D.   On: 02/19/2022 09:07   CT Angio Abd/Pel w/ and/or w/o  Result Date: 02/20/2022 CLINICAL DATA:  GI bleed. EXAM: CTA ABDOMEN AND PELVIS WITHOUT AND WITH CONTRAST TECHNIQUE: Multidetector CT imaging of the abdomen and pelvis was performed using the standard protocol during bolus administration of intravenous contrast. Multiplanar reconstructed images and MIPs were obtained and reviewed to evaluate the vascular anatomy. RADIATION DOSE REDUCTION: This exam was performed according to the departmental dose-optimization program which includes automated exposure control, adjustment of the mA and/or  kV according to patient size and/or use of iterative reconstruction technique. CONTRAST:  115m OMNIPAQUE IOHEXOL 350 MG/ML SOLN COMPARISON:  None. FINDINGS: VASCULAR Aorta: Age advanced atherosclerotic changes involving the aorta but no aneurysm dissection. Celiac: Of separate origins of the hepatic and splenic arteries. Both vessels are patent. SMA: Normal Renals: Normal IMA: Patent Inflow: Age advanced atherosclerotic calcifications but no aneurysm or dissection or occlusion. Proximal Outflow: Patent Veins: Unremarkable. Review of the MIP images confirms the above findings. NON-VASCULAR Lower chest: Bilateral pleural effusions and overlying atelectasis. The heart is within normal limits in size. Moderate-sized hiatal hernia. Paraesophageal varices. Hepatobiliary: Cirrhotic changes involving the liver but no arterial phase enhancing lesions to suggest hepatoma. No intrahepatic biliary dilatation. The gallbladder is mildly distended and contains some layering sludge or gallstones. No common bile duct dilatation. Pancreas: No mass, inflammation or ductal dilatation. Spleen: Normal size. No focal lesions. The splenic vein is patent. Small focus of thrombus is noted in the portal vein but no occlusive clot. Adrenals/Urinary Tract: The adrenal glands are unremarkable. Left renal calculus and small scattered renal cysts but no worrisome renal lesions or hydronephrosis. The bladder contains a Foley catheter. Stomach/Bowel: The stomach is moderately distended. Do not see any evidence of active contrast extravasation to suggest hemorrhage. The duodenum, small bowel and colon are grossly normal. No evidence of active extravasation to account for an active GI bleed. No obstructive findings. Lymphatic: Upper abdominal lymph nodes typical with cirrhosis. No retroperitoneal mass or adenopathy. Reproductive: The prostate gland and seminal vesicles are unremarkable. Other: Large volume abdominal/pelvic ascites and diffuse and  fairly marked body wall edema. Musculoskeletal: No significant bony findings. IMPRESSION: 1. Age advanced atherosclerotic changes involving the aorta and branch  vessels. No aneurysm or dissection. 2. No evidence of active contrast extravasation to account for an active GI bleed. 3. Advanced cirrhotic changes involving the liver with portal venous hypertension, portal venous collaterals, paraesophageal varices and large volume abdominal/pelvic ascites. 4. No worrisome arterial phase enhancing lesions to suggest hepatoma. 5. Bilateral pleural effusions and overlying atelectasis. 6. Small focus of thrombus in the portal vein but no occlusive clot. Aortic Atherosclerosis (ICD10-I70.0). Electronically Signed   By: Marijo Sanes M.D.   On: 02/02/2022 18:12   IR Paracentesis  Result Date: 02/20/2022 CLINICAL DATA:  Cirrhosis, portal venous hypertension, GI bleed with esophageal varices post banding, incompletely controlled. Hemodynamic instability. EXAM: 1. TIPS CREATION 2. COIL EMBOLIZATION OF VENOUS VARICES 3. ULTRASOUND-GUIDED VENOUS ACCESS 4. PARACENTESIS WITH ULTRASOUND GUIDANCE ANESTHESIA/SEDATION: General - as administered by the Anesthesia department MEDICATIONS: Per anesthesia department FLUOROSCOPY TIME:  Radiation Exposure Index (as provided by the fluoroscopic device): 0923 mGy Kerma COMPLICATIONS: None immediate. PROCEDURE: Informed written consent was obtained from the family after a thorough discussion of the procedural risks, benefits and alternatives. All questions were addressed. See previous consultation. Maximal Sterile Barrier Technique was utilized including caps, mask, sterile gowns, sterile gloves, sterile drape, hand hygiene and skin antiseptic. A timeout was performed prior to the initiation of the procedure. The skin overlying the right upper abdominal quadrant as well as the right neck and femoral region were prepped and draped in usual sterile fashion. Initial ultrasound scanning  demonstrates a moderate amount of recurrent  ascites. Under ultrasound guidance, a 6 [place] Safe-T-Centesis needle was advanced into the peritoneal cavity from a right lateral approach for paracentesis removing 3 L clear yellow ascites. Under direct ultrasound guidance, patency of the right common femoral vein was identified and documented. Micropuncture access under ultrasound guidance was achieved. Dilator exchanged over guidewire for 8 French vascular sheath, through which the ICE ultrasound probe was advanced into the central aspect of the IVC. Next, the right internal jugular vein patency was confirmed and documented with ultrasound, then the vein was accessed under direct ultrasound guidance with micropuncture set. This allowed for placement of the 10 French TIPS vascular sheath. 5 French angled angiographic catheter was advanced initially to the right hepatic vein and then into the middle hepatic vein under fluoroscopic and ultrasound guidance. Catheter exchanged over guidewire for a ScorpionT TIPS access set, advanced into the central aspect of right portal vein. Angled Glidewire was advanced into the portal vein. The access set was advanced sequentially into the portal vein. This was exchanged over guidewire for a pigtail measuring catheter. Portal venogram was performed demonstrating patency, slow antegrade flow through the main and right portal vein with limited opacification of the left portal vein. Large collaterals to esophageal varices were identified from the proximal portal vein. Pigtail catheter exchanged over guidewire. Tract was dilated with Conquest angioplasty balloon. The 10 French sheath was advanced into the portal vein. Next, a 2 cm (un covered) x 8 cm (covered) x 10 mm diameter GORE VIATORR TIPS Endoprosthesis was advanced through the sheath across the intra hepatic track and deployed. The TIPS stent was angioplastied in multiple stations to 10 mm diameter. Follow-up portal venogram  demonstrated good stent deployment and flow, no residual stenosis. No further antegrade flow into the right left portal vein branches evident. Persistent opacification of large collateral channel supplying the esophageal varices. Attention then turned to variceal embolization. Dominant varices from the proximal portal vein and mid splenic vein were occluded with a total of 16 interlock and  Ruby coils, to virtual cessation of flow through the channels. Final venogram from the peripheral splenic vein shows excellent antegrade flow through splenic and portal veins, no extravasation or other apparent complication. Catheters and sheaths were removed from the patient.Hemostasis was achieved at the right femoral access site with manual compression. Hemostasis was achieved at the right IJ access site with manual compression. A dressing was placed. The patient tolerated the procedure well. Patient transferred to the PACU. IMPRESSION: 1. Technically successful TIPS creation. 2. Coil embolization of dominant splenic and portal collateral venous channels supplying esophageal varices. 3. Ultrasound guided paracentesis removing 3 L clear yellow ascites. Electronically Signed   By: Lucrezia Europe M.D.   On: 02/15/2022 09:07    Scheduled Meds:  sodium chloride   Intravenous Once   sodium chloride   Intravenous Once   chlorhexidine gluconate (MEDLINE KIT)  15 mL Mouth Rinse BID   Chlorhexidine Gluconate Cloth  6 each Topical Daily   docusate  100 mg Per Tube BID   feeding supplement  237 mL Oral TID BM   fentaNYL (SUBLIMAZE) injection  50 mcg Intravenous Once   folic acid  1 mg Oral Daily   iron polysaccharides  150 mg Oral Daily   lactulose  20 g Oral BID   mouth rinse  15 mL Mouth Rinse 10 times per day   midodrine  5 mg Oral TID WC   multivitamin with minerals  1 tablet Oral Daily   nicotine  21 mg Transdermal Daily   [START ON 02/24/2022] pantoprazole  40 mg Intravenous Q12H   polyethylene glycol  17 g Per Tube Daily    rifaximin  550 mg Oral BID   sodium chloride flush  10-40 mL Intracatheter Q12H   thiamine  100 mg Oral Daily   Continuous Infusions:  sodium chloride 10 mL/hr at 02/01/2022 0000   sodium chloride 10 mL/hr at 02/14/2022 0919   cefTRIAXone (ROCEPHIN)  IV Stopped (02/14/2022 2259)   dexmedetomidine (PRECEDEX) IV infusion Stopped (02/16/2022 1655)   fentaNYL infusion INTRAVENOUS 200 mcg/hr (01/30/2022 0918)   midazolam 10 mg/hr (02/03/2022 0922)   norepinephrine (LEVOPHED) Adult infusion 2 mcg/min (02/20/2022 4818)   octreotide  (SANDOSTATIN)    IV infusion 50 mcg/hr (02/09/2022 0919)   pantoprazole 8 mg/hr (02/11/2022 0917)   vasopressin 0.03 Units/min (02/05/2022 0916)   PRN Meds:.sodium chloride, albuterol, fentaNYL, lip balm, midazolam, ondansetron (ZOFRAN) IV, sodium chloride flush   ASSESMENT:     Bleeding esoph varices, hematemesis.  1 bands placed 2/19.  EGD # 2 in  AM 2/28 with large esophageal varices, multiple ulcers at site of recent banding.  Some nipples present.  Blood in the lower esophagus and stomach.  Portal hypertensive gastropathy.  2/27 9 PM TIPS and embolization of splenic and portal tributaries 2 varices, 3 L paracentesis.  EGD # _0 AM today With clotted blood in esophagus, no active bleeding from post banding ulcers.  Visualization limited but no source for active bleeding.     Blood loss anemia.  Treated with massive transfusion protocol at admission and again yesterday.  Decompensated cirrhosis with coagulopathy, ascites, HE, thrombocytopenia, varices/GI bleed.   PLAN   Patient now DNR.  GI available if needed but will sign off.    Azucena Freed  02/10/2022, 9:40 AM Phone [phone]   GI Attending:  Will leave on our list in the unlikely event that he survives. Appreciate efforts of IR and PCCM. Gatha Mayer, MD, [facility] [place]  [facility] 02/11/2022 10:01 AM

## 2022-02-22 NOTE — Progress Notes (Addendum)
41 y/o M, severe ETOH abuse history, with esophageal varices, massive GIB, unresponsive to massive transfusion protocol, today went for TIPS/ Embolization of splenic and portal tributaries to varices/ Paracentesis 3L  Persistent rectal bleeding, post procedure Hb 9.0   Plans: - getting 4U PRBC now.  - got 2U cryoppt - Kcentra stat dose now (50U/kg )  - can repeat dose, if no response may consider Tranexamic acid  -stat CaCl, stat DDAVP -4 units apheresis platelets ordered  -check Fibrinogen levels, TEG if available

## 2022-02-22 NOTE — Progress Notes (Signed)
PT Cancellation Note  Patient Details Name: Harold Howard MRN: [id number] DOB: [birth year]   Cancelled Treatment:    Reason Eval/Treat Not Completed: Medical issues which prohibited therapy. Continued GI bleed.   Angelina Ok [facility] 03-03-22, 8:06 AM Skip Mayer PT Acute Rehabilitation Services Pager [phone] Office [phone]

## 2022-02-22 NOTE — Progress Notes (Signed)
0100: 2 PRCBs and 2 Cyro given rapid infusion via IV pump. Levophed drip titration has doubled despite blood transfusions.  ELINK made aware of larege bloody stools. Requested ground team to bedside.  [id number] Monday to [facility] MD, another 2 PRBCs and KCnetra ordered. This RN called and updated pts sister Lynden Ang, and significant other, Kelly.  0200Pola Corn MD order 4 plts, Armandina Stammer, DDAVP  0310: Spoke to Pola Corn MD, Pola Corn MD contacting GI. More PRBCs, Plts and Cryo ordered.   0211: Ground team to bedside.  0400: Giving ordered products.  1552: GI MD at bedside. MD called and spoke to family.  0540: GI setting up at bedside for scope. Last unit of ordered blood products started.  0802: While trying to reposition pt on left for GI scope CVC triple lumen and cordis was dislodged. Swelling to right neck noted and IV medications paused while assessing line. Stat chest xray ordered, ELINK MD and ground team aware made aware. Ground team to bedside. Left IO placed by IV team and all medications transferred and infusing via IO. Per ground team, ok to pull CVC when procedure done.  0630: Family present at hospital. GI able to start scope.  2336: Family updated and aware of events this morning.

## 2022-02-22 NOTE — Procedures (Signed)
Central Venous Catheter Insertion Procedure Note  JIBREEL LORIG  [id number]  1981/11/27  Date:02/14/2022  Time:8:21 AM   Provider Performing:Sherill Mangen D. Harris   Procedure: Insertion of Non-tunneled Central Venous Catheter(36556) with US guidance [id number])   Indication(s) Medication administration  Consent Unable to obtain consent due to emergent nature of procedure.  Anesthesia Topical only with 1% lidocaine   Timeout Verified patient identification, verified procedure, site/side was marked, verified correct patient position, special equipment/implants available, medications/allergies/relevant history reviewed, required imaging and test results available.  Sterile Technique Maximal sterile technique including full sterile barrier drape, hand hygiene, sterile gown, sterile gloves, mask, hair covering, sterile ultrasound probe cover (if used).  Procedure Description Area of catheter insertion was cleaned with chlorhexidine and draped in sterile fashion.  With real-time ultrasound guidance a central venous catheter was placed into the left femoral vein. Nonpulsatile blood flow and easy flushing noted in all ports.  The catheter was sutured in place and sterile dressing applied.  Complications/Tolerance None; patient tolerated the procedure well. Chest X-ray is ordered to verify placement for internal jugular or subclavian cannulation.   Chest x-ray is not ordered for femoral cannulation.  EBL Minimal  Specimen(s) None   Tyshaun Vinzant D. Kenton Kingfisher, NP-C [place] [facility] Personal contact information can be found on Amion  01/29/2022, 8:21 AM

## 2022-02-22 NOTE — Progress Notes (Addendum)
NAME:  Harold Howard, MRN:  [id number], DOB:  06-Aug-1981, LOS: 9 ADMISSION DATE:  02/15/2022, CONSULTATION DATE:  02/15/2022 REFERRING MD:  Voncille Lo, CHIEF COMPLAINT:  hemorrhagic shock   History of Present Illness:  Harold Howard is a 41 y/o gentleman with a history of daily ETOH use who presents with 1 month of vomiting blood and dark stools consistent with upper GI bleed s/p EGD on 2/19 with banding of esophageal varices.   History is provided by the ED and his step sister as he is intubated and sedated. His family is estranged due to his ETOH consumption. His step-sister indicated she is the only family he is in contact with. He drinks about a fifth per day, but has been working on cutting back recently, but he still drinks about a half gallon of liquor every 2-3 days. He came home from work early yesterday after texting his roommate a picture of a toilet bowl full of BRB. He was found on the floor this morning screaming to call 911. No emesis with EMS. Received 500cc crystalloid and zofran. He was hypotensive and developed worsening encephalopathy in the ED and required intubation. He had a seizure in the ED.  He received 8 pRBCs. PCCM consulted for admission. GI consulted by the ED.  No known use of NSAIDs or anticoagulants. His step-sister is not clear on his medical history.  Pertinent  Medical History  ETOH abuse  Significant Hospital Events: Including procedures, antibiotic start and stop dates in addition to other pertinent events   2/19 admission, intubated sedated, EGD  2/20 paracentesis  2/22 patient self-extubated, off pressors  2/27 Recurrent upper GI bleed on the floor, stabilized and transferred back to the ICU. GI notified  2/28 Underwent MTP twice afternoon of 2/27 he had TIPS with embolectomy but unfortunately re-bleed post TIPS   Procedures:  2/19 EGD: EGD yesterday with large esophageal varices, suspected gastric varices, and portal hypertensive gastropathy  2/27 Upper  EGD with large varices unable to identify bleeding site  2/27 TIPS with embolization of splenic and portal tirbutaries to varices  2/28 Upper EGD with Red blood and significant clot burden was found in the stomach, visualization was quite poor. Pylorus reached. No clear source of active bleeding coming from the stomach  Antibiotics:  2/19 Ceftriaxone for SBP prophylaxis   Interim History / Subjective:  Deeply sedated on vent   Objective   Blood pressure (!) 155/75, pulse 82, temperature (!) 95.5 F (35.3 C), temperature source Oral, resp. rate (!) 24, height 5\' 9"  (1.753 m), weight 103.7 kg, SpO2 100 %.    Vent Mode: PRVC FiO2 (%):  [30 %-100 %] 60 % Set Rate:  [20 bmp] 20 bmp Vt Set:  [560 mL] 560 mL PEEP:  [5 cmH20] 5 cmH20 Plateau Pressure:  [17 cmH20-21 cmH20] 17 cmH20   Intake/Output Summary (Last 24 hours) at 01/30/2022 0820 Last data filed at 02/14/2022 0750 Gross per 24 hour  Intake 19065.47 ml  Output 9025 ml  Net 10040.47 ml    Filed Weights   02/20/22 0500 02/05/2022 0500 02/12/2022 0441  Weight: 93.3 kg 92.5 kg 103.7 kg    Examination: General: Acute on chronically ill appearing decompensated male lying in bed on mechanical ventilation, in NAD HEENT: [place]/AT, MM pink/moist, PERRL,  Neuro: Deeply sedated on vent CV: s1s2 regular rate and rhythm, no murmur, rubs, or gallops,  PULM:  Clear to ascultation, no increased work of breathing, tolerating vent  GI: soft, bowel sounds active  in all 4 quadrants, taunt abdomen  Extremities: warm/dry, massive volume overload with anasarca  Skin: no rashes or lesions  Resolved Hospital Problem list   Hypocalcemia  Severe metabolic acidosis Hemorrhagic shock  Hyperkalemia   Assessment & Plan:   Acute on subacute GIB, recurrent as of 2/27 -Patient began to rebleed morning of 2/27 due to degree of massive bleed massive transfusion protocol (MTP) was initiated by afternoon of 2/27.  Post MTP patient stabilized slightly and was  able to undergo TIPS with embolization.  Unfortunately after TIPS patient again rebleed resulting in another MTP protocol initiated.  Patient has received over 60 units of PRBC. AS of 0530 2/28 patient has not shown further signs of bleeding  Esophageal Varices s/p banding x11 Recurrent hemorraghic shock  P: S/P MTP multiple times in the last 24hrs  Remains critically ill in the ICU GI and following, greatly appreciate assistance Closely monitor CBC and clotting factors Continue octreotide and Protonix drip Continue pressor for MAP goal > 65 Diurese as able  Little to no additional options available if patient were to rebleed family aware and have elected to make patient DNR.  Please see separate plan of care discussion note for full details  ETOH abuse  Decompensated cirrhosis  -Ammonia downtrending to 44. Hyperbilirubinemia and elevated transaminase  -Due to hypoperfusion, ETOH abuse.  -2/19 RUQ [place]: cirrhotic hepatic morphology, abdominopelvic ascites, no gallstones or biliary dilation, bidirectional flow is suspected in the main portal vein with varicosities at the hepatic hilum.  -MELD score 24.  -Paracentesis on 2/20 not consistent with SBP. SAAG is 1 g/dl. Liver work up negative thus far and etiology of cirrhosis is likely secondary to alcohol use. Peritoneal fluid culture showed no growth. P: GI following, appreciate assistance Continue lactulose Supplement thiamine, folate, and multivitamin EtOH cessation education Supportive care S/p phenobarbital taper  At risk for recurrent hypoxic respiratory failure given massive hematemesis in the setting of upper GI bleed  P: Continue ventilator support with lung protective strategies  Wean PEEP and FiO2 for sats greater than 90%. Head of bed elevated 30 degrees. Plateau pressures less than 30 cm H20.  Follow intermittent chest x-ray and ABG.   SAT/SBT as tolerated, mentation preclude extubation  Ensure adequate pulmonary hygiene   Follow cultures  VAP bundle in place  PAD protocol Keep patient deeply sedated today to avoid irritation in rebleed  Thrombocytopenia  Iron deficiency  -Ferritin 22 P: Trend CBC  Transfuse as needed   Seizure -Likely due to profound hypoperfusion and hypoxia -Head CT on 2/19 showed no acute intracranial pathology.  P: Maintain neuro protective measures Nutrition and bowel regiment  Seizure precautions  Aspirations precautions   Best Practice (right click and "Reselect all SmartList Selections" daily)   Diet/type: NPO DVT prophylaxis: prophylactic heparin  GI prophylaxis: PPI Lines: N/A   Foley:  N/A Code Status:  DNR Last date of multidisciplinary goals of care discussion [ with step sister on 2/19]   Critical care:  CRITICAL CARE Performed by: Rashi Granier D. Harris  Total critical care time: 42 minutes  Critical care time was exclusive of separately billable procedures and treating other patients.  Critical care was necessary to treat or prevent imminent or life-threatening deterioration.  Critical care was time spent personally by me on the following activities: development of treatment plan with patient and/or surrogate as well as nursing, discussions with consultants, evaluation of patient's response to treatment, examination of patient, obtaining history from patient or surrogate, ordering and performing  treatments and interventions, ordering and review of laboratory studies, ordering and review of radiographic studies, pulse oximetry and re-evaluation of patient's condition.  Zaidin Blyden D. Kenton Kingfisher, NP-C [place] [facility] Personal contact information can be found on Amion  02/02/2022, 8:20 AM

## 2022-02-22 NOTE — Op Note (Signed)
[facility] Patient Name: Harold Howard Procedure Date : 02/06/2022 MRN: [id number] Attending MD: Willaim Rayas. Jaymen Fetch , MD Date of Birth: [birth year] CSN: [id number] Age: 41 Admit Type: Inpatient Procedure:                Upper GI endoscopy Indications:              hematemesis, active gastrointestinal bleeding -                            large varices s/p TIPS today, post TIPS had massive                            bleeding, 8 units RBC rapid transfusion, platelets                            and other blood products. Repeat EGD as salvage                            therapy to try and stop bleeding Providers:                Viviann Spare P. Adela Lank, MD, Willy Eddy, RN,                            Rozetta Nunnery, Technician Referring MD:              Medicines:                Monitored Anesthesia Care Complications:            ongoing bleeding not able to be stopped                            endoscopically Estimated Blood Loss:     active bleeding Procedure:                Pre-Anesthesia Assessment:                           - Prior to the procedure, a History and Physical                            was performed, and patient medications and                            allergies were reviewed. The patient's tolerance of                            previous anesthesia was also reviewed. The risks                            and benefits of the procedure and the sedation                            options and risks were discussed with the patient.  All questions were answered, and informed consent                            was obtained. Prior Anticoagulants: The patient has                            taken no previous anticoagulant or antiplatelet                            agents. ASA Grade Assessment: IV - A patient with                            severe systemic disease that is a constant threat                            to life. After  reviewing the risks and benefits,                            the patient was deemed in satisfactory condition to                            undergo the procedure.                           After obtaining informed consent, the endoscope was                            passed under direct vision. Throughout the                            procedure, the patient's blood pressure, pulse, and                            oxygen saturations were monitored continuously. The                            GIF-H190 ([id number]) Olympus endoscope was introduced                            through the mouth, and advanced to the pylorus. The                            upper GI endoscopy was accomplished without                            difficulty. The patient tolerated the procedure                            well. Scope In: Scope Out: Findings:      Red blood and clot was found in the entire esophagus. I used the       therapeutic scope to try and lavage clot but after many minutes, with       continued reflux of blood and clot from the stomach it could  not be       cleared to visualize the entire esophagus. There was no focal source I       could clearly visualize to treat endoscopically.      Of what was able to be seen within the esophagus, the varices appeared       decompressed post TIPS and were small. Post banding ulcers were noted in       the mid and distal esophagus but no active extravasation was       appreciated. Again there was no clear focal pathology that was able to       be visualized that was amenable to endoscopic therapy. Varices have been       decompressed post TIPS.      Red blood and significant clot burden was found in the stomach,       visualization was quite poor. Pylorus reached. No clear source of active       bleeding coming from the stomach Impression:               - Red blood / clot in the esophagus - visualization                            poor without focal pathology  that was amenable to                            endoscopic therapy. Unfortunately despite several                            minutes of attempts to clear the field using                            therapeutic endoscope I could not see well enough                            to find focal source of bleeding. The varices have                            been decompressed post TIPS. Of the post banding                            ulcers that were visualized there was no active                            extravasation.                           - Red blood / clot burden in the stomach which                            prohibited visualization. Recommendation:           - Remain in the ICU for ongoing care.                           - NPO.                           -  Continue present medications (IV protonix,                            octreotide)                           - I have spoken with the ICU team about the case -                            consideration for [place] / [place] tube but                            this is a bridge to TIPS which he has already had                            and varices are decompressed, I think unlikely to                            provide a meaningful difference at this point.                            Refractory bleeding at this point not amenable to                            endoscopic therapy. I have spoken with the                            patient's sister, they understand the dire                            circumstances of his condition at this time and                            high risk for death. Primary service to discuss                            goals of care with family at this point - he has                            received significant blood products overnight and                            will need more for continued resuscitation efforts                            with active bleeding if this has not resolved post                             TIPS and medical therapy. ICU discussed case with                            IR overnight and they did not feel they had  anything else to offer.                           - Call with questions, will follow. Family updated. Procedure Code(s):        --- Professional ---                           [phone], 52, Esophagogastroduodenoscopy, flexible,                            transoral; diagnostic, including collection of                            specimen(s) by brushing or washing, when performed                            (separate procedure) Diagnosis Code(s):        --- Professional ---                           K22.8, Other specified diseases of esophagus                           I85.00, Esophageal varices without bleeding                           K92.2, Gastrointestinal hemorrhage, unspecified                           K92.0, Hematemesis CPT copyright 2019 [employer]. All rights reserved. The codes documented in this report are preliminary and upon coder review may  be revised to meet current compliance requirements. Viviann SpareSteven P. Adela LankArmbruster, MD 02/15/2022 7:40:33 AM This report has been signed electronically. Number of Addenda: 0

## 2022-02-22 NOTE — Progress Notes (Signed)
41 y/o M, severe ETOH abuse history, with esophageal varices, massive GIB, unresponsive to massive transfusion protocol, today went for TIPS/ Embolization of splenic and portal tributaries to varices/ Paracentesis 3L   Persistent rectal bleeding, OGT blood output, post procedure Hb 8.5, now 7.9 despite 4U PRBC. On Vaso/Levophed for BP maintenance    Plans: - getting another 4U PRBC now.  - got 2U cryoppt -> will repeat  - Kcentra stat dose now (50U/kg ) -> already given  - may consider Tranexamic acid  -stat CaCl- repeat dose  - stat DDAVP- already given  -4 units apheresis platelets ordered (8 total)  -check Fibrinogen  -keep MAP lower, closer to 90/60 or 100/60 permissive hypotension to allow slowing down of bleeding

## 2022-02-22 NOTE — Progress Notes (Signed)
Progress Note   Subjective  I was called by Critical care just recently about this patient. Cirrhosis with massive variceal bleed with EGD 2/19 with 11 bands placed. Bleeding was stopped at that time but had significant rebleeding this AM. Dr. Leone Payor did an EGD which showed large varices, unclear focal source of bleeding. He went for a TIPS per Dr. Deanne Coffer this evening, also had embolization of splenic / portal tributaries to the varices and paracentesis. Unfortunately about midnight he has had severe rebleeding. Total 8 units PRBC given to keep his Hgb around 8. He has significant bright red blood coming out of the OG and his mouth, and bright red blood per rectum. On vaspressin. Given multiple units of platelets which were in the 30s, now in the 70s on last draw. Got KCentra and cryo, DDAVP as well per critical care. On IV PPI and octreotide currently. BP in low 100s systolic, HR 70-80s currently. Sedated on vent / intubated. IR was contacted and they do not have any other measures they can offer the patient at this time.   Objective   Vital signs in last 24 hours: Temp:  [94 F (34.4 C)-98.9 F (37.2 C)] 95.5 F (35.3 C) (02/28 0503) Pulse Rate:  [65-130] 81 (02/28 0503) Resp:  [4-35] 25 (02/28 0503) BP: (64-157)/(43-109) 98/60 (02/28 0400) SpO2:  [94 %-100 %] 98 % (02/28 0503) Arterial Line BP: (108-164)/(46-72) 122/57 (02/28 0503) FiO2 (%):  [30 %-100 %] 55 % (02/28 0400) Weight:  [103.7 kg] 103.7 kg (02/28 0441) Last BM Date : 01/26/2022 General:    white male intubated / sedated, red blood on patient and in bed Heart:  Regular rate and rhythm;  Lungs: Respirations even and unlabored, Abdomen:  Soft Extremities:  Without edema.   Intake/Output from previous day: 02/27 0701 - 02/28 0700 In: 17665.4 [I.V.:6916.6; Blood:9899.5; IV Piggyback:849.3] Out: 8800 [Urine:1300; Stool:200; Blood:3200] Intake/Output this shift: Total I/O In: 11075.4 [I.V.:4490.2; Blood:5735.9; IV  Piggyback:849.3] Out: 5600 [Urine:1100; Other:4100; Stool:200; Blood:200]  Lab Results: Recent Labs    01/31/2022 2145 02/08/2022 2243 01/27/2022 0118 02/03/2022 0327  WBC 10.4  --  11.8* 11.6*  HGB 9.8* 8.5* 7.9* 8.0*  HCT 27.8* 25.0* 22.7* 22.9*  PLT 31*  --  30*   30* 76*   BMET Recent Labs    02/06/2022 2145 02/07/2022 2146 02/06/2022 2243 02/17/2022 0118  NA 136 136 137 137  K 3.8 3.9 3.9 3.8  CL 105 105  --  105  CO2 23 23  --  23  GLUCOSE 154* 154*  --  163*  BUN 17 17  --  17  CREATININE 0.86 0.87  --  0.84  CALCIUM 7.4* 7.5*  --  7.2*   LFT Recent Labs    02/18/2022 0118  PROT 3.1*  ALBUMIN 2.0*  AST 32  ALT 16  ALKPHOS 30*  BILITOT 4.5*   PT/INR Recent Labs    02/17/2022 2145 02/01/2022 0118  LABPROT 18.5* 20.8*  INR 1.5* 1.8*    Studies/Results: DG CHEST PORT 1 VIEW  Result Date: 01/30/2022 CLINICAL DATA:  Central line placement. EXAM: PORTABLE CHEST 1 VIEW COMPARISON:  02/14/2022 FINDINGS: 1148 hours. Endotracheal tube tip is 4.7 cm above the base of the carina. Right IJ sheath noted with tip overlying the region of the innominate vein confluence. No evidence for pneumothorax. No pulmonary edema or pleural effusion. Probable small hiatal hernia. The cardiopericardial silhouette is within normal limits for size. Telemetry leads overlie the  chest. IMPRESSION: 1. Endotracheal tube tip 4.7 cm above the base of the carina. 2. Right IJ sheath with tip overlying the region of the innominate vein confluence. 3. No pulmonary edema or focal consolidation. Electronically Signed   By: Kennith Center M.D.   On: 02/08/2022 12:00   CT Angio Abd/Pel w/ and/or w/o  Result Date: 02/20/2022 CLINICAL DATA:  GI bleed. EXAM: CTA ABDOMEN AND PELVIS WITHOUT AND WITH CONTRAST TECHNIQUE: Multidetector CT imaging of the abdomen and pelvis was performed using the standard protocol during bolus administration of intravenous contrast. Multiplanar reconstructed images and MIPs were obtained and  reviewed to evaluate the vascular anatomy. RADIATION DOSE REDUCTION: This exam was performed according to the departmental dose-optimization program which includes automated exposure control, adjustment of the mA and/or kV according to patient size and/or use of iterative reconstruction technique. CONTRAST:  OMNIPAQUE IOHEXOL 350 MG/ML SOLN COMPARISON:  None. FINDINGS: VASCULAR Aorta: Age advanced atherosclerotic changes involving the aorta but no aneurysm dissection. Celiac: Of separate origins of the hepatic and splenic arteries. Both vessels are patent. SMA: Normal Renals: Normal IMA: Patent Inflow: Age advanced atherosclerotic calcifications but no aneurysm or dissection or occlusion. Proximal Outflow: Patent Veins: Unremarkable. Review of the MIP images confirms the above findings. NON-VASCULAR Lower chest: Bilateral pleural effusions and overlying atelectasis. The heart is within normal limits in size. Moderate-sized hiatal hernia. Paraesophageal varices. Hepatobiliary: Cirrhotic changes involving the liver but no arterial phase enhancing lesions to suggest hepatoma. No intrahepatic biliary dilatation. The gallbladder is mildly distended and contains some layering sludge or gallstones. No common bile duct dilatation. Pancreas: No mass, inflammation or ductal dilatation. Spleen: Normal size. No focal lesions. The splenic vein is patent. Small focus of thrombus is noted in the portal vein but no occlusive clot. Adrenals/Urinary Tract: The adrenal glands are unremarkable. Left renal calculus and small scattered renal cysts but no worrisome renal lesions or hydronephrosis. The bladder contains a Foley catheter. Stomach/Bowel: The stomach is moderately distended. Do not see any evidence of active contrast extravasation to suggest hemorrhage. The duodenum, small bowel and colon are grossly normal. No evidence of active extravasation to account for an active GI bleed. No obstructive findings. Lymphatic: Upper  abdominal lymph nodes typical with cirrhosis. No retroperitoneal mass or adenopathy. Reproductive: The prostate gland and seminal vesicles are unremarkable. Other: Large volume abdominal/pelvic ascites and diffuse and fairly marked body wall edema. Musculoskeletal: No significant bony findings. IMPRESSION: 1. Age advanced atherosclerotic changes involving the aorta and branch vessels. No aneurysm or dissection. 2. No evidence of active contrast extravasation to account for an active GI bleed. 3. Advanced cirrhotic changes involving the liver with portal venous hypertension, portal venous collaterals, paraesophageal varices and large volume abdominal/pelvic ascites. 4. No worrisome arterial phase enhancing lesions to suggest hepatoma. 5. Bilateral pleural effusions and overlying atelectasis. 6. Small focus of thrombus in the portal vein but no occlusive clot. Aortic Atherosclerosis (ICD10-I70.0). Electronically Signed   By: Rudie Meyer M.D.   On: 02/16/2022 18:12       Assessment / Plan:   41 y/o male with alcoholic cirrhosis admitted with massive variceal bleed over a week ago s/p banding, had recurrent bleed this AM, significant hematemesis. EGD this AM showed large varices but could not localize clear source within the esophagus, no banding done, patient referred to IR for TIPS with embolization of splenic / portal tributaries which was done this evening. Unfortunately despite these measures per IR the patient had massive rebleeding this evening. Multiple  blood products given, including 8 units PRBC rapid transfusion protocol and multiple units of platelets this evening, has kept his Hgb around 8. On pressors. Dire situation at this point with refractory bleeding and high risk for death.   I discussed the situation with [place] the patient's sister - they have wanted all measures done to this point. We discussed role of repeat emergent EGD, I think may be very difficult to control the bleeding  endoscopically as outlined previously this morning but as last measure we can try emergent EGD with re-banding the varices to see if this will help, as he otherwise is exsanguinating with high risk for death without further intervention. I have discussed risks / benefits of this exam, [place] understands the situation as last salvage measure, that it may not be possible to control the bleeding endoscopically at this point but does want [place] to try in case this may help. His platelet has risen from count of 30s to 70s which increases chance for hemostasis. Endoscopic team en route to the hospital at this time and can hopefully start the case soon. Family en route to the hospital as well. Otherwise continue IV protonix and octreotide, supportive measures.  Harlin Rain, MD [facility]

## 2022-02-22 NOTE — Progress Notes (Signed)
PCCM progress note  Notified by patient's bedside nurse that additional plan members have called to inquire about patient's status.  She states patient's grandmother is uncomfortable with decision of DO NOT RESUSCITATE and would like further clarification.  Patient's grandmother is Patric Dykes contact number [phone].  She reports that [place] is not biologically related to patient and that Tresa Endo is only a significant other and not legally married to patient.   Patient's critical status reviewed with Thelma Barge and all questions answered.  She was educated on rationale for DO NOT RESUSCITATE order and is comfortable with continuing with this for now.  I have encouraged her to coordinate with all family members who would be the legal decision makers and ensure all parties present to hospital tomorrow to determine who will be patient's decision maker moving forward.  I contacted Tresa Endo and updated her regarding my conversation with Dineen Kid D. Tiburcio Pea, NP-C [facility] Personal contact information can be found on Amion  02/06/2022, 6:22 PM

## 2022-02-22 NOTE — Progress Notes (Signed)
° °  Echocardiogram 2D Echocardiogram has been performed.  Harold Howard 02-24-22, 9:54 AM

## 2022-02-22 NOTE — Plan of Care (Signed)
°  Interdisciplinary Goals of Care Family Meeting   Date carried out:: 02/20/2022  Location of the meeting: Conference room  Member's involved: Nurse Practitioner and Family Member or next of kin  Durable Power of Attorney or acting medical decision maker: Shared among patients significant other and his sister     Discussion: We discussed goals of care for [employer] .  I spoke with both patient significant other ans his sister regarding his continued severe critical illness. There are little to no further interventions to be offered if patient were to rebleed. The family states they understand his critical illness and do not wish to prolong any suffering. Therefore decision was made to make patient a DNR in the event he were to decompensate yet again. As of now they would like to continue currently plan of care as he appears to have stabilized slightly.   Code status: Full DNR  Disposition: Continue current acute care  Time spent for the meeting: 5  Ronnie Mallette D. Harris 02/12/2022, 8:44 AM

## 2022-02-22 NOTE — Progress Notes (Signed)
OT Cancellation Note  Patient Details Name: ADONNIS SALCEDA MRN: [id number] DOB: [birth year]   Cancelled Treatment:    Reason Eval/Treat Not Completed: Medical issues which prohibited therapy Ongoing severe GI bleed. Will hold therapy attempts today.  Lorre Munroe 02/11/2022, 8:21 AM

## 2022-02-23 ENCOUNTER — Encounter (HOSPITAL_COMMUNITY): Payer: Self-pay | Admitting: Gastroenterology

## 2022-02-23 LAB — PREPARE PLATELET PHERESIS
Unit division: 0
Unit division: 0
Unit division: 0
Unit division: 0

## 2022-02-23 LAB — PREPARE FRESH FROZEN PLASMA
Unit division: 0
Unit division: 0
Unit division: 0
Unit division: 0
Unit division: 0
Unit division: 0
Unit division: 0
Unit division: 0
Unit division: 0
Unit division: 0

## 2022-02-23 LAB — BPAM CRYOPRECIPITATE
Blood Product Expiration Date: 202302280145
Blood Product Expiration Date: 202302280145
Blood Product Expiration Date: 202302280949
Blood Product Expiration Date: 202302280949
ISSUE DATE / TIME: 202302280044
ISSUE DATE / TIME: 202302280044
ISSUE DATE / TIME: 202302280400
ISSUE DATE / TIME: 202302280402
Unit Type and Rh: 5100
Unit Type and Rh: 5100
Unit Type and Rh: 5100
Unit Type and Rh: 5100

## 2022-02-23 LAB — BPAM PLATELET PHERESIS
Blood Product Expiration Date: 202303022359
Blood Product Expiration Date: 202303022359
Blood Product Expiration Date: 202303022359
Blood Product Expiration Date: 202303022359
ISSUE DATE / TIME: 202302280223
ISSUE DATE / TIME: 202302280223
ISSUE DATE / TIME: 202302280334
ISSUE DATE / TIME: 202302280429
Unit Type and Rh: 5100
Unit Type and Rh: 5100
Unit Type and Rh: 5100
Unit Type and Rh: 7300

## 2022-02-23 LAB — BASIC METABOLIC PANEL
Anion gap: 5 (ref 5–15)
BUN: 24 mg/dL — ABNORMAL HIGH (ref 6–20)
CO2: 25 mmol/L (ref 22–32)
Calcium: 7.8 mg/dL — ABNORMAL LOW (ref 8.9–10.3)
Chloride: 108 mmol/L (ref 98–111)
Creatinine, Ser: 0.94 mg/dL (ref 0.61–1.24)
GFR, Estimated: >60 mL/min (ref >60)
Glucose, Bld: 113 mg/dL — ABNORMAL HIGH (ref 70–99)
Potassium: 3.6 mmol/L (ref 3.5–5.1)
Sodium: 138 mmol/L (ref 135–145)

## 2022-02-23 LAB — BPAM FFP
Blood Product Expiration Date: 202303022359
Blood Product Expiration Date: 202303022359
Blood Product Expiration Date: 202303022359
Blood Product Expiration Date: 202303042359
Blood Product Expiration Date: 202303042359
Blood Product Expiration Date: 202303042359
Blood Product Expiration Date: 202303042359
Blood Product Expiration Date: 202303042359
Blood Product Expiration Date: 202303042359
Blood Product Expiration Date: 202303042359
Blood Product Expiration Date: 202303042359
Blood Product Expiration Date: 202303042359
Blood Product Expiration Date: 202303042359
Blood Product Expiration Date: 202303042359
Blood Product Expiration Date: 202303042359
Blood Product Expiration Date: 202303042359
Blood Product Expiration Date: 202303042359
Blood Product Expiration Date: 202303042359
Blood Product Expiration Date: 202303042359
Blood Product Expiration Date: 202303102359
Blood Product Expiration Date: 202303102359
ISSUE DATE / TIME: 202302271104
ISSUE DATE / TIME: 202302271341
ISSUE DATE / TIME: 202302271341
ISSUE DATE / TIME: 202302271341
ISSUE DATE / TIME: 202302271341
ISSUE DATE / TIME: 202302271620
ISSUE DATE / TIME: 202302271620
ISSUE DATE / TIME: 202302271620
ISSUE DATE / TIME: 202302271620
ISSUE DATE / TIME: 202302271626
ISSUE DATE / TIME: 202302271626
ISSUE DATE / TIME: 202302271626
ISSUE DATE / TIME: 202302271626
ISSUE DATE / TIME: 202302271733
ISSUE DATE / TIME: 202302271733
ISSUE DATE / TIME: 202302271822
ISSUE DATE / TIME: 202302281242
ISSUE DATE / TIME: 202302281242
ISSUE DATE / TIME: 202303010052
ISSUE DATE / TIME: 202303010052
ISSUE DATE / TIME: 202303010052
Unit Type and Rh: 5100
Unit Type and Rh: 5100
Unit Type and Rh: 5100
Unit Type and Rh: 5100
Unit Type and Rh: 5100
Unit Type and Rh: 5100
Unit Type and Rh: 5100
Unit Type and Rh: 600
Unit Type and Rh: 600
Unit Type and Rh: 6200
Unit Type and Rh: 6200
Unit Type and Rh: 6200
Unit Type and Rh: 6200
Unit Type and Rh: 6200
Unit Type and Rh: 6200
Unit Type and Rh: 6200
Unit Type and Rh: 6200
Unit Type and Rh: 6200
Unit Type and Rh: 6200
Unit Type and Rh: 9500
Unit Type and Rh: 9500

## 2022-02-23 LAB — PREPARE CRYOPRECIPITATE
Unit division: 0
Unit division: 0
Unit division: 0
Unit division: 0

## 2022-02-23 LAB — AMMONIA: Ammonia: 78 umol/L — ABNORMAL HIGH (ref 9–35)

## 2022-02-23 LAB — CBC
HCT: 28.2 % — ABNORMAL LOW (ref 39.0–52.0)
Hemoglobin: 10.2 g/dL — ABNORMAL LOW (ref 13.0–17.0)
MCH: 31 pg (ref 26.0–34.0)
MCHC: 36.2 g/dL — ABNORMAL HIGH (ref 30.0–36.0)
MCV: 85.7 fL (ref 80.0–100.0)
Platelets: 61 10*3/uL — ABNORMAL LOW (ref 150–400)
RBC: 3.29 MIL/uL — ABNORMAL LOW (ref 4.22–5.81)
RDW: 15 % (ref 11.5–15.5)
WBC: 13.7 10*3/uL — ABNORMAL HIGH (ref 4.0–10.5)
nRBC: 0.3 % — ABNORMAL HIGH (ref 0.0–0.2)

## 2022-02-23 LAB — HEMOGLOBIN AND HEMATOCRIT, BLOOD
HCT: 18.5 % — ABNORMAL LOW (ref 39.0–52.0)
Hemoglobin: 6 g/dL — CL (ref 13.0–17.0)

## 2022-02-23 LAB — PROTIME-INR
INR: 1.4 — ABNORMAL HIGH (ref 0.8–1.2)
Prothrombin Time: 17.3 seconds — ABNORMAL HIGH (ref 11.4–15.2)

## 2022-02-23 LAB — GLUCOSE, CAPILLARY
Glucose-Capillary: 92 mg/dL (ref 70–99)
Glucose-Capillary: 96 mg/dL (ref 70–99)
Glucose-Capillary: 87 mg/dL (ref 70–99)
Glucose-Capillary: 89 mg/dL (ref 70–99)
Glucose-Capillary: 90 mg/dL (ref 70–99)

## 2022-02-23 LAB — MAGNESIUM: Magnesium: 1.5 mg/dL — ABNORMAL LOW (ref 1.7–2.4)

## 2022-02-23 MED ORDER — POTASSIUM CHLORIDE 10 MEQ/50ML IV SOLN
10.0000 meq | INTRAVENOUS | Status: AC
  Administered 2022-02-23 (×6): 10 meq via INTRAVENOUS
  Filled 2022-02-23 (×6): qty 50

## 2022-02-23 MED ORDER — THIAMINE HCL 100 MG/ML IJ SOLN
100.0000 mg | Freq: Every day | INTRAMUSCULAR | Status: DC
  Administered 2022-02-23 – 2022-02-24 (×2): 100 mg via INTRAVENOUS
  Filled 2022-02-23 (×2): qty 2

## 2022-02-23 MED ORDER — MAGNESIUM SULFATE 50 % IJ SOLN
3.0000 g | Freq: Once | INTRAVENOUS | Status: AC
  Administered 2022-02-23: 3 g via INTRAVENOUS
  Filled 2022-02-23: qty 6

## 2022-02-23 MED ORDER — LACTULOSE ENEMA
300.0000 mL | Freq: Two times a day (BID) | ORAL | Status: DC
  Administered 2022-02-23 – 2022-02-24 (×3): 300 mL via RECTAL
  Filled 2022-02-23 (×3): qty 300

## 2022-02-23 MED ORDER — FUROSEMIDE 10 MG/ML IJ SOLN
60.0000 mg | INTRAMUSCULAR | Status: AC
  Administered 2022-02-23 (×2): 60 mg via INTRAVENOUS
  Filled 2022-02-23 (×2): qty 6

## 2022-02-23 MED ORDER — PANTOPRAZOLE SODIUM 40 MG IV SOLR
40.0000 mg | Freq: Two times a day (BID) | INTRAVENOUS | Status: DC
  Administered 2022-02-23 – 2022-02-26 (×6): 40 mg via INTRAVENOUS
  Filled 2022-02-23 (×6): qty 10

## 2022-02-23 MED ORDER — FOLIC ACID 5 MG/ML IJ SOLN
1.0000 mg | Freq: Every day | INTRAMUSCULAR | Status: DC
  Administered 2022-02-23 – 2022-02-24 (×2): 1 mg via INTRAVENOUS
  Filled 2022-02-23 (×2): qty 0.2

## 2022-02-23 MED ORDER — CALCIUM GLUCONATE-NACL 1-0.675 GM/50ML-% IV SOLN
1.0000 g | Freq: Once | INTRAVENOUS | Status: AC
  Administered 2022-02-23: 1000 mg via INTRAVENOUS
  Filled 2022-02-23: qty 50

## 2022-02-23 NOTE — Progress Notes (Signed)
Inpatient Rehab Admissions Coordinator:  ? ?Still too unstable to consider for CIR.  WIll sign off.  Please re-consult when off pressors and mechanical ventilation.   ?Estill Dooms, PT, DPT ?Admissions Coordinator ?[phone] ?02/23/22  ?9:30 AM ? ?

## 2022-02-23 NOTE — Progress Notes (Signed)
Referring Physician(s): Dr. Lenice Llamas  Supervising Physician: Ruthann Cancer  Patient Status:  [facility] - In-pt  Chief Complaint: GI bleed Esophageal varices  Subjective: Patient currently stable without ongoing hemorrhage, however remains critically ill.  Intubate/sedated this AM.  Hgb 10.2 Remains on levophed and vasopressin.  Allergies: Patient has no known allergies.   Vital Signs: BP (!) 100/57    Pulse 70    Temp 98.2 F (36.8 C) (Axillary)    Resp 20    Ht 5\' 9"  (1.753 m)    Wt 228 lb 9.9 oz (103.7 kg)    SpO2 98%    BMI 33.76 kg/m   Physical Exam Intubated/sedated Neck: IJ procedure site intact, no oozing. Old dried blood on dressing.  Abdomen:  Distended, right lateral puncture site with small amount of fresh blood.  Leaking ascites.  Skin: [email or web address]  Imaging: IR Tips  Result Date: 01/30/2022 CLINICAL DATA:  Cirrhosis, portal venous hypertension, GI bleed with esophageal varices post banding, incompletely controlled. Hemodynamic instability. EXAM: 1. TIPS CREATION 2. COIL EMBOLIZATION OF VENOUS VARICES 3. ULTRASOUND-GUIDED VENOUS ACCESS 4. PARACENTESIS WITH ULTRASOUND GUIDANCE ANESTHESIA/SEDATION: General - as administered by the Anesthesia department MEDICATIONS: Per anesthesia department FLUOROSCOPY TIME:  Radiation Exposure Index (as provided by the fluoroscopic device): Q000111Q mGy Kerma COMPLICATIONS: None immediate. PROCEDURE: Informed written consent was obtained from the family after a thorough discussion of the procedural risks, benefits and alternatives. All questions were addressed. See previous consultation. Maximal Sterile Barrier Technique was utilized including caps, mask, sterile gowns, sterile gloves, sterile drape, hand hygiene and skin antiseptic. A timeout was performed prior to the initiation of the procedure. The skin overlying the right upper abdominal quadrant as well as the right neck and femoral region were prepped and draped in usual sterile  fashion. Initial ultrasound scanning demonstrates a moderate amount of recurrent  ascites. Under ultrasound guidance, a 6 [place] Safe-T-Centesis needle was advanced into the peritoneal cavity from a right lateral approach for paracentesis removing 3 L clear yellow ascites. Under direct ultrasound guidance, patency of the right common femoral vein was identified and documented. Micropuncture access under ultrasound guidance was achieved. Dilator exchanged over guidewire for 8 French vascular sheath, through which the ICE ultrasound probe was advanced into the central aspect of the IVC. Next, the right internal jugular vein patency was confirmed and documented with ultrasound, then the vein was accessed under direct ultrasound guidance with micropuncture set. This allowed for placement of the 10 French TIPS vascular sheath. 5 French angled angiographic catheter was advanced initially to the right hepatic vein and then into the middle hepatic vein under fluoroscopic and ultrasound guidance. Catheter exchanged over guidewire for a ScorpionT TIPS access set, advanced into the central aspect of right portal vein. Angled Glidewire was advanced into the portal vein. The access set was advanced sequentially into the portal vein. This was exchanged over guidewire for a pigtail measuring catheter. Portal venogram was performed demonstrating patency, slow antegrade flow through the main and right portal vein with limited opacification of the left portal vein. Large collaterals to esophageal varices were identified from the proximal portal vein. Pigtail catheter exchanged over guidewire. Tract was dilated with Conquest angioplasty balloon. The 10 French sheath was advanced into the portal vein. Next, a 2 cm (un covered) x 8 cm (covered) x 10 mm diameter GORE VIATORR TIPS Endoprosthesis was advanced through the sheath across the intra hepatic track and deployed. The TIPS stent was angioplastied in multiple stations to 10 mm  diameter. Follow-up portal venogram demonstrated good stent deployment and flow, no residual stenosis. No further antegrade flow into the right left portal vein branches evident. Persistent opacification of large collateral channel supplying the esophageal varices. Attention then turned to variceal embolization. Dominant varices from the proximal portal vein and mid splenic vein were occluded with a total of 16 interlock and Ruby coils, to virtual cessation of flow through the channels. Final venogram from the peripheral splenic vein shows excellent antegrade flow through splenic and portal veins, no extravasation or other apparent complication. Catheters and sheaths were removed from the patient.Hemostasis was achieved at the right femoral access site with manual compression. Hemostasis was achieved at the right IJ access site with manual compression. A dressing was placed. The patient tolerated the procedure well. Patient transferred to the PACU. IMPRESSION: 1. Technically successful TIPS creation. 2. Coil embolization of dominant splenic and portal collateral venous channels supplying esophageal varices. 3. Ultrasound guided paracentesis removing 3 L clear yellow ascites. Electronically Signed   By: Lucrezia Europe M.D.   On: 02/03/2022 09:07   DG CHEST PORT 1 VIEW  Result Date: 02/14/2022 CLINICAL DATA:  Central line placement. EXAM: PORTABLE CHEST 1 VIEW COMPARISON:  02/03/2022 FINDINGS: 0607 hours. Low volume film. The cardio pericardial silhouette is enlarged. Bibasilar atelectasis/infiltrate noted. Endotracheal tube tip is 2.8 cm above the base of the carina. Right IJ central line tip overlies the right lung apex. No evidence for pneumothorax. Telemetry leads overlie the chest. IMPRESSION: 1. Right IJ central line tip overlies the right lung apex. No pneumothorax. 2. Bibasilar atelectasis/infiltrate. Electronically Signed   By: Misty Stanley M.D.   On: 02/20/2022 07:16   DG CHEST PORT 1 VIEW  Result Date:  02/11/2022 CLINICAL DATA:  Central line placement. EXAM: PORTABLE CHEST 1 VIEW COMPARISON:  02/14/2022 FINDINGS: 1148 hours. Endotracheal tube tip is 4.7 cm above the base of the carina. Right IJ sheath noted with tip overlying the region of the innominate vein confluence. No evidence for pneumothorax. No pulmonary edema or pleural effusion. Probable small hiatal hernia. The cardiopericardial silhouette is within normal limits for size. Telemetry leads overlie the chest. IMPRESSION: 1. Endotracheal tube tip 4.7 cm above the base of the carina. 2. Right IJ sheath with tip overlying the region of the innominate vein confluence. 3. No pulmonary edema or focal consolidation. Electronically Signed   By: Misty Stanley M.D.   On: 02/07/2022 12:00   ECHOCARDIOGRAM COMPLETE  Result Date: 01/30/2022    ECHOCARDIOGRAM REPORT   Patient Name:   Harold Howard Date of Exam: 02/14/2022 Medical Rec #:  [id number]        Height:       69.0 in Accession #:    [id number]       Weight:       228.6 lb Date of Birth:  [birth year]         BSA:          2.187 m Patient Age:    41 years         BP:           155/75 mmHg Patient Gender: M                HR:           69 bpm. Exam Location:  Inpatient Procedure: 2D Echo, Cardiac Doppler and Color Doppler Indications:    PRE OP EVAL  History:        Patient has no prior  history of Echocardiogram examinations.                 ETOH.  Sonographer:    Beryle Beams Referring Phys: [place]  1. Left ventricular ejection fraction, by estimation, is 65 to 70%. The left ventricle has normal function. The left ventricle has no regional wall motion abnormalities. Left ventricular diastolic parameters were normal.  2. Right ventricular systolic function is normal. The right ventricular size is normal. There is normal pulmonary artery systolic pressure.  3. The mitral valve is normal in structure. No evidence of mitral valve regurgitation. No evidence of mitral stenosis.  4. The  aortic valve is tricuspid. Aortic valve regurgitation is mild. Comparison(s): No prior Echocardiogram. FINDINGS  Left Ventricle: Left ventricular ejection fraction, by estimation, is 65 to 70%. The left ventricle has normal function. The left ventricle has no regional wall motion abnormalities. The left ventricular internal cavity size was normal in size. There is  no left ventricular hypertrophy. Left ventricular diastolic parameters were normal. Right Ventricle: The right ventricular size is normal. No increase in right ventricular wall thickness. Right ventricular systolic function is normal. There is normal pulmonary artery systolic pressure. The tricuspid regurgitant velocity is 2.51 m/s, and  with an assumed right atrial pressure of 3 mmHg, the estimated right ventricular systolic pressure is Q000111Q mmHg. Left Atrium: Left atrial size was normal in size. Right Atrium: Right atrial size was normal in size. Pericardium: There is no evidence of pericardial effusion. Mitral Valve: The mitral valve is normal in structure. No evidence of mitral valve regurgitation. No evidence of mitral valve stenosis. Tricuspid Valve: The tricuspid valve is normal in structure. Tricuspid valve regurgitation is mild . No evidence of tricuspid stenosis. Aortic Valve: The aortic valve is tricuspid. Aortic valve regurgitation is mild. Aortic regurgitation PHT measures 558 msec. Aortic valve mean gradient measures 4.0 mmHg. Aortic valve peak gradient measures 8.1 mmHg. Aortic valve area, by VTI measures 3.31 cm. Pulmonic Valve: The pulmonic valve was normal in structure. Pulmonic valve regurgitation is not visualized. No evidence of pulmonic stenosis. Aorta: The aortic root and ascending aorta are structurally normal, with no evidence of dilitation. IAS/Shunts: No atrial level shunt detected by color flow Doppler.  LEFT VENTRICLE PLAX 2D LVIDd:         4.90 cm     Diastology LVIDs:         2.80 cm     LV e' medial:    9.01 cm/s LV PW:          0.90 cm     LV E/e' medial:  6.4 LV IVS:        0.90 cm     LV e' lateral:   10.60 cm/s LVOT diam:     2.20 cm     LV E/e' lateral: 5.5 LV SV:         97 LV SV Index:   44 LVOT Area:     3.80 cm  LV Volumes (MOD) LV vol d, MOD A2C: 79.9 ml LV vol d, MOD A4C: 78.9 ml LV vol s, MOD A2C: 21.7 ml LV vol s, MOD A4C: 20.8 ml LV SV MOD A2C:     58.2 ml LV SV MOD A4C:     78.9 ml LV SV MOD BP:      57.5 ml RIGHT VENTRICLE RV S prime:     19.30 cm/s RVOT diam:      2.40 cm TAPSE (M-mode): 2.7  cm LEFT ATRIUM             Index        RIGHT ATRIUM           Index LA diam:        3.80 cm 1.74 cm/m   RA Area:     15.60 cm LA Vol (A2C):   38.1 ml 17.42 ml/m  RA Volume:   35.70 ml  16.33 ml/m LA Vol (A4C):   26.3 ml 12.03 ml/m LA Biplane Vol: 32.4 ml 14.82 ml/m  AORTIC VALVE                    PULMONIC VALVE AV Area (Vmax):    3.21 cm     PV Vmax:       0.67 m/s AV Area (Vmean):   3.45 cm     PV Vmean:      42.200 cm/s AV Area (VTI):     3.31 cm     PV VTI:        0.185 m AV Vmax:           142.00 cm/s  PV Peak grad:  1.8 mmHg AV Vmean:          91.600 cm/s  PV Mean grad:  1.0 mmHg AV VTI:            0.292 m AV Peak Grad:      8.1 mmHg AV Mean Grad:      4.0 mmHg LVOT Vmax:         120.00 cm/s LVOT Vmean:        83.200 cm/s LVOT VTI:          0.254 m LVOT/AV VTI ratio: 0.87 AI PHT:            558 msec  AORTA Ao Root diam: 3.20 cm Ao Asc diam:  3.00 cm MITRAL VALVE               TRICUSPID VALVE MV Area (PHT): 4.80 cm    TV Peak grad:   23.8 mmHg MV Decel Time: 158 msec    TV Mean grad:   16.0 mmHg MV E velocity: 57.80 cm/s  TV Vmax:        2.44 m/s MV A velocity: 64.70 cm/s  TV Vmean:       191.0 cm/s MV E/A ratio:  0.89        TV VTI:         0.86 msec                            TR Peak grad:   25.2 mmHg                            TR Vmax:        251.00 cm/s                             SHUNTS                            Systemic VTI:  0.25 m                            Systemic Diam: 2.20 cm  Pulmonic Diam: 2.40 cm Rudean Haskell MD Electronically signed by Rudean Haskell MD Signature Date/Time: 02/02/2022/12:04:54 PM    Final    IR IVUS EACH ADDITIONAL NON CORONARY VESSEL  Result Date: 01/28/2022 CLINICAL DATA:  Cirrhosis, portal venous hypertension, GI bleed with esophageal varices post banding, incompletely controlled. Hemodynamic instability. EXAM: 1. TIPS CREATION 2. COIL EMBOLIZATION OF VENOUS VARICES 3. ULTRASOUND-GUIDED VENOUS ACCESS 4. PARACENTESIS WITH ULTRASOUND GUIDANCE ANESTHESIA/SEDATION: General - as administered by the Anesthesia department MEDICATIONS: Per anesthesia department FLUOROSCOPY TIME:  Radiation Exposure Index (as provided by the fluoroscopic device): Q000111Q mGy Kerma COMPLICATIONS: None immediate. PROCEDURE: Informed written consent was obtained from the family after a thorough discussion of the procedural risks, benefits and alternatives. All questions were addressed. See previous consultation. Maximal Sterile Barrier Technique was utilized including caps, mask, sterile gowns, sterile gloves, sterile drape, hand hygiene and skin antiseptic. A timeout was performed prior to the initiation of the procedure. The skin overlying the right upper abdominal quadrant as well as the right neck and femoral region were prepped and draped in usual sterile fashion. Initial ultrasound scanning demonstrates a moderate amount of recurrent  ascites. Under ultrasound guidance, a 6 [place] Safe-T-Centesis needle was advanced into the peritoneal cavity from a right lateral approach for paracentesis removing 3 L clear yellow ascites. Under direct ultrasound guidance, patency of the right common femoral vein was identified and documented. Micropuncture access under ultrasound guidance was achieved. Dilator exchanged over guidewire for 8 French vascular sheath, through which the ICE ultrasound probe was advanced into the central aspect of the IVC. Next, the right internal jugular vein  patency was confirmed and documented with ultrasound, then the vein was accessed under direct ultrasound guidance with micropuncture set. This allowed for placement of the 10 French TIPS vascular sheath. 5 French angled angiographic catheter was advanced initially to the right hepatic vein and then into the middle hepatic vein under fluoroscopic and ultrasound guidance. Catheter exchanged over guidewire for a ScorpionT TIPS access set, advanced into the central aspect of right portal vein. Angled Glidewire was advanced into the portal vein. The access set was advanced sequentially into the portal vein. This was exchanged over guidewire for a pigtail measuring catheter. Portal venogram was performed demonstrating patency, slow antegrade flow through the main and right portal vein with limited opacification of the left portal vein. Large collaterals to esophageal varices were identified from the proximal portal vein. Pigtail catheter exchanged over guidewire. Tract was dilated with Conquest angioplasty balloon. The 10 French sheath was advanced into the portal vein. Next, a 2 cm (un covered) x 8 cm (covered) x 10 mm diameter GORE VIATORR TIPS Endoprosthesis was advanced through the sheath across the intra hepatic track and deployed. The TIPS stent was angioplastied in multiple stations to 10 mm diameter. Follow-up portal venogram demonstrated good stent deployment and flow, no residual stenosis. No further antegrade flow into the right left portal vein branches evident. Persistent opacification of large collateral channel supplying the esophageal varices. Attention then turned to variceal embolization. Dominant varices from the proximal portal vein and mid splenic vein were occluded with a total of 16 interlock and Ruby coils, to virtual cessation of flow through the channels. Final venogram from the peripheral splenic vein shows excellent antegrade flow through splenic and portal veins, no extravasation or other  apparent complication. Catheters and sheaths were removed from the patient.Hemostasis was achieved at the right femoral access site with manual compression. Hemostasis was achieved at the right IJ access site  with manual compression. A dressing was placed. The patient tolerated the procedure well. Patient transferred to the PACU. IMPRESSION: 1. Technically successful TIPS creation. 2. Coil embolization of dominant splenic and portal collateral venous channels supplying esophageal varices. 3. Ultrasound guided paracentesis removing 3 L clear yellow ascites. Electronically Signed   By: Lucrezia Europe M.D.   On: 01/31/2022 09:07   IR EMBO VENOUS NOT HEMORR HEMANG  INC GUIDE ROADMAPPING  Result Date: 01/30/2022 CLINICAL DATA:  Cirrhosis, portal venous hypertension, GI bleed with esophageal varices post banding, incompletely controlled. Hemodynamic instability. EXAM: 1. TIPS CREATION 2. COIL EMBOLIZATION OF VENOUS VARICES 3. ULTRASOUND-GUIDED VENOUS ACCESS 4. PARACENTESIS WITH ULTRASOUND GUIDANCE ANESTHESIA/SEDATION: General - as administered by the Anesthesia department MEDICATIONS: Per anesthesia department FLUOROSCOPY TIME:  Radiation Exposure Index (as provided by the fluoroscopic device): Q000111Q mGy Kerma COMPLICATIONS: None immediate. PROCEDURE: Informed written consent was obtained from the family after a thorough discussion of the procedural risks, benefits and alternatives. All questions were addressed. See previous consultation. Maximal Sterile Barrier Technique was utilized including caps, mask, sterile gowns, sterile gloves, sterile drape, hand hygiene and skin antiseptic. A timeout was performed prior to the initiation of the procedure. The skin overlying the right upper abdominal quadrant as well as the right neck and femoral region were prepped and draped in usual sterile fashion. Initial ultrasound scanning demonstrates a moderate amount of recurrent  ascites. Under ultrasound guidance, a 6 [place]  Safe-T-Centesis needle was advanced into the peritoneal cavity from a right lateral approach for paracentesis removing 3 L clear yellow ascites. Under direct ultrasound guidance, patency of the right common femoral vein was identified and documented. Micropuncture access under ultrasound guidance was achieved. Dilator exchanged over guidewire for 8 French vascular sheath, through which the ICE ultrasound probe was advanced into the central aspect of the IVC. Next, the right internal jugular vein patency was confirmed and documented with ultrasound, then the vein was accessed under direct ultrasound guidance with micropuncture set. This allowed for placement of the 10 French TIPS vascular sheath. 5 French angled angiographic catheter was advanced initially to the right hepatic vein and then into the middle hepatic vein under fluoroscopic and ultrasound guidance. Catheter exchanged over guidewire for a ScorpionT TIPS access set, advanced into the central aspect of right portal vein. Angled Glidewire was advanced into the portal vein. The access set was advanced sequentially into the portal vein. This was exchanged over guidewire for a pigtail measuring catheter. Portal venogram was performed demonstrating patency, slow antegrade flow through the main and right portal vein with limited opacification of the left portal vein. Large collaterals to esophageal varices were identified from the proximal portal vein. Pigtail catheter exchanged over guidewire. Tract was dilated with Conquest angioplasty balloon. The 10 French sheath was advanced into the portal vein. Next, a 2 cm (un covered) x 8 cm (covered) x 10 mm diameter GORE VIATORR TIPS Endoprosthesis was advanced through the sheath across the intra hepatic track and deployed. The TIPS stent was angioplastied in multiple stations to 10 mm diameter. Follow-up portal venogram demonstrated good stent deployment and flow, no residual stenosis. No further antegrade flow into  the right left portal vein branches evident. Persistent opacification of large collateral channel supplying the esophageal varices. Attention then turned to variceal embolization. Dominant varices from the proximal portal vein and mid splenic vein were occluded with a total of 16 interlock and Ruby coils, to virtual cessation of flow through the channels. Final venogram from the peripheral splenic vein  shows excellent antegrade flow through splenic and portal veins, no extravasation or other apparent complication. Catheters and sheaths were removed from the patient.Hemostasis was achieved at the right femoral access site with manual compression. Hemostasis was achieved at the right IJ access site with manual compression. A dressing was placed. The patient tolerated the procedure well. Patient transferred to the PACU. IMPRESSION: 1. Technically successful TIPS creation. 2. Coil embolization of dominant splenic and portal collateral venous channels supplying esophageal varices. 3. Ultrasound guided paracentesis removing 3 L clear yellow ascites. Electronically Signed   By: Lucrezia Europe M.D.   On: 02/17/2022 09:07   CT Angio Abd/Pel w/ and/or w/o  Result Date: 02/16/2022 CLINICAL DATA:  GI bleed. EXAM: CTA ABDOMEN AND PELVIS WITHOUT AND WITH CONTRAST TECHNIQUE: Multidetector CT imaging of the abdomen and pelvis was performed using the standard protocol during bolus administration of intravenous contrast. Multiplanar reconstructed images and MIPs were obtained and reviewed to evaluate the vascular anatomy. RADIATION DOSE REDUCTION: This exam was performed according to the departmental dose-optimization program which includes automated exposure control, adjustment of the mA and/or kV according to patient size and/or use of iterative reconstruction technique. CONTRAST:  189mL OMNIPAQUE IOHEXOL 350 MG/ML SOLN COMPARISON:  None. FINDINGS: VASCULAR Aorta: Age advanced atherosclerotic changes involving the aorta but no  aneurysm dissection. Celiac: Of separate origins of the hepatic and splenic arteries. Both vessels are patent. SMA: Normal Renals: Normal IMA: Patent Inflow: Age advanced atherosclerotic calcifications but no aneurysm or dissection or occlusion. Proximal Outflow: Patent Veins: Unremarkable. Review of the MIP images confirms the above findings. NON-VASCULAR Lower chest: Bilateral pleural effusions and overlying atelectasis. The heart is within normal limits in size. Moderate-sized hiatal hernia. Paraesophageal varices. Hepatobiliary: Cirrhotic changes involving the liver but no arterial phase enhancing lesions to suggest hepatoma. No intrahepatic biliary dilatation. The gallbladder is mildly distended and contains some layering sludge or gallstones. No common bile duct dilatation. Pancreas: No mass, inflammation or ductal dilatation. Spleen: Normal size. No focal lesions. The splenic vein is patent. Small focus of thrombus is noted in the portal vein but no occlusive clot. Adrenals/Urinary Tract: The adrenal glands are unremarkable. Left renal calculus and small scattered renal cysts but no worrisome renal lesions or hydronephrosis. The bladder contains a Foley catheter. Stomach/Bowel: The stomach is moderately distended. Do not see any evidence of active contrast extravasation to suggest hemorrhage. The duodenum, small bowel and colon are grossly normal. No evidence of active extravasation to account for an active GI bleed. No obstructive findings. Lymphatic: Upper abdominal lymph nodes typical with cirrhosis. No retroperitoneal mass or adenopathy. Reproductive: The prostate gland and seminal vesicles are unremarkable. Other: Large volume abdominal/pelvic ascites and diffuse and fairly marked body wall edema. Musculoskeletal: No significant bony findings. IMPRESSION: 1. Age advanced atherosclerotic changes involving the aorta and branch vessels. No aneurysm or dissection. 2. No evidence of active contrast  extravasation to account for an active GI bleed. 3. Advanced cirrhotic changes involving the liver with portal venous hypertension, portal venous collaterals, paraesophageal varices and large volume abdominal/pelvic ascites. 4. No worrisome arterial phase enhancing lesions to suggest hepatoma. 5. Bilateral pleural effusions and overlying atelectasis. 6. Small focus of thrombus in the portal vein but no occlusive clot. Aortic Atherosclerosis (ICD10-I70.0). Electronically Signed   By: Marijo Sanes M.D.   On: 02/20/2022 18:12   IR Paracentesis  Result Date: 01/29/2022 CLINICAL DATA:  Cirrhosis, portal venous hypertension, GI bleed with esophageal varices post banding, incompletely controlled. Hemodynamic instability. EXAM: 1. TIPS  CREATION 2. COIL EMBOLIZATION OF VENOUS VARICES 3. ULTRASOUND-GUIDED VENOUS ACCESS 4. PARACENTESIS WITH ULTRASOUND GUIDANCE ANESTHESIA/SEDATION: General - as administered by the Anesthesia department MEDICATIONS: Per anesthesia department FLUOROSCOPY TIME:  Radiation Exposure Index (as provided by the fluoroscopic device): 1360 mGy Kerma COMPLICATIONS: None immediate. PROCEDURE: Informed written consent was obtained from the family after a thorough discussion of the procedural risks, benefits and alternatives. All questions were addressed. See previous consultation. Maximal Sterile Barrier Technique was utilized including caps, mask, sterile gowns, sterile gloves, sterile drape, hand hygiene and skin antiseptic. A timeout was performed prior to the initiation of the procedure. The skin overlying the right upper abdominal quadrant as well as the right neck and femoral region were prepped and draped in usual sterile fashion. Initial ultrasound scanning demonstrates a moderate amount of recurrent  ascites. Under ultrasound guidance, a 6 [place] Safe-T-Centesis needle was advanced into the peritoneal cavity from a right lateral approach for paracentesis removing 3 L clear yellow ascites.  Under direct ultrasound guidance, patency of the right common femoral vein was identified and documented. Micropuncture access under ultrasound guidance was achieved. Dilator exchanged over guidewire for 8 French vascular sheath, through which the ICE ultrasound probe was advanced into the central aspect of the IVC. Next, the right internal jugular vein patency was confirmed and documented with ultrasound, then the vein was accessed under direct ultrasound guidance with micropuncture set. This allowed for placement of the 10 French TIPS vascular sheath. 5 French angled angiographic catheter was advanced initially to the right hepatic vein and then into the middle hepatic vein under fluoroscopic and ultrasound guidance. Catheter exchanged over guidewire for a ScorpionT TIPS access set, advanced into the central aspect of right portal vein. Angled Glidewire was advanced into the portal vein. The access set was advanced sequentially into the portal vein. This was exchanged over guidewire for a pigtail measuring catheter. Portal venogram was performed demonstrating patency, slow antegrade flow through the main and right portal vein with limited opacification of the left portal vein. Large collaterals to esophageal varices were identified from the proximal portal vein. Pigtail catheter exchanged over guidewire. Tract was dilated with Conquest angioplasty balloon. The 10 French sheath was advanced into the portal vein. Next, a 2 cm (un covered) x 8 cm (covered) x 10 mm diameter GORE VIATORR TIPS Endoprosthesis was advanced through the sheath across the intra hepatic track and deployed. The TIPS stent was angioplastied in multiple stations to 10 mm diameter. Follow-up portal venogram demonstrated good stent deployment and flow, no residual stenosis. No further antegrade flow into the right left portal vein branches evident. Persistent opacification of large collateral channel supplying the esophageal varices. Attention  then turned to variceal embolization. Dominant varices from the proximal portal vein and mid splenic vein were occluded with a total of 16 interlock and Ruby coils, to virtual cessation of flow through the channels. Final venogram from the peripheral splenic vein shows excellent antegrade flow through splenic and portal veins, no extravasation or other apparent complication. Catheters and sheaths were removed from the patient.Hemostasis was achieved at the right femoral access site with manual compression. Hemostasis was achieved at the right IJ access site with manual compression. A dressing was placed. The patient tolerated the procedure well. Patient transferred to the PACU. IMPRESSION: 1. Technically successful TIPS creation. 2. Coil embolization of dominant splenic and portal collateral venous channels supplying esophageal varices. 3. Ultrasound guided paracentesis removing 3 L clear yellow ascites. Electronically Signed   By: Corlis Leak  M.D.   On: 02/09/2022 09:07    Labs:  CBC: Recent Labs    02/12/2022 0118 02/10/2022 0327 02/09/2022 1000 02/23/22 0636  WBC 11.8* 11.6* 12.1* 13.7*  HGB 7.9* 8.0* 11.1* 10.2*  HCT 22.7* 22.9* 30.2* 28.2*  PLT 30*   30* 76* 58* 61*    COAGS: Recent Labs    02/20/22 0635 02/02/2022 0042 01/28/2022 2145 02/18/2022 0118 02/18/2022 1000  INR 1.5* 1.4* 1.5* 1.8* 1.3*  APTT 44* 40*  --  52* 42*    BMP: Recent Labs    01/29/2022 2146 02/08/2022 2243 02/08/2022 0118 01/27/2022 1000 02/23/22 0339  NA 136 137 137 138 138  K 3.9 3.9 3.8 3.8 3.6  CL 105  --  105 107 108  CO2 23  --  23 25 25   GLUCOSE 154*  --  163* 136* 113*  BUN 17  --  17 18 24*  CALCIUM 7.5*  --  7.2* 8.8* 7.8*  CREATININE 0.87  --  0.84 0.83 0.94  GFRNONAA >60  --  >60 >60 >60    LIVER FUNCTION TESTS: Recent Labs    02/18/2022 0042 02/01/2022 2146 01/28/2022 0118 02/04/2022 1000  BILITOT 6.0* 4.9* 4.5* 7.2*  AST 93* 41 32 45*  ALT 55* 21 16 21   ALKPHOS 106 39 30* 35*  PROT 5.6* 3.4* 3.1*  3.4*  ALBUMIN 2.2* 1.9* 2.0* 2.0*    Assessment and Plan: Esophageal varices s/p TIPS procedure with embolization of splenic and portal tributaries supplying varices.  Patient remains intubated/sedated.  Experienced further hemorrhage yesterday morning with additional MTP x1 (60u PRBCs since admission).  EGD performed at bedside by GI showed esophageal varices were decompressed. Fresh BRB was found, however no focal source of bleeding identified.   Patient currently stabilized with medical management including transfusions and pressors.  Procedure sites currently intact.  Small amount of oozing and leakage of ascites from abdominal sites.  Reviewed case with Dr. Serafina Royals.  If patient rebleeds could potentially assess TIPS or assess for further targets for embolization however note patient is DNR and agree with current management per primary team given severe critical illness. Repeat INR ordered.  NH3 78, on lactulose.   Electronically Signed: Docia Barrier, PA 02/23/2022, 9:57 AM   I spent a total of 15 Minutes at the the patient's bedside AND on the patient's hospital floor or unit, greater than 50% of which was counseling/coordinating care for esophageal varices.

## 2022-02-23 NOTE — Progress Notes (Signed)
? ? ?  Not bleeding that we know. ?Weaning pressors and sedation. ?Continue aggressive supportive care. ? ?We will follow loosely as have no care input at this time defer to PCCM ? ?CBC Latest Ref Rng & Units 02/23/2022 02/03/2022 02/20/2022  ?WBC 4.0 - 10.5 K/uL 13.7(H) 12.1(H) 11.6(H)  ?Hemoglobin 13.0 - 17.0 g/dL 10.2(L) 11.1(L) 8.0(L)  ?Hematocrit 39.0 - 52.0 % 28.2(L) 30.2(L) 22.9(L)  ?Platelets 150 - 400 K/uL 61(L) 58(L) 76(L)  ? ?Iva Boop, MD, Clementeen Graham ?[facility] ?02/23/2022 3:10 PM ? ?

## 2022-02-23 NOTE — Progress Notes (Addendum)
PT Cancellation Note ? ?Patient Details ?Name: Harold Howard ?MRN: [id number] ?DOB: 1981/05/18 ? ? ?Cancelled Treatment:    Reason Eval/Treat Not Completed: (P) Medical issues which prohibited therapy Due to pt's continued decline in status PT is signing off. Please reorder PT if pt status improves. ? ?Consuello Lassalle B. Migdalia Dk PT, DPT ?[facility] ?Pager [phone] ?Office [phone] ? ? ? ? ?[place] ?02/23/2022, 1:08 PM ? ? ?

## 2022-02-23 NOTE — Plan of Care (Signed)
?  Interdisciplinary Goals of Care Family Meeting ? ? ?Date carried out: 02/23/2022 ? ?Location of the meeting: Unit ? ?Member's involved: Nurse Practitioner and Family Member or next of kin ? ?Durable Power of Insurance risk surveyor:  ?Has now been identified as patient son Harold Howard. He is patient son and therefore will be deemed as patient legal decision maker ? ?Discussion: We discussed goals of care for Harold Howard .  [name] was accompanied by his grandmother, aunt, and fiance. All family members were updated regarding his clinical status and recent hospital events. They are in agreement with the decision to make patient a DNR.  ? ?Code status: Full DNR ? ?Disposition: Continue current acute care ? ?Time spent for the meeting: 32 ? ?[name] D. Harris ?02/23/2022, 9:52 AM ? ?

## 2022-02-23 NOTE — Progress Notes (Addendum)
? ?NAME:  Harold Howard, MRN:  [id number], DOB:  04/06/1981, LOS: 10 ?ADMISSION DATE:  02/18/2022, CONSULTATION DATE:  02/20/2022 ?REFERRING MD:  [facility]-EDP, CHIEF COMPLAINT:  hemorrhagic shock  ? ?History of Present Illness:  ?Mr. Armwood is a 41 y/o gentleman with a history of daily ETOH use who presents with 1 month of vomiting blood and dark stools consistent with upper GI bleed s/p EGD on 2/19 with banding of esophageal varices.  ? ?History is provided by the ED and his step sister as he is intubated and sedated. His family is estranged due to his ETOH consumption. His step-sister indicated she is the only family he is in contact with. He drinks about a fifth per day, but has been working on cutting back recently, but he still drinks about a half gallon of liquor every 2-3 days. He came home from work early yesterday after texting his roommate a picture of a toilet bowl full of BRB. He was found on the floor this morning screaming to call 911. No emesis with EMS. Received 500cc crystalloid and zofran. He was hypotensive and developed worsening encephalopathy in the ED and required intubation. He had a seizure in the ED.  He received 8 pRBCs. PCCM consulted for admission. GI consulted by the ED. ? ?No known use of NSAIDs or anticoagulants. His step-sister is not clear on his medical history. ? ?Pertinent  Medical History  ?ETOH abuse ? ?Significant Hospital Events: ?Including procedures, antibiotic start and stop dates in addition to other pertinent events   ?2/19 admission, intubated sedated, EGD  ?2/20 paracentesis  ?2/22 patient self-extubated, off pressors  ?2/27 Recurrent upper GI bleed on the floor, stabilized and transferred back to the ICU. GI notified  ?2/28 Underwent MTP twice afternoon of 2/27 he had TIPS with embolectomy but unfortunately re-bleed post TIPS  ?3/1 no further severe bleeding episodes since early am of 2/28 ? ?Procedures:  ?2/19 EGD: EGD yesterday with large esophageal varices, suspected  gastric varices, and portal hypertensive gastropathy  ?2/27 Upper EGD with large varices unable to identify bleeding site  ?2/27 TIPS with embolization of splenic and portal tirbutaries to varices  ?2/28 Upper EGD with Red blood and significant clot burden was found in the stomach, visualization was quite poor. Pylorus reached. No clear source of active bleeding coming from the stomach ? ?Antibiotics:  ?2/19 Ceftriaxone for SBP prophylaxis  ? ?Interim History / Subjective:  ?Remains deeply sedated on vent  ? ?Objective   ?Blood pressure (!) 101/58, pulse 68, temperature 98.2 ?F (36.8 ?C), temperature source Axillary, resp. rate 20, height 5\' 9"  (1.753 m), weight 103.7 kg, SpO2 96 %. ?   ?Vent Mode: PRVC ?FiO2 (%):  [45 %-60 %] 45 % ?Set Rate:  [20 bmp] 20 bmp ?Vt Set:  [560 mL] 560 mL ?PEEP:  [5 cmH20] 5 cmH20 ?Plateau Pressure:  [21 cmH20-23 cmH20] 21 cmH20  ? ?Intake/Output Summary (Last 24 hours) at 02/23/2022 0717 ?Last data filed at 02/23/2022 0600 ?Gross per 24 hour  ?Intake 2293.15 ml  ?Output 935 ml  ?Net 1358.15 ml  ? ? ?Filed Weights  ? March 15, 2022 0500 02/04/2022 0441 02/23/22 0336  ?Weight: 92.5 kg 103.7 kg 103.7 kg  ? ? ?Examination: ?General: Acute on chronically ill appearing adult male lying in bed on mechanical ventilation, in NAD ?HEENT: ETT, MM pink/moist, PERRL,  ?Neuro: Sedated on vent  ?CV: s1s2 regular rate and rhythm, no murmur, rubs, or gallops,  ?PULM:  Course breath, tolerating vent, no increased  work of breathing  ?GI: soft, bowel sounds active in all 4 quadrants, non-tender, non-distended ?Extremities: warm/dry, no edema  ?Skin: no rashes or lesions ? ?Resolved Hospital Problem list   ?Hypocalcemia  ?Severe metabolic acidosis ?Hemorrhagic shock  ?Hyperkalemia  ? ?Assessment & Plan:  ? ?Acute on subacute GIB, recurrent as of 2/27 ?-Patient began to rebleed morning of 2/27 due to degree of massive bleed massive transfusion protocol (MTP) was initiated by afternoon of 2/27.  Post MTP patient  stabilized slightly and was able to undergo TIPS with embolization.  Unfortunately after TIPS patient again rebleed resulting in another MTP protocol initiated.  Patient has received over 60 units of PRBC. AS of 0530 2/28 patient has not shown further severe signs of bleeding  ?Esophageal Varices s/p banding x11 ?Recurrent hemorraghic shock  ?P: ?GI and IR following, appreciate assistance  ?Trend CBC  ?Begin to diurese today  ?Continue to wean pressors as able  ? ?ETOH abuse  ?Decompensated cirrhosis  ?-Ammonia downtrending to 44. ?Hyperbilirubinemia and elevated transaminase  ?-Due to hypoperfusion, ETOH abuse.  ?-2/19 RUQ [place]: cirrhotic hepatic morphology, abdominopelvic ascites, no gallstones or biliary dilation, bidirectional flow is suspected in the main portal vein with varicosities at the hepatic hilum.  ?-MELD score 24.  ?-Paracentesis on 2/20 not consistent with SBP. SAAG is 1 g/dl. Liver work up negative thus far and etiology of cirrhosis is likely secondary to alcohol use. Peritoneal fluid culture showed no growth. ?P: ?GI following, appreciate assistance  ?Lactulose has been on hold due to bleed, check ammonia level today  ?Continue to supplement thiamine, folate, and MVI  ?Supportive care  ?S/P phenobarbital taper  ? ?At risk for recurrent hypoxic respiratory failure given massive hematemesis in the setting of upper GI bleed  ?P: ?Continue ventilator support with lung protective strategies  ?Wean PEEP and FiO2 for sats greater than 90%. ?Head of bed elevated 30 degrees. ?Plateau pressures less than 30 cm H20.  ?Follow intermittent chest x-ray and ABG.   ?SAT/SBT as tolerated, mentation preclude extubation  ?Ensure adequate pulmonary hygiene  ?Follow cultures  ?VAP bundle in place  ?PAD protocol ?Start to lighten sedation today  ? ?Massive volume overload in the setting of multiple transfusions ?-Currently 15 liters positive  ?P: ?Start to diurese today  ?Strict intake and output  ? ?Thrombocytopenia   ?Iron deficiency  ?-Ferritin 22 ?P: ?Trend CBC  ?Transfuse as needed  ? ?Seizure ?-Likely due to profound hypoperfusion and hypoxia ?-Head CT on 2/19 showed no acute intracranial pathology.  ?P: ?Management per neurology  ?Maintain neuro protective measures ?Nutrition and bowel regiment  ?Seizure precautions  ?Aspirations precautions  ? ?Best Practice (right click and "Reselect all SmartList Selections" daily)  ? ?Diet/type: NPO ?DVT prophylaxis: prophylactic heparin  ?GI prophylaxis: PPI ?Lines: N/A   ?Foley:  N/A ?Code Status:  DNR ?Last date of multidisciplinary goals of care discussion [ with step sister on 2/19] ? ?Critical care:  ?CRITICAL CARE ?Performed by: Rochella Benner D. Harris ? ?Total critical care time: 37 minutes ? ?Critical care time was exclusive of separately billable procedures and treating other patients. ? ?Critical care was necessary to treat or prevent imminent or life-threatening deterioration. ? ?Critical care was time spent personally by me on the following activities: development of treatment plan with patient and/or surrogate as well as nursing, discussions with consultants, evaluation of patient's response to treatment, examination of patient, obtaining history from patient or surrogate, ordering and performing treatments and interventions, ordering and review of laboratory  studies, ordering and review of radiographic studies, pulse oximetry and re-evaluation of patient's condition. ? ?Tewana Bohlen D. Harris, NP-C ?[facility] ?Personal contact information can be found on Amion  ?02/23/2022, 7:17 AM ? ? ?

## 2022-02-23 DEATH — deceased

## 2022-02-24 ENCOUNTER — Inpatient Hospital Stay (HOSPITAL_COMMUNITY): Payer: Self-pay

## 2022-02-24 DIAGNOSIS — D696 Thrombocytopenia, unspecified: Secondary | ICD-10-CM

## 2022-02-24 DIAGNOSIS — R0689 Other abnormalities of breathing: Secondary | ICD-10-CM

## 2022-02-24 LAB — GLUCOSE, CAPILLARY
Glucose-Capillary: 81 mg/dL (ref 70–99)
Glucose-Capillary: 90 mg/dL (ref 70–99)
Glucose-Capillary: 90 mg/dL (ref 70–99)
Glucose-Capillary: 96 mg/dL (ref 70–99)
Glucose-Capillary: 97 mg/dL (ref 70–99)
Glucose-Capillary: 99 mg/dL (ref 70–99)

## 2022-02-24 LAB — BASIC METABOLIC PANEL
Anion gap: 8 (ref 5–15)
BUN: 28 mg/dL — ABNORMAL HIGH (ref 6–20)
CO2: 24 mmol/L (ref 22–32)
Calcium: 7.9 mg/dL — ABNORMAL LOW (ref 8.9–10.3)
Chloride: 106 mmol/L (ref 98–111)
Creatinine, Ser: 1.1 mg/dL (ref 0.61–1.24)
GFR, Estimated: >60 mL/min (ref >60)
Glucose, Bld: 107 mg/dL — ABNORMAL HIGH (ref 70–99)
Potassium: 3.8 mmol/L (ref 3.5–5.1)
Sodium: 138 mmol/L (ref 135–145)

## 2022-02-24 LAB — HEPATIC FUNCTION PANEL
ALT: 30 U/L (ref 0–44)
AST: 74 U/L — ABNORMAL HIGH (ref 15–41)
Albumin: 1.9 g/dL — ABNORMAL LOW (ref 3.5–5.0)
Alkaline Phosphatase: 67 U/L (ref 38–126)
Bilirubin, Direct: 1.7 mg/dL — ABNORMAL HIGH (ref 0.0–0.2)
Indirect Bilirubin: 3.8 mg/dL — ABNORMAL HIGH (ref 0.3–0.9)
Total Bilirubin: 5.5 mg/dL — ABNORMAL HIGH (ref 0.3–1.2)
Total Protein: 3.9 g/dL — ABNORMAL LOW (ref 6.5–8.1)

## 2022-02-24 LAB — HEMOGLOBIN AND HEMATOCRIT, BLOOD
HCT: 31.9 % — ABNORMAL LOW (ref 39.0–52.0)
Hemoglobin: 10.9 g/dL — ABNORMAL LOW (ref 13.0–17.0)

## 2022-02-24 LAB — MAGNESIUM: Magnesium: 1.7 mg/dL (ref 1.7–2.4)

## 2022-02-24 MED ORDER — THIAMINE HCL 100 MG PO TABS
100.0000 mg | ORAL_TABLET | Freq: Every day | ORAL | Status: DC
  Administered 2022-02-25 – 2022-02-26 (×2): 100 mg
  Filled 2022-02-24 (×2): qty 1

## 2022-02-24 MED ORDER — RIFAXIMIN 550 MG PO TABS
550.0000 mg | ORAL_TABLET | Freq: Two times a day (BID) | ORAL | Status: DC
  Administered 2022-02-24 – 2022-02-26 (×5): 550 mg
  Filled 2022-02-24 (×5): qty 1

## 2022-02-24 MED ORDER — VITAL 1.5 CAL PO LIQD
1000.0000 mL | ORAL | Status: DC
  Administered 2022-02-24 – 2022-02-26 (×3): 1000 mL

## 2022-02-24 MED ORDER — MAGNESIUM SULFATE 2 GM/50ML IV SOLN
2.0000 g | Freq: Once | INTRAVENOUS | Status: AC
  Administered 2022-02-24: 2 g via INTRAVENOUS
  Filled 2022-02-24: qty 50

## 2022-02-24 MED ORDER — LACTULOSE 10 GM/15ML PO SOLN
20.0000 g | Freq: Two times a day (BID) | ORAL | Status: DC
  Administered 2022-02-24 – 2022-02-26 (×4): 20 g
  Filled 2022-02-24 (×4): qty 30

## 2022-02-24 MED ORDER — ADULT MULTIVITAMIN W/MINERALS CH
1.0000 | ORAL_TABLET | Freq: Every day | ORAL | Status: DC
  Administered 2022-02-24 – 2022-02-26 (×3): 1
  Filled 2022-02-24 (×3): qty 1

## 2022-02-24 MED ORDER — POTASSIUM CHLORIDE 10 MEQ/50ML IV SOLN
10.0000 meq | INTRAVENOUS | Status: AC
  Administered 2022-02-24 (×4): 10 meq via INTRAVENOUS
  Filled 2022-02-24 (×4): qty 50

## 2022-02-24 MED ORDER — POLYETHYLENE GLYCOL 3350 17 G PO PACK
17.0000 g | PACK | Freq: Every day | ORAL | Status: DC
  Administered 2022-02-26: 17 g
  Filled 2022-02-24: qty 1

## 2022-02-24 MED ORDER — FUROSEMIDE 10 MG/ML IJ SOLN
80.0000 mg | Freq: Two times a day (BID) | INTRAMUSCULAR | Status: AC
  Administered 2022-02-24 (×2): 80 mg via INTRAVENOUS
  Filled 2022-02-24 (×2): qty 8

## 2022-02-24 MED ORDER — POLYSACCHARIDE IRON COMPLEX 150 MG PO CAPS
150.0000 mg | ORAL_CAPSULE | Freq: Every day | ORAL | Status: DC
  Administered 2022-02-24 – 2022-02-26 (×3): 150 mg
  Filled 2022-02-24 (×3): qty 1

## 2022-02-24 MED ORDER — PROSOURCE TF PO LIQD
45.0000 mL | Freq: Two times a day (BID) | ORAL | Status: DC
  Administered 2022-02-24 – 2022-02-26 (×5): 45 mL
  Filled 2022-02-24 (×5): qty 45

## 2022-02-24 MED ORDER — FOLIC ACID 1 MG PO TABS
1.0000 mg | ORAL_TABLET | Freq: Every day | ORAL | Status: DC
  Administered 2022-02-25 – 2022-02-26 (×2): 1 mg
  Filled 2022-02-24 (×2): qty 1

## 2022-02-24 MED ORDER — DOCUSATE SODIUM 50 MG/5ML PO LIQD
100.0000 mg | Freq: Two times a day (BID) | ORAL | Status: DC
Start: 2022-02-24 — End: 2022-02-27
  Administered 2022-02-25 – 2022-02-26 (×2): 100 mg
  Filled 2022-02-24 (×3): qty 10

## 2022-02-24 NOTE — TOC Progression Note (Signed)
Transition of Care (TOC) - Progression Note  ? ? ?Patient Details  ?Name: Harold Howard ?MRN: [id number] ?Date of Birth: 1981/11/04 ? ?Transition of Care (TOC) CM/SW Contact  ?Beckie Busing, RN ?Phone Number:[phone] ? ?02/24/2022, 4:15 PM ? ?Clinical Narrative:    ?TOC continues to follow patient with multiple medical problems. Patient is not medically stable for d/c planning. TOC will continue to follow.  ? ? ?  ?Barriers to Discharge: Continued Medical Work up ? ?Expected Discharge Plan and Services ?  ?  ?Discharge Planning Services: CM Consult ?  ?Living arrangements for the past 2 months: Single Family Home ?                ?  ?  ?  ?  ?  ?  ?  ?  ?  ?  ? ? ?Social Determinants of Health (SDOH) Interventions ?  ? ?Readmission Risk Interventions ?No flowsheet data found. ? ?

## 2022-02-24 NOTE — Progress Notes (Signed)
Interventional Radiology Brief Note: ? ?Patient s/p TIPS procedure with embolization of splenic and portal tributaries supplying varices 2/27 by Dr. Deanne Coffer.   ? ?Chart check today shows patient is stabilizing.  He has been able to wean from pressors.  Remain intubated with weaning of sedation as able.  ?No further bleeding.  ? ?HgB stable at 10.9 ?INR 1.4 yesterday ?Ammonia 78 yesterday ? ?Ordered hepatic function panel.  ? ?Loyce Dys, MS RD PA-C ? ? ?

## 2022-02-24 NOTE — Progress Notes (Signed)
? ?  Pressors weaned. ? ?CBC Latest Ref Rng & Units 02/23/2022 01/31/2022 02/19/2022  ?WBC 4.0 - 10.5 K/uL 13.7(H) 12.1(H) 11.6(H)  ?Hemoglobin 13.0 - 17.0 g/dL 10.2(L) 11.1(L) 8.0(L)  ?Hematocrit 39.0 - 52.0 % 28.2(L) 30.2(L) 22.9(L)  ?Platelets 150 - 400 K/uL 61(L) 58(L) 76(L)  ? ? ?No bleeding. ? ?Weaning sedation. ? ?Progress overall. ? ?Following along - no care input from me at this time. ? ?Iva Boop, MD, Clementeen Graham ?[facility] ?02/24/2022 9:49 AM ? ?

## 2022-02-24 NOTE — Progress Notes (Signed)
OT Cancellation Note ? ?Patient Details ?Name: Harold Howard ?MRN: [id number] ?DOB: Oct 31, 1981 ? ? ?Cancelled Treatment:    Reason Eval/Treat Not Completed: Medical issues which prohibited therapy Noted pt still medically tenuous. Contacted MD - will sign off for OT at acute level with MD to reconsult if pt status improves. ? ?Layla Maw ?02/24/2022, 9:37 AM ?

## 2022-02-24 NOTE — Progress Notes (Addendum)
Nutrition Follow-up ? ?DOCUMENTATION CODES:  ? ?Non-severe (moderate) malnutrition in context of social or environmental circumstances ? ?INTERVENTION:  ? ?Initiate TF via OG tube: ?Vital 1.5 begin at 20 ml/h, increase by 10 ml every 8 hours to goal rate of 60 ml/h. ?Prosource TF 45 ml BID. ?Provides 2240 kcal, 119 gm protein, 1100 ml free water daily.  ? ?Monitor magnesium, potassium, and phosphorus BID for at least 3 days, MD to replete as needed, as pt is at risk for refeeding syndrome given hx of ETOH abuse and minimal intake since admission. ? ?NUTRITION DIAGNOSIS:  ? ?Moderate Malnutrition related to social / environmental circumstances (EtOH abuse) as evidenced by mild fat depletion, moderate muscle depletion. ? ?Ongoing  ? ?GOAL:  ? ?Patient will meet greater than or equal to 90% of their needs ? ?Currently unmet ? ?MONITOR:  ? ?PO intake, Supplement acceptance, Labs, Weight trends, I & O's ? ?REASON FOR ASSESSMENT:  ? ?Ventilator ?  ? ?ASSESSMENT:  ? ?41 year old male who presented to the ED on 2/19 with hematemesis and dark stools x 1 month. Pt had a seizure in the ED and required intubation for airway protection. PMH of EtOH abuse. Pt admitted with hemorrhagic shock, GI bleed, AKI. ? ?Discussed patient in ICU rounds and with RN today. ?OG tube was placed by NP this morning. ?MD okay with starting TF today, RD to order.  ?S/P TIPS 2/27, then developed recurrent hematemesis, so EGD was performed. Blood and clot found in the esophagus, but no active bleeding from post banding ulcers was found.  ?GI has signed off.  ? ?Patient remains intubated on ventilator support ?MV: 12.9 L/min ?Temp (24hrs), Avg:98.3 ?F (36.8 ?C), Min:97.8 ?F (36.6 ?C), Max:98.7 ?F (37.1 ?C) ? ? ?Labs reviewed.  ?CBG: 81-97 ? ?Medications reviewed and include Colace, folic acid, Lasix, Niferex, lactulose, MVI with minerals, Protonix, Miralax, thiamine, KCl. ? ?Admission weight 76 kg ?Current weight 97.8 kg ?I/O +12.6 L since  admission ? ?Diet Order:   ?Diet Order   ? ?       ?  Diet NPO time specified  Diet effective now       ?  ? ?  ?  ? ?  ? ? ?EDUCATION NEEDS:  ? ?Education needs have been addressed ? ?Skin:  Skin Assessment: Reviewed RN Assessment ? ?Last BM:  3/2 type 7 rectal tube ? ?Height:  ? ?Ht Readings from Last 1 Encounters:  ?01/27/2022 5\' 9"  (1.753 m)  ? ? ?Weight:  ? ?Wt Readings from Last 1 Encounters:  ?02/24/22 97.8 kg  ? ? ?BMI:  Body mass index is 31.84 kg/m?. ? ?Estimated Nutritional Needs:  ? ?Kcal:  2100-2300 ? ?Protein:  105-120 grams ? ?Fluid:  2.0 L/day ? ? ? ?Lucas Mallow RD, LDN, CNSC ?Please refer to Amion for contact information.                                                       ? ?

## 2022-02-24 NOTE — Progress Notes (Signed)
? ?NAME:  Harold Howard, MRN:  [id number], DOB:  [birth year], LOS: 11 ?ADMISSION DATE:  01/28/2022, CONSULTATION DATE:  01/28/2022 ?REFERRING MD:  Horton-EDP, CHIEF COMPLAINT:  hemorrhagic shock  ? ?History of Present Illness:  ?Harold Howard is a 41 y/o gentleman with a history of daily ETOH use who presents with 1 month of vomiting blood and dark stools consistent with upper GI bleed s/p EGD on 2/19 with banding of esophageal varices.  ? ?History is provided by the ED and his step sister as he is intubated and sedated. His family is estranged due to his ETOH consumption. His step-sister indicated she is the only family he is in contact with. He drinks about a fifth per day, but has been working on cutting back recently, but he still drinks about a half gallon of liquor every 2-3 days. He came home from work early yesterday after texting his roommate a picture of a toilet bowl full of BRB. He was found on the floor this morning screaming to call 911. No emesis with EMS. Received 500cc crystalloid and zofran. He was hypotensive and developed worsening encephalopathy in the ED and required intubation. He had a seizure in the ED.  He received 8 pRBCs. PCCM consulted for admission. GI consulted by the ED. ? ?No known use of NSAIDs or anticoagulants. His step-sister is not clear on his medical history. ? ?Pertinent  Medical History  ?ETOH abuse ? ?Significant Hospital Events: ?Including procedures, antibiotic start and stop dates in addition to other pertinent events   ?2/19 admission, intubated sedated, EGD  ?2/20 paracentesis  ?2/22 patient self-extubated, off pressors  ?2/27 Recurrent upper GI bleed on the floor, stabilized and transferred back to the ICU. GI notified  ?2/28 Underwent MTP twice afternoon of 2/27 he had TIPS with embolectomy but unfortunately re-bleed post TIPS  ?3/1 no further severe bleeding episodes since early am of 2/28 ? ?Procedures:  ?2/19 EGD: EGD yesterday with large esophageal varices, suspected  gastric varices, and portal hypertensive gastropathy  ?2/27 Upper EGD with large varices unable to identify bleeding site  ?2/27 TIPS with embolization of splenic and portal tirbutaries to varices  ?2/28 Upper EGD with Red blood and significant clot burden was found in the stomach, visualization was quite poor. Pylorus reached. No clear source of active bleeding coming from the stomach ? ?Antibiotics:  ?2/19 Ceftriaxone for SBP prophylaxis  ? ?Interim History / Subjective:  ? ?Off pressors ?Sedation decr to 1 versed 50 fent  ? ?Net positive 7.6L  ? ?Objective   ?Blood pressure (!) 101/55, pulse 96, temperature 97.8 ?F (36.6 ?C), temperature source Axillary, resp. rate (!) 24, height 5\' 9"  (1.753 m), weight 97.8 kg, SpO2 98 %. ?   ?Vent Mode: PRVC ?FiO2 (%):  [40 %-45 %] 40 % ?Set Rate:  [20 bmp] 20 bmp ?Vt Set:  [560 mL] 560 mL ?PEEP:  [5 cmH20] 5 cmH20 ?Pressure Support:  [15 cmH20] 15 cmH20 ?Plateau Pressure:  [18 cmH20-22 cmH20] 19 cmH20  ? ?Intake/Output Summary (Last 24 hours) at 02/24/2022 0933 ?Last data filed at 02/24/2022 0730 ?Gross per 24 hour  ?Intake 2161.02 ml  ?Output 5205 ml  ?Net -3043.98 ml  ? ?Filed Weights  ? 03-21-22 0441 02/23/22 0336 02/24/22 0500  ?Weight: 103.7 kg 103.7 kg 97.8 kg  ? ? ?Examination: ?General: Critically ill adult M intubated sedated NAD  ?HEENT: NCAT pink mm dried blood oropharynx ETT secure  ?Neuro: Opens eyes to verbal stimulation. Grimaces to noxious stimulation. Does  not follow commands  ?CV: rrr s1s2 no rgm cap refill < 3 sec  ?PULM:  Symmetrical chest expansion. Mechanically ventilated. CTAb  ?GI:  + bowel sounds. Soft, distended.  ?Extremities: 3+ pitting edema BUE BLE  ?Skin: pale c/d/w  ? ?Resolved Hospital Problem list   ?Hypocalcemia  ?Severe metabolic acidosis ?Hemorrhagic shock  ?Hyperkalemia  ? ?Assessment & Plan:  ? ?Acute metabolic encephalopathy, multifactorial: ?-hemorrhagic shock, decompensated cirrhosis with hyperammonemia, metabolic acidosis, sedating  medications ?-as of 3/2, shock resolved acidosis improved. Ammonia 78 -- doubt this is significantly contributing to encephalopathy. Likely mostly slow metabolism of sedating meds  ?P ?-wean sedation as able ?-can cont lactulose and trend ammonia as able ? ?Acute respiratory insufficiency due to massive hematemesis due to UGIB requiring intubation  ?-ETT 2/27.  ?-is massively volume overloaded which may confound current resp status, but is on pretty minimal vent settings  ?P: ?-cont MV support ?-WUA/SBT as tolerated -- 3/2 is not awake enough to tolerate SBT  ?-VAP, pulm hygiene ?-PAD ( fent, versed) for goal 0 to -1  ?-diuresing as below ? ?Acute on subacute GIB - improving  ?Esophageal varices s/p banding x 11 ?EtOH abuse ?Decompensated alcoholic cirrhosis ?Coagulopathy, mild ?-MELD score 24.  ?-s/p MTP, TIPS / embo, rebled requiring second MTP (has rcvd >60 PRBC) ?-off pressors 3/2  ?-s/p phenobarb taper  ?P: ?-GI following ?-Trend H/H and transfuse PRN ?-diurese again 3/2 (will try Lasix 80mg  q12  -- tolerated 60mg  q4 3/1)  ?-cont B vit support  ?-close CBG monitoring -- may req D10  ? ?Volume overload due to MTP  ?P ?-diuresis as above  ? ?Thrombocytopenia  ?ABLA ?Iron deficiency  ?P: ?Trend CBC  ?Transfuse as needed  ? ?Seizure ?-Likely due to profound hypoperfusion and hypoxia ?-Head CT on 2/19 showed no acute intracranial pathology.  ?P: ?Management per neurology  ?Maintain neuro protective measures ?Nutrition and bowel regiment  ? ?Hypomagnesemia  ?Hypokalemia ?P ?-replace  ? ? ? ?Best Practice (right click and "Reselect all SmartList Selections" daily)  ? ?Diet/type: NPO ?DVT prophylaxis: prophylactic heparin  ?GI prophylaxis: PPI ?Lines: N/A   ?Foley:  N/A ?Code Status:  DNR ?Last date of multidisciplinary goals of care discussion [ with step sister on 2/19] ? ?CRITICAL CARE ?Performed by: 3/19 ? ? ?Total critical care time: 43 minutes ? ?Critical care time was exclusive of separately billable  procedures and treating other patients. ?Critical care was necessary to treat or prevent imminent or life-threatening deterioration. ? ?Critical care was time spent personally by me on the following activities: development of treatment plan with patient and/or surrogate as well as nursing, discussions with consultants, evaluation of patient's response to treatment, examination of patient, obtaining history from patient or surrogate, ordering and performing treatments and interventions, ordering and review of laboratory studies, ordering and review of radiographic studies, pulse oximetry and re-evaluation of patient's condition. ? ?3/19 MSN, AGACNP-BC ?[facility] ?Amion for pager  ?02/24/2022, 9:33 AM ? ? ? ?

## 2022-02-25 DIAGNOSIS — R0689 Other abnormalities of breathing: Secondary | ICD-10-CM

## 2022-02-25 DIAGNOSIS — G9341 Metabolic encephalopathy: Secondary | ICD-10-CM

## 2022-02-25 DIAGNOSIS — K2211 Ulcer of esophagus with bleeding: Secondary | ICD-10-CM

## 2022-02-25 DIAGNOSIS — E8771 Transfusion associated circulatory overload: Secondary | ICD-10-CM

## 2022-02-25 LAB — MAGNESIUM: Magnesium: 1.8 mg/dL (ref 1.7–2.4)

## 2022-02-25 LAB — BPAM RBC
Blood Product Expiration Date: 202303242359
Blood Product Expiration Date: 202303242359
Blood Product Expiration Date: 202303262359
Blood Product Expiration Date: 202303292359
Blood Product Expiration Date: 202303292359
Blood Product Expiration Date: 202303302359
Blood Product Expiration Date: 202303302359
Blood Product Expiration Date: 202303302359
Blood Product Expiration Date: 202303302359
Blood Product Expiration Date: 202303302359
Blood Product Expiration Date: 202303302359
Blood Product Expiration Date: 202303302359
Blood Product Expiration Date: 202303312359
Blood Product Expiration Date: 202303312359
Blood Product Expiration Date: 202304022359
Blood Product Expiration Date: 202304022359
Blood Product Expiration Date: 202304032359
Blood Product Expiration Date: 202304032359
Blood Product Expiration Date: 202304032359
Blood Product Expiration Date: 202304032359
Blood Product Expiration Date: 202304032359
Blood Product Expiration Date: 202304032359
Blood Product Expiration Date: 202304032359
Blood Product Expiration Date: 202304032359
Blood Product Expiration Date: 202304032359
Blood Product Expiration Date: 202304042359
Blood Product Expiration Date: 202304042359
Blood Product Expiration Date: 202304042359
Blood Product Expiration Date: 202304042359
Blood Product Expiration Date: 202304042359
Blood Product Expiration Date: 202304042359
Blood Product Expiration Date: 202304042359
ISSUE DATE / TIME: 202302270726
ISSUE DATE / TIME: 202302270726
ISSUE DATE / TIME: 202302270726
ISSUE DATE / TIME: 202302271058
ISSUE DATE / TIME: 202302271058
ISSUE DATE / TIME: 202302271340
ISSUE DATE / TIME: 202302271340
ISSUE DATE / TIME: 202302271340
ISSUE DATE / TIME: 202302271340
ISSUE DATE / TIME: 202302271617
ISSUE DATE / TIME: 202302271617
ISSUE DATE / TIME: 202302271617
ISSUE DATE / TIME: 202302271617
ISSUE DATE / TIME: 202302271630
ISSUE DATE / TIME: 202302271630
ISSUE DATE / TIME: 202302271630
ISSUE DATE / TIME: 202302271630
ISSUE DATE / TIME: 202302271731
ISSUE DATE / TIME: 202302271731
ISSUE DATE / TIME: 202302271731
ISSUE DATE / TIME: 202302280013
ISSUE DATE / TIME: 202302280013
ISSUE DATE / TIME: 202302280146
ISSUE DATE / TIME: 202302280146
ISSUE DATE / TIME: 202302280315
ISSUE DATE / TIME: 202302280315
ISSUE DATE / TIME: 202302280315
ISSUE DATE / TIME: 202302280315
Unit Type and Rh: 5100
Unit Type and Rh: 5100
Unit Type and Rh: 5100
Unit Type and Rh: 5100
Unit Type and Rh: 5100
Unit Type and Rh: 5100
Unit Type and Rh: 5100
Unit Type and Rh: 5100
Unit Type and Rh: 5100
Unit Type and Rh: 5100
Unit Type and Rh: 5100
Unit Type and Rh: 5100
Unit Type and Rh: 5100
Unit Type and Rh: 5100
Unit Type and Rh: 5100
Unit Type and Rh: 5100
Unit Type and Rh: 5100
Unit Type and Rh: 5100
Unit Type and Rh: 5100
Unit Type and Rh: 5100
Unit Type and Rh: 5100
Unit Type and Rh: 5100
Unit Type and Rh: 5100
Unit Type and Rh: 5100
Unit Type and Rh: 5100
Unit Type and Rh: 5100
Unit Type and Rh: 5100
Unit Type and Rh: 5100
Unit Type and Rh: 5100
Unit Type and Rh: 5100
Unit Type and Rh: 5100
Unit Type and Rh: 5100

## 2022-02-25 LAB — TYPE AND SCREEN
ABO/RH(D): O POS
Antibody Screen: NEGATIVE
Unit division: 0
Unit division: 0
Unit division: 0
Unit division: 0
Unit division: 0
Unit division: 0
Unit division: 0
Unit division: 0
Unit division: 0
Unit division: 0
Unit division: 0
Unit division: 0
Unit division: 0
Unit division: 0
Unit division: 0
Unit division: 0
Unit division: 0
Unit division: 0
Unit division: 0
Unit division: 0
Unit division: 0
Unit division: 0
Unit division: 0
Unit division: 0
Unit division: 0
Unit division: 0
Unit division: 0
Unit division: 0
Unit division: 0
Unit division: 0
Unit division: 0
Unit division: 0

## 2022-02-25 LAB — CBC
HCT: 31.9 % — ABNORMAL LOW (ref 39.0–52.0)
Hemoglobin: 10.5 g/dL — ABNORMAL LOW (ref 13.0–17.0)
MCH: 30.8 pg (ref 26.0–34.0)
MCHC: 32.9 g/dL (ref 30.0–36.0)
MCV: 93.5 fL (ref 80.0–100.0)
Platelets: 68 10*3/uL — ABNORMAL LOW (ref 150–400)
RBC: 3.41 MIL/uL — ABNORMAL LOW (ref 4.22–5.81)
RDW: 16.6 % — ABNORMAL HIGH (ref 11.5–15.5)
WBC: 13.2 10*3/uL — ABNORMAL HIGH (ref 4.0–10.5)
nRBC: 0 % (ref 0.0–0.2)

## 2022-02-25 LAB — PHOSPHORUS: Phosphorus: 3.8 mg/dL (ref 2.5–4.6)

## 2022-02-25 LAB — BASIC METABOLIC PANEL
Anion gap: 4 — ABNORMAL LOW (ref 5–15)
BUN: 27 mg/dL — ABNORMAL HIGH (ref 6–20)
CO2: 26 mmol/L (ref 22–32)
Calcium: 7.6 mg/dL — ABNORMAL LOW (ref 8.9–10.3)
Chloride: 109 mmol/L (ref 98–111)
Creatinine, Ser: 0.96 mg/dL (ref 0.61–1.24)
GFR, Estimated: >60 mL/min (ref >60)
Glucose, Bld: 97 mg/dL (ref 70–99)
Potassium: 4.9 mmol/L (ref 3.5–5.1)
Sodium: 139 mmol/L (ref 135–145)

## 2022-02-25 LAB — GLUCOSE, CAPILLARY
Glucose-Capillary: 101 mg/dL — ABNORMAL HIGH (ref 70–99)
Glucose-Capillary: 107 mg/dL — ABNORMAL HIGH (ref 70–99)
Glucose-Capillary: 109 mg/dL — ABNORMAL HIGH (ref 70–99)
Glucose-Capillary: 112 mg/dL — ABNORMAL HIGH (ref 70–99)
Glucose-Capillary: 118 mg/dL — ABNORMAL HIGH (ref 70–99)
Glucose-Capillary: 94 mg/dL (ref 70–99)

## 2022-02-25 MED ORDER — CALCIUM GLUCONATE-NACL 1-0.675 GM/50ML-% IV SOLN
1.0000 g | Freq: Once | INTRAVENOUS | Status: AC
  Administered 2022-02-25: 1000 mg via INTRAVENOUS
  Filled 2022-02-25: qty 50

## 2022-02-25 MED ORDER — FUROSEMIDE 10 MG/ML IJ SOLN
80.0000 mg | Freq: Two times a day (BID) | INTRAMUSCULAR | Status: AC
  Administered 2022-02-25 (×2): 80 mg via INTRAVENOUS
  Filled 2022-02-25 (×2): qty 8

## 2022-02-25 NOTE — Progress Notes (Signed)
? ?NAME:  Harold Howard, MRN:  [id number], DOB:  [birth year], LOS: 12 ?ADMISSION DATE:  February 28, 2022, CONSULTATION DATE:  Feb 28, 2022 ?REFERRING MD:  Horton-EDP, CHIEF COMPLAINT:  hemorrhagic shock  ? ?History of Present Illness:  ?Mr. Harold Howard is a 41 y/o gentleman with a history of daily ETOH use who presents with 1 month of vomiting blood and dark stools consistent with upper GI bleed s/p EGD on 2/19 with banding of esophageal varices.  ? ?History is provided by the ED and his step sister as he is intubated and sedated. His family is estranged due to his ETOH consumption. His step-sister indicated she is the only family he is in contact with. He drinks about a fifth per day, but has been working on cutting back recently, but he still drinks about a half gallon of liquor every 2-3 days. He came home from work early yesterday after texting his roommate a picture of a toilet bowl full of BRB. He was found on the floor this morning screaming to call 911. No emesis with EMS. Received 500cc crystalloid and zofran. He was hypotensive and developed worsening encephalopathy in the ED and required intubation. He had a seizure in the ED.  He received 8 pRBCs. PCCM consulted for admission. GI consulted by the ED. ? ?No known use of NSAIDs or anticoagulants. His step-sister is not clear on his medical history. ? ?Pertinent  Medical History  ?ETOH abuse ? ?Significant Hospital Events: ?Including procedures, antibiotic start and stop dates in addition to other pertinent events   ?2/19 admission, intubated sedated, EGD  ?2/20 paracentesis  ?2/22 patient self-extubated, off pressors  ?2/27 Recurrent upper GI bleed on the floor, stabilized and transferred back to the ICU. GI notified  ?2/28 Underwent MTP twice afternoon of 2/27 he had TIPS with embolectomy but unfortunately re-bleed post TIPS  ?3/1 no further severe bleeding episodes since early am of 2/28 ?3/2 OGT placed started on ED ?3/3 SBT 12/5 but still quite sedated (RASS -3, goal  0)  ? ?Procedures:  ?2/19 EGD: EGD yesterday with large esophageal varices, suspected gastric varices, and portal hypertensive gastropathy  ?2/27 Upper EGD with large varices unable to identify bleeding site  ?2/27 TIPS with embolization of splenic and portal tirbutaries to varices  ?2/28 Upper EGD with Red blood and significant clot burden was found in the stomach, visualization was quite poor. Pylorus reached. No clear source of active bleeding coming from the stomach ? ?Antibiotics:  ?2/19 Ceftriaxone for SBP prophylaxis  ?Rifaximin 3/2>  ? ?Interim History / Subjective:  ? ?Remains off pressors ? ?8fent 1 versed overnight -- this morning with a RASS -3 ?I shut off versed gtt  ? ?PSV 12/5 with Vt 600-800  ? ?Ascites on POCUS  ? ? ?Objective   ?Blood pressure 113/63, pulse 94, temperature 98.4 ?F (36.9 ?C), temperature source Axillary, resp. rate 20, height 5\' 9"  (1.753 m), weight 97.8 kg, SpO2 99 %. ?   ?Vent Mode: PSV;CPAP ?FiO2 (%):  [40 %] 40 % ?Set Rate:  [20 bmp] 20 bmp ?Vt Set:  [560 mL] 560 mL ?PEEP:  [5 cmH20] 5 cmH20 ?Pressure Support:  [12 cmH20] 12 cmH20 ?Plateau Pressure:  [15 cmH20-19 cmH20] 15 cmH20  ? ?Intake/Output Summary (Last 24 hours) at 02/25/2022 0856 ?Last data filed at 02/25/2022 0800 ?Gross per 24 hour  ?Intake 898.2 ml  ?Output 3965 ml  ?Net -3066.8 ml  ? ?Filed Weights  ? 02/02/2022 0441 02/23/22 0336 02/24/22 0500  ?Weight: 103.7 kg 103.7  kg 97.8 kg  ? ? ?Examination: ?General: Critically ill appearing middle aged M intubated sedated NAD  ?HEENT: NCAT ETT secure OGT secure. Dried blood oropharynx.  ?Neuro: Sedated, RASS -3.  ?CV: rrr s1s2 cap refill < 3 sec  ?PULM:  Diminished bibasilar sounds. Even unlabored on PSV 12/5 with Vt 700  ?GI:  Distended. Hypoactive. BMS  ?GU: edematous scrotum. Foley  ?Extremities: BLE 3+ edema 2+ edema BUE. No acute joint deformity  ?Skin: pale c/d/w no anterior rash  ? ?Resolved Hospital Problem list   ?Hypocalcemia  ?Severe metabolic acidosis ?Hemorrhagic  shock  ?Hyperkalemia  ? ?Assessment & Plan:  ? ?Acute metabolic encephalopathy, multifactorial  ?-hemorrhagic shock, decompensated cirrhosis with hyperammonemia, metabolic acidosis, sedating medications ?-as of 3/2, shock resolved acidosis improved. Ammonia 78 -- doubt this is significantly contributing to encephalopathy.  ?-At this point, sedating meds are likely the biggest contributor. Has been maintained RASS -3 despite goal 0 to -1, will likely be slow to metabolize with hepatic fxn  ?P ?-RASS goal remains  0 to -1, dc versed gtt  ?-cont lactulose  ?-delirium precautions  ? ?Acute respiratory insufficiency due to massive hematemesis due to UGIB, requiring intubation  ?-ETT 2/27.  ?-is massively volume overloaded which may confound current resp status ?-3/3 PSV 12/5 -- on 10/5 and 8/5, had very small Vt. POCUS with ascites which is likely compressing lungs  ?P: ?-cont MV support ?-WUA/SBT as tolerated  -- 3/3 is on PSV (12/5) but is still over-sedated  ?-VAP, pulm hygiene ?-diuresing as below, hoping this will help with ascites which is restricting Vt. May require repeat para  ? ?Acute on subacute GIB - improved ?Esophageal varices s/p banding x11 ?EtOH abuse ?Decompensated alcoholic cirrhosis with ascites  ?Coagulopathy, mild  ?ABLA- stable ?Thrombocytopenia  ?-MELD score 24.  ?-s/p MTP, TIPS / embo on 2/27, rebled requiring second MTP (has rcvd >60 PRBC) ?-para 2/27 -- not c/w SBP ?-off pressors 3/2  ?-s/p phenobarb taper  ?-ascites on POCUS 3/3  ?P: ?-GI following ?-Trend CBC, transfuse as needed  ?-cont B vit support  ?-rifaximin  ?-rocephin  ?-may require repeat para, hoping diuresis will help  ? ?Volume overload due to MTP in setting of hemorrhagic shock  ?P ?-will cont diuresis -- 3/3 giving Lasix 80mg  q12  ? ?Seizure, provoked  ?-Likely due to profound hypoperfusion and hypoxia  ?-seen by neuro  ? ?Hypomagnesemia  ?Hypokalemia ?P ?-cont to trend and replace as needed ? ? ?Best Practice (right click and  "Reselect all SmartList Selections" daily)  ? ?Diet/type: tubefeeds and NPO ?DVT prophylaxis: prophylactic heparin  ?GI prophylaxis: PPI ?Lines: Central line -- if able to get second PIV 3/3 will dc    ?Foley:  Yes, and it is still needed ?Code Status:  DNR ?Last date of multidisciplinary goals of care discussion [ with sig other 3/2] ? ?CRITICAL CARE ?Performed by: Tresa Endo ? ?Total critical care time: 47 minutes ? ?Critical care time was exclusive of separately billable procedures and treating other patients. ?Critical care was necessary to treat or prevent imminent or life-threatening deterioration. ? ?Critical care was time spent personally by me on the following activities: development of treatment plan with patient and/or surrogate as well as nursing, discussions with consultants, evaluation of patient's response to treatment, examination of patient, obtaining history from patient or surrogate, ordering and performing treatments and interventions, ordering and review of laboratory studies, ordering and review of radiographic studies, pulse oximetry and re-evaluation of patient's condition. ? ?  Tessie Fass MSN, AGACNP-BC ?[facility] ?Amion for pager  ?02/25/2022, 8:56 AM ? ? ? ? ?

## 2022-02-25 NOTE — H&P (View-Only) (Signed)
? ? ? ?Progress Note ?Hospital Day: 13 ? ?Chief Complaint:  variceal bleed ?   ? ?ASSESSMENT AND PLAN  ? ?Brief History: 41 yo male with Etoh cirrhosis with portal HTN / Etoh hepatitis admitted a couple of weeks ago with hemorrhagic shock / variceal bleed. Hgb of 1.9.   ? ?# Decompensated cirrhosis, probably Etoh related. Negative work-up for metabolic, autoimmune and genetic causes of liver disease. Admitted with variceal 02/12/22 . EGD >>banding x 11. Reflux esophagitis and portal hypertensive gastropathy. Suspected gastric varices. He re- bled on 2/27, received pressors and multiple blood products including 8u PRBC. Repeat EGD 2/27 showed large varices , blood in esophagus but source could be localized. Underwent TIPS. Repeat EGD 2/28 showed red blood / clot in the esophagus. Visualization poor without focal pathology that was amenable to endoscopic therapy.  The varices have been decompressed post TIPS. Of the post banding ulcers that were visualized there was no active extravasation. Diagnostic para on 2/20 negative for SBP. Got 5 days of Rocephin for SBP prophylaxis ( ends today) ? ?Interval History:  ?- He fortunately has not bled again. Shock resolved. Trying to extubate. Off sedation since 930 am.  Can increase lactulose if doesn't wake up soon.  ?-Has anasarca. Started lasix 80 mg IV BID this am. Has already has 1500 cc urine output this am.  ?--Continue Xifaxan and BID Lactulose. So far 300 ml stool output since 7 am.  ?-Tolerating tube feeds. At 40 ml / hr  ? ? ? [facility] Attending  ? ?Still not bleeding! ?Thanks to IR and PCCM for excellent care. ?D/W Dr. Desai - we will sign off for now and ask for a callback when GI input needed.  ? ?Karli Wickizer E. Jadia Capers, MD, FACG ?[facility] ?02/25/2022 4:06 PM ? ? ?OBJECTIVE  ?  ? ?Scheduled inpatient medications:  ? sodium chloride   Intravenous Once  ? sodium chloride   Intravenous Once  ? chlorhexidine gluconate (MEDLINE KIT)  15 mL Mouth Rinse BID  ?  Chlorhexidine Gluconate Cloth  6 each Topical Daily  ? docusate  100 mg Per Tube BID  ? feeding supplement (PROSource TF)  45 mL Per Tube BID  ? feeding supplement (VITAL 1.5 CAL)  1,000 mL Per Tube Q24H  ? fentaNYL (SUBLIMAZE) injection  50 mcg Intravenous Once  ? folic acid  1 mg Per Tube Daily  ? furosemide  80 mg Intravenous Q12H  ? iron polysaccharides  150 mg Per Tube Daily  ? lactulose  20 g Per Tube BID  ? mouth rinse  15 mL Mouth Rinse 10 times per day  ? multivitamin with minerals  1 tablet Per Tube Daily  ? nicotine  21 mg Transdermal Daily  ? pantoprazole  40 mg Intravenous Q12H  ? polyethylene glycol  17 g Per Tube Daily  ? rifaximin  550 mg Per Tube BID  ? sodium chloride flush  10-40 mL Intracatheter Q12H  ? thiamine  100 mg Per Tube Daily  ? ?Continuous inpatient infusions:  ? sodium chloride Stopped (02/15/2022 0024)  ? sodium chloride Stopped (02/20/2022 0957)  ? fentaNYL infusion INTRAVENOUS 50 mcg/hr (02/25/22 0800)  ? ?PRN inpatient medications: sodium chloride, albuterol, fentaNYL, lip balm, ondansetron (ZOFRAN) IV, sodium chloride flush ? ?Vital signs in last 24 hours: ?Temp:  [97.7 ?F (36.5 ?C)-98.8 ?F (37.1 ?C)] 98.1 ?F (36.7 ?C) (03/03 1130) ?Pulse Rate:  [76-103] 103 (03/03 1015) ?Resp:  [11-23] 17 (03/03 1015) ?BP: (100-119)/(52-67) 116/65 (03/03 1000) ?  SpO2:  [97 %-100 %] 100 % (03/03 1015) ?FiO2 (%):  [40 %] 40 % (03/03 0731) ?Last BM Date : 02/25/22 ? ?Intake/Output Summary (Last 24 hours) at 02/25/2022 1149 ?Last data filed at 02/25/2022 0800 ?Gross per 24 hour  ?Intake 674.87 ml  ?Output 3850 ml  ?Net -3175.13 ml  ? ? ?Physical Exam:  ?General:  NAD. Children visited ?Heart:  Regular rate and rhythm. Anasarca ?Pulmonary: Normal respiratory effort. Still intubated but on cpap.   ?Abdomen: Soft, distended. Normal bowel sounds.  ?Neurologic: somnolent ? ? ?Filed Weights  ? 02/07/2022 0441 02/23/22 0336 02/24/22 0500  ?Weight: 103.7 kg 103.7 kg 97.8 kg  ? ? ?Intake/Output from previous day: ?03/02  0701 - 03/03 0700 ?In: 1199.6 [I.V.:157.6; NG/GT:390.3; IV Piggyback:351.7] ?Out: 3965 [BWIOM:3559] ?Intake/Output this shift: ?Total I/O ?In: 14.4 [I.V.:14.4] ?Out: -  ? ? ?DIAGNOSTIC STUDIES THIS ADMISSION:  ?DG Abd Portable 1V ? ?Result Date: 02/24/2022 ?CLINICAL DATA:  Enteric tube placement.  Recent TIPS. EXAM: PORTABLE ABDOMEN - 1 VIEW COMPARISON:  None. FINDINGS: Enteric tube in the stomach. The bowel gas pattern is normal. No radio-opaque calculi or other significant radiographic abnormality are seen. TIPS stent noted with multiple embolization coils in the mid upper abdomen. IMPRESSION: 1. Enteric tube in the stomach. Electronically Signed   By: Titus Dubin M.D.   On: 02/24/2022 11:56   ? ? ?Lab Results: ?Recent Labs  ?  02/23/22 ?0636 02/24/22 ?1051 02/25/22 ?0655  ?WBC 13.7*  --  13.2*  ?HGB 10.2* 10.9* 10.5*  ?HCT 28.2* 31.9* 31.9*  ?PLT 61*  --  68*  ? ?BMET ?Recent Labs  ?  02/23/22 ?7416 02/24/22 ?0209 02/25/22 ?3845  ?NA 138 138 139  ?K 3.6 3.8 4.9  ?CL 108 106 109  ?CO2 _0 ?GLUCOSE 113* 107* 97  ?BUN 24* 28* 27*  ?CREATININE 0.94 1.10 0.96  ?CALCIUM 7.8* 7.9* 7.6*  ? ?LFT ?Recent Labs  ?  02/24/22 ?0209  ?PROT 3.9*  ?ALBUMIN 1.9*  ?AST 74*  ?ALT 30  ?ALKPHOS 67  ?BILITOT 5.5*  ?BILIDIR 1.7*  ?IBILI 3.8*  ? ?PT/INR ?Recent Labs  ?  02/23/22 ?1312  ?LABPROT 17.3*  ?INR 1.4*  ? ?Hepatitis Panel ?No results for input(s): HEPBSAG, HCVAB, HEPAIGM, HEPBIGM in the last 72 hours. ? ? ? ?Principal Problem: ?  Upper GI bleed ?Active Problems: ?  ETOH abuse ?  Hypovolemic shock ([place]) ?  Bleeding esophageal varices ([facility]) ?  Malnutrition of moderate degree ?  Alcoholic cirrhosis of liver with ascites ([place]) ? ? ? ? LOS: 12 days  ? ?Tye Savoy ,NP 02/25/2022, 11:49 AM ? ? ? ? ? ? ?

## 2022-02-25 NOTE — Progress Notes (Addendum)
? ? ? ?Progress Note ?Hospital Day: 13 ? ?Chief Complaint:  variceal bleed ?   ? ?ASSESSMENT AND PLAN  ? ?Brief History: 41 yo male with Etoh cirrhosis with portal HTN / Etoh hepatitis admitted a couple of weeks ago with hemorrhagic shock / variceal bleed. Hgb of 1.9.   ? ?# Decompensated cirrhosis, probably Etoh related. Negative work-up for metabolic, autoimmune and genetic causes of liver disease. Admitted with variceal 02/12/22 . EGD >>banding x 11. Reflux esophagitis and portal hypertensive gastropathy. Suspected gastric varices. He re- bled on 2/27, received pressors and multiple blood products including 8u PRBC. Repeat EGD 2/27 showed large varices , blood in esophagus but source could be localized. Underwent TIPS. Repeat EGD 2/28 showed red blood / clot in the esophagus. Visualization poor without focal pathology that was amenable to endoscopic therapy.  The varices have been decompressed post TIPS. Of the post banding ulcers that were visualized there was no active extravasation. Diagnostic para on 2/20 negative for SBP. Got 5 days of Rocephin for SBP prophylaxis ( ends today) ? ?Interval History:  ?- He fortunately has not bled again. Shock resolved. Trying to extubate. Off sedation since 930 am.  Can increase lactulose if doesn't wake up soon.  ?-Has anasarca. Started lasix 80 mg IV BID this am. Has already has 1500 cc urine output this am.  ?--Continue Xifaxan and BID Lactulose. So far 300 ml stool output since 7 am.  ?-Tolerating tube feeds. At 40 ml / hr  ? ? ? Harold Howard  ? ?Still not bleeding! ?Thanks to IR and PCCM for excellent care. ?D/W Dr. Shearon Stalls - we will sign off for now and ask for a callback when GI input needed.  ? ?Harold Mayer, Harold Howard, Harold Howard ?Harold Howard ?02/25/2022 4:06 PM ? ? ?OBJECTIVE  ?  ? ?Scheduled inpatient medications:  ? sodium chloride   Intravenous Once  ? sodium chloride   Intravenous Once  ? chlorhexidine gluconate (MEDLINE KIT)  15 mL Mouth Rinse BID  ?  Chlorhexidine Gluconate Cloth  6 each Topical Daily  ? docusate  100 mg Per Tube BID  ? feeding supplement (PROSource TF)  45 mL Per Tube BID  ? feeding supplement (VITAL 1.5 CAL)  1,000 mL Per Tube Q24H  ? fentaNYL (SUBLIMAZE) injection  50 mcg Intravenous Once  ? folic acid  1 mg Per Tube Daily  ? furosemide  80 mg Intravenous Q12H  ? iron polysaccharides  150 mg Per Tube Daily  ? lactulose  20 g Per Tube BID  ? mouth rinse  15 mL Mouth Rinse 10 times per day  ? multivitamin with minerals  1 tablet Per Tube Daily  ? nicotine  21 mg Transdermal Daily  ? pantoprazole  40 mg Intravenous Q12H  ? polyethylene glycol  17 g Per Tube Daily  ? rifaximin  550 mg Per Tube BID  ? sodium chloride flush  10-40 mL Intracatheter Q12H  ? thiamine  100 mg Per Tube Daily  ? ?Continuous inpatient infusions:  ? sodium chloride Stopped (02/07/2022 0024)  ? sodium chloride Stopped (02/18/2022 0957)  ? fentaNYL infusion INTRAVENOUS 50 mcg/hr (02/25/22 0800)  ? ?PRN inpatient medications: sodium chloride, albuterol, fentaNYL, lip balm, ondansetron (ZOFRAN) IV, sodium chloride flush ? ?Vital signs in last 24 hours: ?Temp:  [97.7 ?F (36.5 ?C)-98.8 ?F (37.1 ?C)] 98.1 ?F (36.7 ?C) (03/03 1130) ?Pulse Rate:  [76-103] 103 (03/03 1015) ?Resp:  [11-23] 17 (03/03 1015) ?BP: (100-119)/(52-67) 116/65 (03/03 1000) ?  SpO2:  [97 %-100 %] 100 % (03/03 1015) ?FiO2 (%):  [40 %] 40 % (03/03 0731) ?Last BM Date : 02/25/22 ? ?Intake/Output Summary (Last 24 hours) at 02/25/2022 1149 ?Last data filed at 02/25/2022 0800 ?Gross per 24 hour  ?Intake 674.87 ml  ?Output 3850 ml  ?Net -3175.13 ml  ? ? ?Physical Exam:  ?General:  NAD. Children visited ?Heart:  Regular rate and rhythm. Anasarca ?Pulmonary: Normal respiratory effort. Still intubated but on cpap.   ?Abdomen: Soft, distended. Normal bowel sounds.  ?Neurologic: somnolent ? ? ?Filed Weights  ? 02/07/2022 0441 02/23/22 0336 02/24/22 0500  ?Weight: 103.7 kg 103.7 kg 97.8 kg  ? ? ?Intake/Output from previous day: ?03/02  0701 - 03/03 0700 ?In: 1199.6 [I.V.:157.6; NG/GT:390.3; IV Piggyback:351.7] ?Out: 3965 [BWIOM:3559] ?Intake/Output this shift: ?Total I/O ?In: 14.4 [I.V.:14.4] ?Out: -  ? ? ?DIAGNOSTIC STUDIES THIS ADMISSION:  ?DG Abd Portable 1V ? ?Result Date: 02/24/2022 ?CLINICAL DATA:  Enteric tube placement.  Recent TIPS. EXAM: PORTABLE ABDOMEN - 1 VIEW COMPARISON:  None. FINDINGS: Enteric tube in the stomach. The bowel gas pattern is normal. No radio-opaque calculi or other significant radiographic abnormality are seen. TIPS stent noted with multiple embolization coils in the mid upper abdomen. IMPRESSION: 1. Enteric tube in the stomach. Electronically Signed   By: Titus Dubin M.D.   On: 02/24/2022 11:56   ? ? ?Lab Results: ?Recent Labs  ?  02/23/22 ?0636 02/24/22 ?1051 02/25/22 ?0655  ?WBC 13.7*  --  13.2*  ?HGB 10.2* 10.9* 10.5*  ?HCT 28.2* 31.9* 31.9*  ?PLT 61*  --  68*  ? ?BMET ?Recent Labs  ?  02/23/22 ?7416 02/24/22 ?0209 02/25/22 ?3845  ?NA 138 138 139  ?K 3.6 3.8 4.9  ?CL 108 106 109  ?CO2 _0 ?GLUCOSE 113* 107* 97  ?BUN 24* 28* 27*  ?CREATININE 0.94 1.10 0.96  ?CALCIUM 7.8* 7.9* 7.6*  ? ?LFT ?Recent Labs  ?  02/24/22 ?0209  ?PROT 3.9*  ?ALBUMIN 1.9*  ?AST 74*  ?ALT 30  ?ALKPHOS 67  ?BILITOT 5.5*  ?BILIDIR 1.7*  ?IBILI 3.8*  ? ?PT/INR ?Recent Labs  ?  02/23/22 ?1312  ?LABPROT 17.3*  ?INR 1.4*  ? ?Hepatitis Panel ?No results for input(s): HEPBSAG, HCVAB, HEPAIGM, HEPBIGM in the last 72 hours. ? ? ? ?Principal Problem: ?  Upper GI bleed ?Active Problems: ?  ETOH abuse ?  Hypovolemic shock ([place]) ?  Bleeding esophageal varices ([facility]) ?  Malnutrition of moderate degree ?  Alcoholic cirrhosis of liver with ascites ([place]) ? ? ? ? LOS: 12 days  ? ?Harold Howard ,Harold Howard 02/25/2022, 11:49 AM ? ? ? ? ? ? ?

## 2022-02-26 ENCOUNTER — Encounter (HOSPITAL_COMMUNITY): Admission: EM | Disposition: E | Payer: Self-pay | Source: Home / Self Care | Attending: Internal Medicine

## 2022-02-26 ENCOUNTER — Encounter (HOSPITAL_COMMUNITY): Payer: Self-pay | Admitting: Critical Care Medicine

## 2022-02-26 ENCOUNTER — Inpatient Hospital Stay (HOSPITAL_COMMUNITY): Payer: Self-pay

## 2022-02-26 HISTORY — PX: ESOPHAGOGASTRODUODENOSCOPY: SHX5428

## 2022-02-26 LAB — MAGNESIUM: Magnesium: 2 mg/dL (ref 1.7–2.4)

## 2022-02-26 LAB — CBC
HCT: 32.1 % — ABNORMAL LOW (ref 39.0–52.0)
HCT: 30.7 % — ABNORMAL LOW (ref 39.0–52.0)
Hemoglobin: 10.7 g/dL — ABNORMAL LOW (ref 13.0–17.0)
Hemoglobin: 9.7 g/dL — ABNORMAL LOW (ref 13.0–17.0)
MCH: 31.3 pg (ref 26.0–34.0)
MCH: 30.9 pg (ref 26.0–34.0)
MCHC: 33.3 g/dL (ref 30.0–36.0)
MCHC: 31.6 g/dL (ref 30.0–36.0)
MCV: 93.9 fL (ref 80.0–100.0)
MCV: 97.8 fL (ref 80.0–100.0)
Platelets: 67 10*3/uL — ABNORMAL LOW (ref 150–400)
Platelets: 62 10*3/uL — ABNORMAL LOW (ref 150–400)
RBC: 3.42 MIL/uL — ABNORMAL LOW (ref 4.22–5.81)
RBC: 3.14 MIL/uL — ABNORMAL LOW (ref 4.22–5.81)
RDW: 17.3 % — ABNORMAL HIGH (ref 11.5–15.5)
RDW: 17.9 % — ABNORMAL HIGH (ref 11.5–15.5)
WBC: 14.8 10*3/uL — ABNORMAL HIGH (ref 4.0–10.5)
WBC: 16.6 10*3/uL — ABNORMAL HIGH (ref 4.0–10.5)
nRBC: 0 % (ref 0.0–0.2)
nRBC: 0 % (ref 0.0–0.2)

## 2022-02-26 LAB — TYPE AND SCREEN
ABO/RH(D): O POS
Antibody Screen: NEGATIVE

## 2022-02-26 LAB — GLUCOSE, CAPILLARY
Glucose-Capillary: 124 mg/dL — ABNORMAL HIGH (ref 70–99)
Glucose-Capillary: 130 mg/dL — ABNORMAL HIGH (ref 70–99)
Glucose-Capillary: 135 mg/dL — ABNORMAL HIGH (ref 70–99)
Glucose-Capillary: 135 mg/dL — ABNORMAL HIGH (ref 70–99)
Glucose-Capillary: 144 mg/dL — ABNORMAL HIGH (ref 70–99)

## 2022-02-26 LAB — BASIC METABOLIC PANEL
Anion gap: 9 (ref 5–15)
Anion gap: 7 (ref 5–15)
BUN: 26 mg/dL — ABNORMAL HIGH (ref 6–20)
BUN: 31 mg/dL — ABNORMAL HIGH (ref 6–20)
CO2: 28 mmol/L (ref 22–32)
CO2: 29 mmol/L (ref 22–32)
Calcium: 8.3 mg/dL — ABNORMAL LOW (ref 8.9–10.3)
Calcium: 8.3 mg/dL — ABNORMAL LOW (ref 8.9–10.3)
Chloride: 106 mmol/L (ref 98–111)
Chloride: 111 mmol/L (ref 98–111)
Creatinine, Ser: 0.86 mg/dL (ref 0.61–1.24)
Creatinine, Ser: 0.85 mg/dL (ref 0.61–1.24)
GFR, Estimated: >60 mL/min (ref >60)
GFR, Estimated: >60 mL/min (ref >60)
Glucose, Bld: 138 mg/dL — ABNORMAL HIGH (ref 70–99)
Glucose, Bld: 136 mg/dL — ABNORMAL HIGH (ref 70–99)
Potassium: 3.4 mmol/L — ABNORMAL LOW (ref 3.5–5.1)
Potassium: 3.8 mmol/L (ref 3.5–5.1)
Sodium: 143 mmol/L (ref 135–145)
Sodium: 147 mmol/L — ABNORMAL HIGH (ref 135–145)

## 2022-02-26 LAB — TROPONIN I (HIGH SENSITIVITY): Troponin I (High Sensitivity): 22 ng/L — ABNORMAL HIGH (ref <18)

## 2022-02-26 LAB — AMMONIA: Ammonia: 75 umol/L — ABNORMAL HIGH (ref 9–35)

## 2022-02-26 SURGERY — EGD (ESOPHAGOGASTRODUODENOSCOPY)
Anesthesia: Monitor Anesthesia Care

## 2022-02-26 MED ORDER — NOREPINEPHRINE 4 MG/250ML-% IV SOLN
INTRAVENOUS | Status: AC
  Filled 2022-02-26: qty 250

## 2022-02-26 MED ORDER — SODIUM CHLORIDE 0.9 % IV SOLN: INTRAVENOUS | Status: DC

## 2022-02-26 MED ORDER — FENTANYL BOLUS VIA INFUSION
100.0000 ug | INTRAVENOUS | Status: DC | PRN
  Filled 2022-02-26: qty 100

## 2022-02-26 MED ORDER — LORAZEPAM 2 MG/ML IJ SOLN
2.0000 mg | INTRAMUSCULAR | Status: DC | PRN
  Administered 2022-02-27 – 2022-02-28 (×2): 2 mg via INTRAVENOUS
  Filled 2022-02-26 (×2): qty 1

## 2022-02-26 MED ORDER — MIDAZOLAM HCL (PF) 10 MG/2ML IJ SOLN
INTRAMUSCULAR | Status: DC | PRN
  Administered 2022-02-26: 2 mg via INTRAVENOUS

## 2022-02-26 MED ORDER — MAGNESIUM SULFATE 2 GM/50ML IV SOLN
2.0000 g | Freq: Once | INTRAVENOUS | Status: AC
  Administered 2022-02-26: 2 g via INTRAVENOUS
  Filled 2022-02-26: qty 50

## 2022-02-26 MED ORDER — NOREPINEPHRINE 4 MG/250ML-% IV SOLN: 0.0000 ug/min | INTRAVENOUS | Status: DC

## 2022-02-26 MED ORDER — HALOPERIDOL LACTATE 2 MG/ML PO CONC
0.5000 mg | ORAL | Status: DC | PRN
  Filled 2022-02-26: qty 0.3

## 2022-02-26 MED ORDER — POTASSIUM CHLORIDE 20 MEQ PO PACK
40.0000 meq | PACK | Freq: Once | ORAL | Status: AC
  Administered 2022-02-26: 40 meq
  Filled 2022-02-26: qty 2

## 2022-02-26 MED ORDER — POLYVINYL ALCOHOL 1.4 % OP SOLN
1.0000 [drp] | Freq: Four times a day (QID) | OPHTHALMIC | Status: DC | PRN
  Administered 2022-02-26: 1 [drp] via OPHTHALMIC
  Filled 2022-02-26: qty 15

## 2022-02-26 MED ORDER — GLYCOPYRROLATE 0.2 MG/ML IJ SOLN: 0.2000 mg | INTRAMUSCULAR | Status: DC | PRN

## 2022-02-26 MED ORDER — FENTANYL CITRATE (PF) 100 MCG/2ML IJ SOLN
INTRAMUSCULAR | Status: AC
  Filled 2022-02-26: qty 2

## 2022-02-26 MED ORDER — GLYCOPYRROLATE 1 MG PO TABS
1.0000 mg | ORAL_TABLET | ORAL | Status: DC | PRN
  Filled 2022-02-26: qty 1

## 2022-02-26 MED ORDER — MORPHINE SULFATE (PF) 2 MG/ML IV SOLN
2.0000 mg | INTRAVENOUS | Status: DC | PRN
  Administered 2022-02-26 – 2022-02-28 (×6): 2 mg via INTRAVENOUS
  Filled 2022-02-26 (×3): qty 1

## 2022-02-26 MED ORDER — HALOPERIDOL LACTATE 5 MG/ML IJ SOLN: 0.5000 mg | INTRAMUSCULAR | Status: DC | PRN

## 2022-02-26 MED ORDER — BIOTENE DRY MOUTH MT LIQD: 15.0000 mL | OROMUCOSAL | Status: DC | PRN

## 2022-02-26 MED ORDER — SODIUM CHLORIDE 0.9 % IV SOLN
250.0000 mg | Freq: Once | INTRAVENOUS | Status: DC
  Filled 2022-02-26 (×2): qty 5

## 2022-02-26 MED ORDER — ACETAMINOPHEN 325 MG PO TABS: 650.0000 mg | ORAL_TABLET | Freq: Four times a day (QID) | ORAL | Status: DC | PRN

## 2022-02-26 MED ORDER — LACTULOSE 10 GM/15ML PO SOLN: 20.0000 g | Freq: Four times a day (QID) | ORAL | Status: DC

## 2022-02-26 MED ORDER — ONDANSETRON HCL 4 MG/2ML IJ SOLN
4.0000 mg | Freq: Four times a day (QID) | INTRAMUSCULAR | Status: DC | PRN
Start: 2022-02-26 — End: 2022-03-02

## 2022-02-26 MED ORDER — MIDAZOLAM HCL (PF) 5 MG/ML IJ SOLN
INTRAMUSCULAR | Status: AC
  Filled 2022-02-26: qty 2

## 2022-02-26 MED ORDER — ACETAMINOPHEN 650 MG RE SUPP: 650.0000 mg | Freq: Four times a day (QID) | RECTAL | Status: DC | PRN

## 2022-02-26 MED ORDER — ONDANSETRON 4 MG PO TBDP
4.0000 mg | ORAL_TABLET | Freq: Four times a day (QID) | ORAL | Status: DC | PRN
  Filled 2022-02-26: qty 1

## 2022-02-26 MED ORDER — GLYCOPYRROLATE 0.2 MG/ML IJ SOLN
0.2000 mg | INTRAMUSCULAR | Status: DC | PRN
  Administered 2022-02-26 – 2022-03-01 (×2): 0.2 mg via INTRAVENOUS
  Filled 2022-02-26 (×2): qty 1

## 2022-02-26 MED ORDER — SODIUM CHLORIDE 0.9 % IV SOLN
50.0000 ug/h | INTRAVENOUS | Status: DC
  Administered 2022-02-26: 50 ug/h via INTRAVENOUS
  Filled 2022-02-26: qty 1

## 2022-02-26 MED ORDER — HALOPERIDOL 0.5 MG PO TABS
0.5000 mg | ORAL_TABLET | ORAL | Status: DC | PRN
  Filled 2022-02-26: qty 1

## 2022-02-26 NOTE — Progress Notes (Addendum)
? ?NAME:  Harold Howard, MRN:  [id number], DOB:  1981-07-21, LOS: 13 ?ADMISSION DATE:  01/30/2022, CONSULTATION DATE:  02/01/2022 ?REFERRING MD:  Horton-EDP, CHIEF COMPLAINT:  hemorrhagic shock  ? ?History of Present Illness:  ?Harold Howard is a 41 y/o gentleman with a history of daily ETOH use who presents with 1 month of vomiting blood and dark stools consistent with upper GI bleed s/p EGD on 2/19 with banding of esophageal varices.  ? ?History is provided by the ED and his step sister as he is intubated and sedated. His family is estranged due to his ETOH consumption. His step-sister indicated she is the only family he is in contact with. He drinks about a fifth per day, but has been working on cutting back recently, but he still drinks about a half gallon of liquor every 2-3 days. He came home from work early yesterday after texting his roommate a picture of a toilet bowl full of BRB. He was found on the floor this morning screaming to call 911. No emesis with EMS. Received 500cc crystalloid and zofran. He was hypotensive and developed worsening encephalopathy in the ED and required intubation. He had a seizure in the ED.  He received 8 pRBCs. PCCM consulted for admission. GI consulted by the ED. ? ?No known use of NSAIDs or anticoagulants. His step-sister is not clear on his medical history. ? ?Pertinent  Medical History  ?ETOH abuse ? ?Significant Hospital Events: ?Including procedures, antibiotic start and stop dates in addition to other pertinent events   ?2/19 admission, intubated sedated, EGD  ?2/20 paracentesis  ?2/22 patient self-extubated, off pressors  ?2/27 Recurrent upper GI bleed on the floor, stabilized and transferred back to the ICU. GI notified  ?2/28 Underwent MTP twice afternoon of 2/27 he had TIPS with embolectomy but unfortunately re-bleed post TIPS  ?3/1 no further severe bleeding episodes since early am of 2/28 ?3/2 OGT placed started on ED ?3/3 SBT 12/5 but still quite sedated (RASS -3, goal  0)  ?3/4 Started bleeding again, GI  reconsulted ? ?Procedures:  ?2/19 EGD: EGD yesterday with large esophageal varices, suspected gastric varices, and portal hypertensive gastropathy  ?2/27 Upper EGD with large varices unable to identify bleeding site  ?2/27 TIPS with embolization of splenic and portal tirbutaries to varices  ?2/28 Upper EGD with Red blood and significant clot burden was found in the stomach, visualization was quite poor. Pylorus reached. No clear source of active bleeding coming from the stomach ? ?Antibiotics:  ?2/19 Ceftriaxone for SBP prophylaxis  ?Rifaximin 3/2>  ? ?Interim History / Subjective:  ? ?Remains off pressors ?Patient started having upper GI bleeding again, passing large amount of clots and fresh bleeding through [place] ? ? ?Objective   ?Blood pressure (!) 152/76, pulse (!) 112, temperature (!) 100.7 ?F (38.2 ?C), temperature source Oral, resp. rate 10, height 5\' 9"  (1.753 m), weight 93.6 kg, SpO2 96 %. ?   ?Vent Mode: (P) PRVC ?FiO2 (%):  [40 %] (P) 40 % ?Set Rate:  [20 bmp] (P) 20 bmp ?Vt Set:  [560 mL] (P) 560 mL ?PEEP:  [5 cmH20] (P) 5 cmH20 ?Pressure Support:  [5 cmH20-12 cmH20] 5 cmH20 ?Plateau Pressure:  [15 cmH20] 15 cmH20  ? ?Intake/Output Summary (Last 24 hours) at 03-11-22 1220 ?Last data filed at 11-Mar-2022 1100 ?Gross per 24 hour  ?Intake 1470.57 ml  ?Output 2790 ml  ?Net -1319.43 ml  ? ?Filed Weights  ? 02/23/22 0336 02/24/22 0500 03/11/2022 0500  ?Weight: 103.7  kg 97.8 kg 93.6 kg  ? ? ?Examination: ?General: Critically ill appearing middle aged, orally intubated ?HEENT: NCAT ETT secure OGT secure. Dried blood oropharynx.  ?Neuro: Opens eyes with vocal stimuli, intermittently following few commands ?CV: rrr s1s2 cap refill < 3 sec  ?PULM:  Diminished bibasilar sounds. Even unlabored on PSV 12/5 with Vt 700  ?GI:  Distended. Hypoactive. BMS.  Positive fluid thrill ?GU: edematous scrotum. Foley  ?Extremities: BLE 3+ edema 2+ edema BUE. No acute joint deformity  ?Skin: pale  c/d/w no anterior rash  ? ?Resolved Hospital Problem list   ?Hypocalcemia  ?Severe metabolic acidosis ?Hemorrhagic shock  ?Hyperkalemia  ? ?Assessment & Plan:  ? ?Acute metabolic encephalopathy, multifactorial  ?-hemorrhagic shock, decompensated cirrhosis with hyperammonemia, metabolic acidosis, sedating medications ?Patient remains off vasopressors ?Had 300 cc of stool in last 24 hours ?Ammonia remains elevated to 78 ?Increase lactulose to 3 times daily ?He is off sedation ?Delirium precautions  ? ?Acute hypoxic respiratory failure with aspiration pneumonia due to massive hematemesis due to UGIB, requiring intubation  ?Patient is tolerating spontaneous breathing trial but his mental status remain poor ?He also looks volume overloaded ?Continue IV Lasix ?WUA/SBT as tolerated  -- 3/3 is on PSV (12/5) but is still over-sedated  ?VAP, pulm hygiene ? ?Recurrent acute GIB ?Esophageal varices s/p banding x11 ?EtOH abuse ?Decompensated alcoholic cirrhosis with tense ascites  ?Coagulopathy ?ABLA ?Thrombocytopenia  ?MELD score 24 ?S/p MTP, TIPS / embo on 2/27, rebled requiring second MTP (has rcvd >60 PRBC) ?GI was reconsulted ?Patient is off vasopressors ?S/p phenobarb taper  ?Had Faera done, SBP was ruled out ?Completed 5-day course of ceftriaxone twice for SBP prophylaxis ?Trend CBC, transfuse if Hb is <7 ?Continue rifaximin  ?Will require repeat to help improve work of breathing caused by abdominal pressure on the lungs leading to atelectasis ? ?Volume overload due to MTP in setting of hemorrhagic shock  ?Continue aggressive diuresis ? ?Seizure, provoked  ?Likely due to profound hypoperfusion and hypoxia  ?No more seizures ? ?Hypomagnesemia  ?Hypokalemia ?Continue aggressive electrolyte supplement and monitor ? ?Moderate malnutrition ?Nutritionist is following ? ? ?Best Practice (right click and "Reselect all SmartList Selections" daily)  ? ?Diet/type: tubefeeds and NPO ?DVT prophylaxis: prophylactic heparin  ?GI  prophylaxis: PPI ?Lines: Central line, yes still needed ?Foley:  Yes, and it is still needed ?Code Status:  DNR ?Last date of multidisciplinary goals of care discussion [patient's son was updated over the phone on 3/4, decision was to continue full scope of care while keeping him DNR] ? ? ?Total critical care time: 51 minutes ? ?Performed by: Cheri Fowler ?  ?Critical care time was exclusive of separately billable procedures and treating other patients. ?  ?Critical care was necessary to treat or prevent imminent or life-threatening deterioration. ?  ?Critical care was time spent personally by me on the following activities: development of treatment plan with patient and/or surrogate as well as nursing, discussions with consultants, evaluation of patient's response to treatment, examination of patient, obtaining history from patient or surrogate, ordering and performing treatments and interventions, ordering and review of laboratory studies, ordering and review of radiographic studies, pulse oximetry and re-evaluation of patient's condition. ?  ?Cheri Fowler MD ?[facility] ?See Amion for pager ?If no response to pager, please call [phone] until 7pm ?After 7pm, Please call E-link [phone] ? ? ? ?

## 2022-02-26 NOTE — Progress Notes (Signed)
Patient underwent emergent EGD by GI, which showed active bleeding in lower esophagus and large amount of clots noted in the stomach but no source of bleeding was pinpointed.  Per GI this is unfortunately terminal and there is nothing much to offer considering the amount of bleeding and unable to find the source. ? ?Patient received 2 units of PRBCs ? ?After EGD I spoke with patient's son Harold Howard and his significant other Harold Howard and updated about patient's current condition that unfortunately there is nothing more to offer from this point on considering he is actively bleeding and there is no way to stop it. ?He is on octreotide and Protonix infusion without much improvement ? ?Patient's son and his significant other recommend to keep patient comfortable without aggressive intervention. ? ?Patient is DNR/DNI, comfort care orders were written ? ? ? ?Additional critical care time: 31 minutes ? ?Performed by: Cheri Fowler ?  ?Critical care time was exclusive of separately billable procedures and treating other patients. ?  ?Critical care was necessary to treat or prevent imminent or life-threatening deterioration. ?  ?Critical care was time spent personally by me on the following activities: development of treatment plan with patient and/or surrogate as well as nursing, discussions with consultants, evaluation of patient's response to treatment, examination of patient, obtaining history from patient or surrogate, ordering and performing treatments and interventions, ordering and review of laboratory studies, ordering and review of radiographic studies, pulse oximetry and re-evaluation of patient's condition. ?  ?Cheri Fowler MD ?[facility] ?See Amion for pager ?If no response to pager, please call [phone] until 7pm ?After 7pm, Please call E-link [phone] ? ?

## 2022-02-26 NOTE — Progress Notes (Signed)
Interventional Radiology Brief Note: ? ?IR following peripherally s/p TIPS procedure 02/17/2022 by Dr. Vernard Gambles.  ? ?No further GI bleeds.  Hgb stable at 10.7 for several days. Has been able to wean from all pressors and initiate diuresis.  Mental status currently largest barrier to extubation.   ? ?Liver function appears stable based on labs (3/2). Tbili 5.5, AST/ALT WNL.  ? ?On lactulose.  ?Ammonia stable at 75. ? ?Brynda Greathouse, MS RD PA-C ? ? ? ? ?

## 2022-02-26 NOTE — Interval H&P Note (Signed)
History and Physical Interval Note: ? ?03/06/2022 ?2:12 PM ? ?CCM reported that the patient was bleeding again.  An emergent EGD will be performed. ? ?Harold Howard  has presented today for surgery, with the diagnosis of GI bleed.  The various methods of treatment have been discussed with the patient and family. After consideration of risks, benefits and other options for treatment, the patient has consented to  Procedure(s): ?ESOPHAGOGASTRODUODENOSCOPY (EGD) (N/A) as a surgical intervention.  The patient's history has been reviewed, patient examined, no change in status, stable for surgery.  I have reviewed the patient's chart and labs.  Questions were answered to the patient's satisfaction.   ? ? ?Halo Shevlin D ? ? ?

## 2022-02-26 NOTE — Progress Notes (Signed)
The patient is exsanguinating.  An EGD was attempted but a voluminous amount of hematemesis with gigantic clots was noted.  All the blood covered his face.  Even if the EGD as possible visualization would be impossible.  Given this amount of bleeding it is improbable that it would be amenable to endoscopic therapy. ?

## 2022-02-26 NOTE — Progress Notes (Signed)
Wasted 3 mg Versed with RN Waymon Budge.  ?

## 2022-02-26 NOTE — Progress Notes (Signed)
RT note-Patient placed back to full support due to excessive bloody emesis with large clots, continue to monitor. ?

## 2022-02-27 ENCOUNTER — Encounter (HOSPITAL_COMMUNITY): Payer: Self-pay | Admitting: Gastroenterology

## 2022-02-27 DIAGNOSIS — Z931 Gastrostomy status: Secondary | ICD-10-CM

## 2022-02-27 MED ORDER — ACETAMINOPHEN 650 MG RE SUPP: 650.0000 mg | Freq: Four times a day (QID) | RECTAL | Status: DC | PRN

## 2022-02-27 MED ORDER — MORPHINE 100MG IN NS 100ML (1MG/ML) PREMIX INFUSION
1.0000 mg/h | INTRAVENOUS | Status: DC
  Administered 2022-02-27: 2 mg/h via INTRAVENOUS
  Administered 2022-02-27 – 2022-02-28 (×2): 8 mg/h via INTRAVENOUS
  Administered 2022-02-28 – 2022-03-02 (×5): 10 mg/h via INTRAVENOUS
  Filled 2022-02-27 (×8): qty 100

## 2022-02-27 MED ORDER — DIPHENHYDRAMINE HCL 50 MG/ML IJ SOLN: 25.0000 mg | INTRAMUSCULAR | Status: DC | PRN

## 2022-02-27 NOTE — Plan of Care (Signed)
?  Problem: Activity: ?Goal: Ability to tolerate increased activity will improve ?Outcome: Completed/Met ?Note: Comfort Care ?  ?Problem: Respiratory: ?Goal: Ability to maintain a clear airway and adequate ventilation will improve ?Outcome: Completed/Met ?Note: Comfort Care ?  ?Problem: Role Relationship: ?Goal: Method of communication will improve ?Outcome: Completed/Met ?Note: Comfort Care ?  ?Problem: Activity: ?Goal: Ability to tolerate increased activity will improve ?Outcome: Completed/Met ?Note: Comfort Care ?  ?Problem: Respiratory: ?Goal: Ability to maintain a clear airway and adequate ventilation will improve ?Outcome: Completed/Met ?Note: Comfort Care ?  ?Problem: Role Relationship: ?Goal: Method of communication will improve ?Outcome: Completed/Met ?Note: Comfort Care ?  ?

## 2022-02-27 NOTE — Progress Notes (Signed)
? ?NAME:  Harold Howard, MRN:  [id number], DOB:  10/17/1981, LOS: 27 ?ADMISSION DATE:  02/12/2022, CONSULTATION DATE:  02/06/2022 ?REFERRING MD:  Horton-EDP, CHIEF COMPLAINT:  hemorrhagic shock  ? ?History of Present Illness:  ?Mr. Harold Howard is a 41 y/o gentleman with a history of daily ETOH use who presents with 1 month of vomiting blood and dark stools consistent with upper GI bleed s/p EGD on 2/19 with banding of esophageal varices.  ? ?History is provided by the ED and his step sister as he is intubated and sedated. His family is estranged due to his ETOH consumption. His step-sister indicated she is the only family he is in contact with. He drinks about a fifth per day, but has been working on cutting back recently, but he still drinks about a half gallon of liquor every 2-3 days. He came home from work early yesterday after texting his roommate a picture of a toilet bowl full of BRB. He was found on the floor this morning screaming to call 911. No emesis with EMS. Received 500cc crystalloid and zofran. He was hypotensive and developed worsening encephalopathy in the ED and required intubation. He had a seizure in the ED.  He received 8 pRBCs. PCCM consulted for admission. GI consulted by the ED. ? ?No known use of NSAIDs or anticoagulants. His step-sister is not clear on his medical history. ? ?Pertinent  Medical History  ?ETOH abuse ? ?Significant Hospital Events: ?Including procedures, antibiotic start and stop dates in addition to other pertinent events   ?2/19 admission, intubated sedated, EGD  ?2/20 paracentesis  ?2/22 patient self-extubated, off pressors  ?2/27 Recurrent upper GI bleed on the floor, stabilized and transferred back to the ICU. GI notified  ?2/28 Underwent MTP twice afternoon of 2/27 he had TIPS with embolectomy but unfortunately re-bleed post TIPS  ?3/1 no further severe bleeding episodes since early am of 2/28 ?3/2 OGT placed started on ED ?3/3 SBT 12/5 but still quite sedated (RASS -3, goal  0)  ? ?Procedures:  ?2/19 EGD: EGD yesterday with large esophageal varices, suspected gastric varices, and portal hypertensive gastropathy  ?2/27 Upper EGD with large varices unable to identify bleeding site  ?2/27 TIPS with embolization of splenic and portal tirbutaries to varices  ?2/28 Upper EGD with Red blood and significant clot burden was found in the stomach, visualization was quite poor. Pylorus reached. No clear source of active bleeding coming from the stomach ?3/4 rebled. Determined not to be a candidate for endoscopic intervention. Transitioned to comfort care ?3/5 adding morphine gtt, transfer out of ICU  ? ?Antibiotics:  ?2/19 -3/4Ceftriaxone for SBP prophylaxis  ?Rifaximin 3/2> 3/4 ? ?Interim History / Subjective:  ? ?On 290mcg fent gtt this morning moaning in pain ?Says he feels like he has no energy and is so weak and tired  ? ? ?Objective   ?Blood pressure (!) 117/56, pulse (!) 110, temperature 98.1 ?F (36.7 ?C), temperature source Axillary, resp. rate 13, height 5\' 9"  (1.753 m), weight 93.6 kg, SpO2 96 %. ?   ?Vent Mode: PRVC ?FiO2 (%):  [40 %] 40 % ?Set Rate:  [20 bmp] 20 bmp ?Vt Set:  [560 mL] 560 mL ?PEEP:  [5 cmH20] 5 cmH20 ?Pressure Support:  [5 cmH20] 5 cmH20 ?Plateau Pressure:  [19 cmH20] 19 cmH20  ? ?Intake/Output Summary (Last 24 hours) at 02/27/2022 0926 ?Last data filed at 02/27/2022 0800 ?Gross per 24 hour  ?Intake 722.94 ml  ?Output 2525 ml  ?Net -1802.06 ml  ? ?  Filed Weights  ? 02/23/22 0336 02/24/22 0500 03/12/2022 0500  ?Weight: 103.7 kg 97.8 kg 93.6 kg  ? ? ?Examination: ?General: Critically ill middle aged M, uncomfortable in bed  ?HEENT: Dried blood on face, oropharynx  ?Neuro: awake, oriented to self  ?CV:  tachycardic, regular  ?PULM:  no accessory muscle use  ?GI:  distended  ?GU: foley ?Extremities: no acute joint deformity ?Skin: pale  ? ?Resolved Hospital Problem list   ?Hypocalcemia  ?Severe metabolic acidosis ?Hemorrhagic shock  ?Hyperkalemia  ? ?Assessment & Plan:   ? ? ? ?Acute metabolic encephalopathy ?Hyperammonemia ?Decompensated alcoholic cirrhosis ?Hemorrhagic shock ?ABLA ?UGIB sp TIPS with rebleed  ?Bleeding esophageal varices s/p banding x 11, with rebleed  ?Acute respiratory insufficiency ?Hematemesis  ?Coagulopathy ?Thrombocytopenia  ?Seizure, provoked ?Hypokalemia ?Hypomagnesemia  ?Encounter for palliative care ?DNR status ?-pt rebled 3/4 and started to exsanguinate. He was transfused PRBC and seen again urgently by GI who attempted EGD but with amount of bleeding was not successful. It was discussed that there are not options remaining and the patient was compassionately transitioned to comfort care ?P ?-adding morphine gtt, will come off of fent gtt  ?-cont PRN BZDs and additional PRN pain meds as needed for comfort ?-unrestricted visitation ?-cont emotional support for pt and family ?-would check on pt often given risk of hematemesis/exsanguination (would be distressing to experience, potentially graphic for family)  ?-will move out of ICU and to palliative floor  ? ?Best Practice (right click and "Reselect all SmartList Selections" daily)  ? ?Diet/type: NPO ?DVT prophylaxis: not indicated ?GI prophylaxis: N/A ?Lines- n/a    ?Foley:  Yes, and it is still needed ?Code Status:  DNR ?Last date of multidisciplinary goals of care discussion [ 3/4 transitioned to comfort care] ? ?CCT: n/a  ? ? ?Eliseo Gum MSN, AGACNP-BC ?[facility] ?Amion for pager ?02/27/2022, 9:26 AM ? ? ? ? ? ?

## 2022-02-28 NOTE — Progress Notes (Signed)
GF and grandmother updated via telephone of patient transferring to [phone]. Huntley Estelle E, RN ?02/28/2022 ?11:31 AM ? ?

## 2022-02-28 NOTE — Progress Notes (Signed)
Nutrition Brief Note  Chart reviewed. Pt now transitioning to comfort care.  No further nutrition interventions planned at this time.  Please re-consult as needed.   Oiva Dibari, RD, LDN Clinical Dietitian RD pager # available in AMION  After hours/weekend pager # available in AMION   

## 2022-02-28 NOTE — Progress Notes (Signed)
Patient arrived to [facility] room 23. Bed in lowest position call light in reach ?

## 2022-02-28 NOTE — Progress Notes (Signed)
?PROGRESS NOTE ? ? ? ?Harold Howard  AST:[id number] DOB: April 05, 1981 DOA: 02/12/2022 ?PCP: Patient, No Pcp Per (Inactive)  ? ? ?Brief Narrative:  ?41 year old gentleman with history of alcoholism, alcoholic liver cirrhosis complicated with hemorrhagic shock, recurrent upper GI bleeding from esophageal varices status post banding and TIPS procedure.  Was admitted to the intensive care unit for last 2 weeks.  He had recurrent bleeding, taken to upper GI endoscopy and found to have exsanguinating bleeding unable to treat and ultimately treated with comfort care and hospice starting 3/5. ?Patient was found at home on the floor screaming to call 911 by his stepsister.  Was hypotensive and developed encephalopathy on the way to ER required intubation.  He had seizure in the ER.  Patient received total of 8 units of PRBC. ? ? ?Assessment & Plan: ?  ?Hemorrhagic shock secondary to upper GI bleeding, acute blood loss anemia. ?Acute hepatic encephalopathy ?Decompensated alcoholic liver cirrhosis ?Acute blood loss anemia with hemorrhagic shock, upper GI bleeding from bleeding esophageal varices ?Acute hypoxemic respiratory failure secondary to aspiration ?Electrolyte abnormalities ? ?Patient was placed on comfort care after not responding to treatment. ?Currently remains on morphine infusion for comfort, other comfort care medications available. ?Transfer out of ICU when bed available.  ?Hospital death anticipated. ?RN to pronounce death if happens in the hospital. ? ? ?DVT prophylaxis: SCDs Start: 01/27/2022 1218 ? ? ?Code Status: Comfort care ?Family Communication: None today ?Disposition Plan: Status is: Inpatient ?Remains inpatient appropriate because: Hospital death anticipated ?  ? ? ?Consultants:  ?Critical care ?Gastroenterology ? ?Procedures:  ?TIPS ?EGD ? ?Antimicrobials:  ?None ? ? ?Subjective: ?Patient seen and examined.  No family at bedside.  No overnight events.  Remains obtunded.  Looks  comfortable. ? ?Objective: ?Vitals:  ? 02/28/22 0800 02/28/22 0900 02/28/22 0934 02/28/22 1000  ?BP:      ?Pulse:      ?Resp: 18 18 19  (!) 23  ?Temp:      ?TempSrc:      ?SpO2:      ?Weight:      ?Height:      ? ? ?Intake/Output Summary (Last 24 hours) at 02/28/2022 1051 ?Last data filed at 02/28/2022 1000 ?Gross per 24 hour  ?Intake 256.61 ml  ?Output 1875 ml  ?Net -1618.39 ml  ? ?Filed Weights  ? 02/23/22 0336 02/24/22 0500 03/18/2022 0500  ?Weight: 103.7 kg 97.8 kg 93.6 kg  ? ? ?Examination: ? ?Sick looking gentleman, icteric and with anasarca.  Obtunded.  Unable to interact or follow commands. ?Looks comfortable on current regimen of comfort care medications. ? ? ?Data Reviewed: I have personally reviewed following labs and imaging studies ? ?CBC: ?Recent Labs  ?Lab 02/03/2022 ?1000 02/23/22 ?0636 02/24/22 ?1051 02/25/22 ?0655 03/08/2022 ?0115 03/03/2022 ?1231  ?WBC 12.1* 13.7*  --  13.2* 14.8* 16.6*  ?HGB 11.1* 10.2* 10.9* 10.5* 10.7* 9.7*  ?HCT 30.2* 28.2* 31.9* 31.9* 32.1* 30.7*  ?MCV 84.4 85.7  --  93.5 93.9 97.8  ?PLT 58* 61*  --  68* 67* 62*  ? ?Basic Metabolic Panel: ?Recent Labs  ?Lab 02/09/2022 ?2145 01/29/2022 ?2146 02/23/22 ?04/25/22 02/24/22 ?0209 02/25/22 ?04/27/22 03/18/2022 ?0115 03/01/2022 ?1231  ?NA 136   < > 138 138 139 143 147*  ?K 3.8   < > 3.6 3.8 4.9 3.4* 3.8  ?CL 105   < > 108 106 109 106 111  ?CO2 23   < > 25 24 26 28 29   ?GLUCOSE 154*   < >  113* 107* 97 138* 136*  ?BUN 17   < > 24* 28* 27* 26* 31*  ?CREATININE 0.86   < > 0.94 1.10 0.96 0.86 0.85  ?CALCIUM 7.4*   < > 7.8* 7.9* 7.6* 8.3* 8.3*  ?MG 1.3*  --  1.5* 1.7 1.8  --  2.0  ?PHOS 5.3*  --   --   --  3.8  --   --   ? < > = values in this interval not displayed.  ? ?GFR: ?Estimated Creatinine Clearance: 130.6 mL/min (by C-G formula based on SCr of 0.85 mg/dL). ?Liver Function Tests: ?Recent Labs  ?Lab 03/04/2022 ?2146 01/27/2022 ?0118 01/31/2022 ?1000 02/24/22 ?0209  ?AST 41 32 45* 74*  ?ALT 21 16 21 30   ?ALKPHOS 39 30* 35* 67  ?BILITOT 4.9* 4.5* 7.2* 5.5*  ?PROT 3.4*  3.1* 3.4* 3.9*  ?ALBUMIN 1.9* 2.0* 2.0* 1.9*  ? ?No results for input(s): LIPASE, AMYLASE in the last 168 hours. ?Recent Labs  ?Lab 02/23/22 ?0905 02/25/2022 ?0115  ?AMMONIA 78* 75*  ? ?Coagulation Profile: ?Recent Labs  ?Lab 03/04/22 ?2145 01/27/2022 ?0118 01/26/2022 ?1000 02/23/22 ?1312  ?INR 1.5* 1.8* 1.3* 1.4*  ? ?Cardiac Enzymes: ?No results for input(s): CKTOTAL, CKMB, CKMBINDEX, TROPONINI in the last 168 hours. ?BNP (last 3 results) ?No results for input(s): PROBNP in the last 8760 hours. ?HbA1C: ?No results for input(s): HGBA1C in the last 72 hours. ?CBG: ?Recent Labs  ?Lab 02/25/22 ?2354 03/07/2022 ?0408 03/12/2022 ?0753 03/16/2022 ?1147 03/10/2022 ?1523  ?GLUCAP 124* 135* 144* 135* 130*  ? ?Lipid Profile: ?No results for input(s): CHOL, HDL, LDLCALC, TRIG, CHOLHDL, LDLDIRECT in the last 72 hours. ?Thyroid Function Tests: ?No results for input(s): TSH, T4TOTAL, FREET4, T3FREE, THYROIDAB in the last 72 hours. ?Anemia Panel: ?No results for input(s): VITAMINB12, FOLATE, FERRITIN, TIBC, IRON, RETICCTPCT in the last 72 hours. ?Sepsis Labs: ?No results for input(s): PROCALCITON, LATICACIDVEN in the last 168 hours. ? ?No results found for this or any previous visit (from the past 240 hour(s)).  ? ? ? ? ? ?Radiology Studies: ?No results found. ? ? ? ? ? ?Scheduled Meds: ? sodium chloride   Intravenous Once  ? sodium chloride   Intravenous Once  ? Chlorhexidine Gluconate Cloth  6 each Topical Daily  ? ?Continuous Infusions: ? sodium chloride Stopped (03/20/2022 1209)  ? morphine 10 mg/hr (02/28/22 1000)  ? ? ? LOS: 15 days  ? ? ?Time spent: 35 minutes ? ? ? ?04/30/22, MD ?[facility] ?Pager [phone]  ?

## 2022-03-01 NOTE — Progress Notes (Signed)
?PROGRESS NOTE ? ? ? ?Harold Howard  PJA:[id number] DOB: 1981-05-10 DOA: 02/05/2022 ?PCP: Patient, No Pcp Per (Inactive)  ? ? ?Brief Narrative:  ?41 year old gentleman with history of alcoholism, alcoholic liver cirrhosis complicated with hemorrhagic shock, recurrent upper GI bleeding from esophageal varices status post banding and TIPS procedure.  Was admitted to the intensive care unit for last 2 weeks.  He had recurrent bleeding, taken to upper GI endoscopy and found to have exsanguinating bleeding unable to treat and ultimately treated with comfort care and hospice starting 3/5. ?Patient was found at home on the floor screaming to call 911 by his stepsister.  Was hypotensive and developed encephalopathy on the way to ER required intubation.  He had seizure in the ER.  Patient received total of 8 units of PRBC. ? ? ?Assessment & Plan: ?  ?Hemorrhagic shock secondary to upper GI bleeding, acute blood loss anemia. ?Acute hepatic encephalopathy ?Decompensated alcoholic liver cirrhosis ?Acute blood loss anemia with hemorrhagic shock, upper GI bleeding from bleeding esophageal varices ?Acute hypoxemic respiratory failure secondary to aspiration ?Electrolyte abnormalities ? ?Patient was placed on comfort care after not responding to treatment. ?Currently remains on morphine infusion for comfort, other comfort care medications available. ?Hospital death anticipated. ?RN to pronounce death if happens in the hospital. ? ? ?DVT prophylaxis: SCDs Start: 02/12/2022 1218 ? ? ?Code Status: Comfort care ?Family Communication: None today ?Disposition Plan: Status is: Inpatient ?Remains inpatient appropriate because: Hospital death anticipated ?  ? ? ?Consultants:  ?Critical care ?Gastroenterology ? ?Procedures:  ?TIPS ?EGD ? ?Antimicrobials:  ?None ? ? ?Subjective: ? ?Patient seen in the morning rounds.  Remains obtunded with irregular breathing.  Looks comfortable. ? ?Objective: ?Vitals:  ? 02/28/22 2133 03/01/22 0412 03/01/22  0414 03/01/22 0500  ?BP: 115/67 124/65 124/65   ?Pulse: (!) 112 (!) 126 (!) 124   ?Resp: 20 20 20    ?Temp: (!) 101.8 ?F (38.8 ?C) 99.9 ?F (37.7 ?C) 99.9 ?F (37.7 ?C)   ?TempSrc: Oral Oral Oral   ?SpO2: 98% 93% 93%   ?Weight:    88.2 kg  ?Height:      ? ? ?Intake/Output Summary (Last 24 hours) at 03/01/2022 1415 ?Last data filed at 03/01/2022 1350 ?Gross per 24 hour  ?Intake 181.28 ml  ?Output 2350 ml  ?Net -2168.72 ml  ? ?Filed Weights  ? 02/24/22 0500 03/10/2022 0500 03/01/22 0500  ?Weight: 97.8 kg 93.6 kg 88.2 kg  ? ? ?Examination: ? ?Sick looking gentleman, currently on comfort care measures.  Does not respond to voice or stimulation. ?Irregular breathing patterns present. ?Icteric and pale. ? ? ?Data Reviewed: I have personally reviewed following labs and imaging studies ? ?CBC: ?Recent Labs  ?Lab 02/23/22 ?0636 02/24/22 ?1051 02/25/22 ?0655 03/05/2022 ?0115 03/25/2022 ?1231  ?WBC 13.7*  --  13.2* 14.8* 16.6*  ?HGB 10.2* 10.9* 10.5* 10.7* 9.7*  ?HCT 28.2* 31.9* 31.9* 32.1* 30.7*  ?MCV 85.7  --  93.5 93.9 97.8  ?PLT 61*  --  68* 67* 62*  ? ?Basic Metabolic Panel: ?Recent Labs  ?Lab 02/23/22 ?04/25/22 02/24/22 ?0209 02/25/22 ?04/27/22 03/23/2022 ?0115 03/05/2022 ?1231  ?NA 138 138 139 143 147*  ?K 3.6 3.8 4.9 3.4* 3.8  ?CL 108 106 109 106 111  ?CO2 25 24 26 28 29   ?GLUCOSE 113* 107* 97 138* 136*  ?BUN 24* 28* 27* 26* 31*  ?CREATININE 0.94 1.10 0.96 0.86 0.85  ?CALCIUM 7.8* 7.9* 7.6* 8.3* 8.3*  ?MG 1.5* 1.7 1.8  --  2.0  ?  PHOS  --   --  3.8  --   --   ? ?GFR: ?Estimated Creatinine Clearance: 127 mL/min (by C-G formula based on SCr of 0.85 mg/dL). ?Liver Function Tests: ?Recent Labs  ?Lab 02/24/22 ?0209  ?AST 74*  ?ALT 30  ?ALKPHOS 67  ?BILITOT 5.5*  ?PROT 3.9*  ?ALBUMIN 1.9*  ? ?No results for input(s): LIPASE, AMYLASE in the last 168 hours. ?Recent Labs  ?Lab 02/23/22 ?0905 03/17/2022 ?0115  ?AMMONIA 78* 75*  ? ?Coagulation Profile: ?Recent Labs  ?Lab 02/23/22 ?1312  ?INR 1.4*  ? ?Cardiac Enzymes: ?No results for input(s): CKTOTAL, CKMB,  CKMBINDEX, TROPONINI in the last 168 hours. ?BNP (last 3 results) ?No results for input(s): PROBNP in the last 8760 hours. ?HbA1C: ?No results for input(s): HGBA1C in the last 72 hours. ?CBG: ?Recent Labs  ?Lab 02/25/22 ?2354 03/15/2022 ?0408 03/15/2022 ?0753 02/28/2022 ?1147 03/17/2022 ?1523  ?GLUCAP 124* 135* 144* 135* 130*  ? ?Lipid Profile: ?No results for input(s): CHOL, HDL, LDLCALC, TRIG, CHOLHDL, LDLDIRECT in the last 72 hours. ?Thyroid Function Tests: ?No results for input(s): TSH, T4TOTAL, FREET4, T3FREE, THYROIDAB in the last 72 hours. ?Anemia Panel: ?No results for input(s): VITAMINB12, FOLATE, FERRITIN, TIBC, IRON, RETICCTPCT in the last 72 hours. ?Sepsis Labs: ?No results for input(s): PROCALCITON, LATICACIDVEN in the last 168 hours. ? ?No results found for this or any previous visit (from the past 240 hour(s)).  ? ? ? ? ? ?Radiology Studies: ?No results found. ? ? ? ? ? ?Scheduled Meds: ? sodium chloride   Intravenous Once  ? sodium chloride   Intravenous Once  ? Chlorhexidine Gluconate Cloth  6 each Topical Daily  ? ?Continuous Infusions: ? sodium chloride Stopped (03/15/2022 1209)  ? morphine 10 mg/hr (03/01/22 1347)  ? ? ? LOS: 16 days  ? ? ?Time spent: 25 minutes ? ? ? ?Dorcas Carrow, MD ?[facility] ?Pager [phone]  ?

## 2022-03-26 NOTE — Death Summary Note (Signed)
DEATH SUMMARY   Patient Details  Name: Harold Howard MRN: [id number] DOB: November 29, 1981 WUJ:[id number], No Pcp Per (Inactive) Admission/Discharge Information   Admit Date:  06-Mar-2022  Date of Death: Date of Death: March 23, 2022  Time of Death: Time of Death: 1400  Length of Stay: 06-02-2023   Principle Cause of death: Hemorrhagic shock secondary to recurrent GI bleeding from esophageal varices due to alcoholic liver cirrhosis.  Hospital Diagnoses: Principal Problem:   Upper GI bleed Active Problems:   ETOH abuse   Hypovolemic shock (HCC)   Bleeding esophageal varices (HCC)   Malnutrition of moderate degree   Alcoholic cirrhosis of liver with ascites (HCC)   Brief narrative: Patient was a 41 year old gentleman with history of alcoholism, alcoholic liver cirrhosis complicated with hemorrhagic shock, recurrent upper GI bleeding from esophageal varices status post banding and TIPS procedure.   On 03-06-22, he was found on the floor at his home screening to call 911. EMS noted him and hypotensive, encephalopathic and needed intubation on the way to ER. While in the ER, he had seizure. He was admitted to the intensive care unit for last 2 weeks.  He had recurrent bleeding, taken to upper GI endoscopy and found to have exsanguinating bleeding unable to treat and ultimately treated with comfort care and hospice starting 3/5. Patient received total of 8 units of PRBC.  Hospital Course: Comfort care status -Patient was in ICU for about 2 weeks, optimal medical management was done.  He continued to have exsanguinating bleeding.  Palliative care consultation was obtained. -Patient was changed to comfort care/hospice on 03-21-23. -Patient passed away today Mar 23, 2022 at 2 PM.  Acute medical issues addressed while in the hospital were Hemorrhagic shock secondary to upper GI bleeding, acute blood loss anemia. Acute hepatic encephalopathy Decompensated alcoholic liver cirrhosis Acute blood loss anemia with  hemorrhagic shock, upper GI bleeding from bleeding esophageal varices Acute hypoxemic respiratory failure secondary to aspiration Electrolyte abnormalities  Procedures Mar 06, 2023 EGD: EGD with large esophageal varices, suspected gastric varices, and portal hypertensive gastropathy  2/27 Upper EGD with large varices unable to identify bleeding site  2/27 TIPS with embolization of splenic and portal tirbutaries to varices  2/28 Upper EGD with Red blood and significant clot burden was found in the stomach, visualization was quite poor. Pylorus reached. No clear source of active bleeding coming from the stomach 3/4 rebled. Determined not to be a candidate for endoscopic intervention. Transitioned to comfort care 3/5 morphine gtt, transfer out of ICU        The results of significant diagnostics from this hospitalization (including imaging, microbiology, ancillary and laboratory) are listed below for reference.   Significant Diagnostic Studies: CT HEAD WO CONTRAST ( )  Result Date: 03-06-2022 CLINICAL DATA:  Seizure, altered mental status EXAM: CT HEAD WITHOUT CONTRAST TECHNIQUE: Contiguous axial images were obtained from the base of the skull through the vertex without intravenous contrast. RADIATION DOSE REDUCTION: This exam was performed according to the departmental dose-optimization program which includes automated exposure control, adjustment of the mA and/or kV according to patient size and/or use of iterative reconstruction technique. COMPARISON:  None. FINDINGS: Brain: No evidence of acute infarction, hemorrhage, hydrocephalus, extra-axial collection or mass lesion/mass effect. Vascular: No hyperdense vessel or unexpected calcification. Skull: Normal. Negative for fracture or focal lesion. Sinuses/Orbits: No acute finding. Other: None. IMPRESSION: No acute intracranial pathology. Electronically Signed   By: Jearld Lesch M.D.   On: 03-06-2022 14:30   IR Tips  Result Date: 02/09/2022 CLINICAL  DATA:  Cirrhosis, portal venous hypertension, GI bleed with esophageal varices post banding, incompletely controlled. Hemodynamic instability. EXAM: 1. TIPS CREATION 2. COIL EMBOLIZATION OF VENOUS VARICES 3. ULTRASOUND-GUIDED VENOUS ACCESS 4. PARACENTESIS WITH ULTRASOUND GUIDANCE ANESTHESIA/SEDATION: General - as administered by the Anesthesia department MEDICATIONS: Per anesthesia department FLUOROSCOPY TIME:  Radiation Exposure Index (as provided by the fluoroscopic device): 1360 mGy Kerma COMPLICATIONS: None immediate. PROCEDURE: Informed written consent was obtained from the family after a thorough discussion of the procedural risks, benefits and alternatives. All questions were addressed. See previous consultation. Maximal Sterile Barrier Technique was utilized including caps, mask, sterile gowns, sterile gloves, sterile drape, hand hygiene and skin antiseptic. A timeout was performed prior to the initiation of the procedure. The skin overlying the right upper abdominal quadrant as well as the right neck and femoral region were prepped and draped in usual sterile fashion. Initial ultrasound scanning demonstrates a moderate amount of recurrent  ascites. Under ultrasound guidance, a 6 [place] Safe-T-Centesis needle was advanced into the peritoneal cavity from a right lateral approach for paracentesis removing 3 L clear yellow ascites. Under direct ultrasound guidance, patency of the right common femoral vein was identified and documented. Micropuncture access under ultrasound guidance was achieved. Dilator exchanged over guidewire for 8 French vascular sheath, through which the ICE ultrasound probe was advanced into the central aspect of the IVC. Next, the right internal jugular vein patency was confirmed and documented with ultrasound, then the vein was accessed under direct ultrasound guidance with micropuncture set. This allowed for placement of the 10 French TIPS vascular sheath. 5 French angled angiographic  catheter was advanced initially to the right hepatic vein and then into the middle hepatic vein under fluoroscopic and ultrasound guidance. Catheter exchanged over guidewire for a ScorpionT TIPS access set, advanced into the central aspect of right portal vein. Angled Glidewire was advanced into the portal vein. The access set was advanced sequentially into the portal vein. This was exchanged over guidewire for a pigtail measuring catheter. Portal venogram was performed demonstrating patency, slow antegrade flow through the main and right portal vein with limited opacification of the left portal vein. Large collaterals to esophageal varices were identified from the proximal portal vein. Pigtail catheter exchanged over guidewire. Tract was dilated with Conquest angioplasty balloon. The 10 French sheath was advanced into the portal vein. Next, a 2 cm (un covered) x 8 cm (covered) x 10 mm diameter GORE VIATORR TIPS Endoprosthesis was advanced through the sheath across the intra hepatic track and deployed. The TIPS stent was angioplastied in multiple stations to 10 mm diameter. Follow-up portal venogram demonstrated good stent deployment and flow, no residual stenosis. No further antegrade flow into the right left portal vein branches evident. Persistent opacification of large collateral channel supplying the esophageal varices. Attention then turned to variceal embolization. Dominant varices from the proximal portal vein and mid splenic vein were occluded with a total of 16 interlock and Ruby coils, to virtual cessation of flow through the channels. Final venogram from the peripheral splenic vein shows excellent antegrade flow through splenic and portal veins, no extravasation or other apparent complication. Catheters and sheaths were removed from the patient.Hemostasis was achieved at the right femoral access site with manual compression. Hemostasis was achieved at the right IJ access site with manual compression. A  dressing was placed. The patient tolerated the procedure well. Patient transferred to the PACU. IMPRESSION: 1. Technically successful TIPS creation. 2. Coil embolization of dominant splenic and portal collateral venous channels supplying esophageal varices.  3. Ultrasound guided paracentesis removing 3 L clear yellow ascites. Electronically Signed   By: Corlis Leak M.D.   On: 02/04/2022 09:07   DG CHEST PORT 1 VIEW  Result Date: 03/05/2022 CLINICAL DATA:  41 year old male intubated. Upper GI bleed. EXAM: PORTABLE CHEST 1 VIEW COMPARISON:  Portable chest 02/06/2022 and earlier. FINDINGS: Portable AP semi upright view at 0449 hours. Right IJ central line removed. Endotracheal tube tip is satisfactory at the level the clavicles. Enteric tube courses to the abdomen, tip not included. Extensive epigastric endovascular coils. Probable superimposed right upper quadrant TIPS. Improved lung volumes. Normal cardiac size and mediastinal contours. Streaky, platelike left lower lobe and right perihilar opacity. Favor atelectasis. No pneumothorax, pulmonary edema or pleural effusion. Paucity of bowel gas in the upper abdomen. IMPRESSION: 1. Removed duct right IJ central line. Satisfactory ET tube. Enteric tube courses to the stomach, tip not included. 2. Improved lung volumes with streaky and plate-like bilateral perihilar opacity, favor atelectasis. 3. Sequelae of TIPS, epigastric vascular embolization. Electronically Signed   By: Odessa Fleming M.D.   On: 03/13/2022 08:19   DG CHEST PORT 1 VIEW  Result Date: 02/09/2022 CLINICAL DATA:  Central line placement. EXAM: PORTABLE CHEST 1 VIEW COMPARISON:  01/31/2022 FINDINGS: 0607 hours. Low volume film. The cardio pericardial silhouette is enlarged. Bibasilar atelectasis/infiltrate noted. Endotracheal tube tip is 2.8 cm above the base of the carina. Right IJ central line tip overlies the right lung apex. No evidence for pneumothorax. Telemetry leads overlie the chest. IMPRESSION: 1.  Right IJ central line tip overlies the right lung apex. No pneumothorax. 2. Bibasilar atelectasis/infiltrate. Electronically Signed   By: Kennith Center M.D.   On: 02/04/2022 07:16   DG CHEST PORT 1 VIEW  Result Date: 02/06/2022 CLINICAL DATA:  Central line placement. EXAM: PORTABLE CHEST 1 VIEW COMPARISON:  02/14/2022 FINDINGS: 1148 hours. Endotracheal tube tip is 4.7 cm above the base of the carina. Right IJ sheath noted with tip overlying the region of the innominate vein confluence. No evidence for pneumothorax. No pulmonary edema or pleural effusion. Probable small hiatal hernia. The cardiopericardial silhouette is within normal limits for size. Telemetry leads overlie the chest. IMPRESSION: 1. Endotracheal tube tip 4.7 cm above the base of the carina. 2. Right IJ sheath with tip overlying the region of the innominate vein confluence. 3. No pulmonary edema or focal consolidation. Electronically Signed   By: Kennith Center M.D.   On: 02/11/2022 12:00   Portable Chest xray  Result Date: 02/14/2022 CLINICAL DATA:  Respiratory failure EXAM: PORTABLE CHEST 1 VIEW COMPARISON:  Chest radiograph 1 day prior FINDINGS: Endotracheal tube tip is approximately 2.8 cm from the carina. The enteric catheter has been removed. The cardiomediastinal silhouette is normal. There is no focal consolidation or pulmonary edema. There is no pleural effusion or pneumothorax. Overall, aeration is not significantly changed compared to the study from 1 day prior. The bones are stable. IMPRESSION: No focal consolidation or pleural effusion. Overall, no significant interval change since the study from 1 day prior. Electronically Signed   By: Lesia Hausen M.D.   On: 02/14/2022 08:10   DG Chest Port 1 View  Result Date: Feb 20, 2022 CLINICAL DATA:  Post intubation EXAM: PORTABLE CHEST 1 VIEW COMPARISON:  None. FINDINGS: Endotracheal tube tip is 3.2 cm above the carina. Enteric tube tip in the stomach. Cardiomediastinal silhouette within  normal limits. No focal consolidation, pleural effusion or pneumothorax. IMPRESSION: Medical devices as described.  No acute process identified. Electronically Signed  By: Jannifer Hickelaney  Williams M.D.   On: 02/11/2022 12:07   DG Abd Portable 1V  Result Date: 02/24/2022 CLINICAL DATA:  Enteric tube placement.  Recent TIPS. EXAM: PORTABLE ABDOMEN - 1 VIEW COMPARISON:  None. FINDINGS: Enteric tube in the stomach. The bowel gas pattern is normal. No radio-opaque calculi or other significant radiographic abnormality are seen. TIPS stent noted with multiple embolization coils in the mid upper abdomen. IMPRESSION: 1. Enteric tube in the stomach. Electronically Signed   By: Obie DredgeWilliam T Derry M.D.   On: 02/24/2022 11:56   ECHOCARDIOGRAM COMPLETE  Result Date: 02-07-2022    ECHOCARDIOGRAM REPORT   Patient Name:   Linde GillisMARTY L Pustejovsky Date of Exam: 02-07-2022 Medical Rec #:  [id number]        Height:       69.0 in Accession #:    [id number]       Weight:       228.6 lb Date of Birth:  08/23/1981         BSA:          2.187 m Patient Age:    41 years         BP:           155/75 mmHg Patient Gender: M                HR:           69 bpm. Exam Location:  Inpatient Procedure: 2D Echo, Cardiac Doppler and Color Doppler Indications:    PRE OP EVAL  History:        Patient has no prior history of Echocardiogram examinations.                 ETOH.  Sonographer:    Festus BarrenJeanine Russo Referring Phys: [id number] CARL E GESSNER IMPRESSIONS  1. Left ventricular ejection fraction, by estimation, is 65 to 70%. The left ventricle has normal function. The left ventricle has no regional wall motion abnormalities. Left ventricular diastolic parameters were normal.  2. Right ventricular systolic function is normal. The right ventricular size is normal. There is normal pulmonary artery systolic pressure.  3. The mitral valve is normal in structure. No evidence of mitral valve regurgitation. No evidence of mitral stenosis.  4. The aortic valve is tricuspid. Aortic  valve regurgitation is mild. Comparison(s): No prior Echocardiogram. FINDINGS  Left Ventricle: Left ventricular ejection fraction, by estimation, is 65 to 70%. The left ventricle has normal function. The left ventricle has no regional wall motion abnormalities. The left ventricular internal cavity size was normal in size. There is  no left ventricular hypertrophy. Left ventricular diastolic parameters were normal. Right Ventricle: The right ventricular size is normal. No increase in right ventricular wall thickness. Right ventricular systolic function is normal. There is normal pulmonary artery systolic pressure. The tricuspid regurgitant velocity is 2.51 m/s, and  with an assumed right atrial pressure of 3 mmHg, the estimated right ventricular systolic pressure is 28.2 mmHg. Left Atrium: Left atrial size was normal in size. Right Atrium: Right atrial size was normal in size. Pericardium: There is no evidence of pericardial effusion. Mitral Valve: The mitral valve is normal in structure. No evidence of mitral valve regurgitation. No evidence of mitral valve stenosis. Tricuspid Valve: The tricuspid valve is normal in structure. Tricuspid valve regurgitation is mild . No evidence of tricuspid stenosis. Aortic Valve: The aortic valve is tricuspid. Aortic valve regurgitation is mild. Aortic regurgitation PHT measures 558 msec. Aortic valve mean gradient measures  4.0 mmHg. Aortic valve peak gradient measures 8.1 mmHg. Aortic valve area, by VTI measures 3.31 cm. Pulmonic Valve: The pulmonic valve was normal in structure. Pulmonic valve regurgitation is not visualized. No evidence of pulmonic stenosis. Aorta: The aortic root and ascending aorta are structurally normal, with no evidence of dilitation. IAS/Shunts: No atrial level shunt detected by color flow Doppler.  LEFT VENTRICLE PLAX 2D LVIDd:         4.90 cm     Diastology LVIDs:         2.80 cm     LV e' medial:    9.01 cm/s LV PW:         0.90 cm     LV E/e' medial:   6.4 LV IVS:        0.90 cm     LV e' lateral:   10.60 cm/s LVOT diam:     2.20 cm     LV E/e' lateral: 5.5 LV SV:         97 LV SV Index:   44 LVOT Area:     3.80 cm  LV Volumes (MOD) LV vol d, MOD A2C: 79.9 ml LV vol d, MOD A4C: 78.9 ml LV vol s, MOD A2C: 21.7 ml LV vol s, MOD A4C: 20.8 ml LV SV MOD A2C:     58.2 ml LV SV MOD A4C:     78.9 ml LV SV MOD BP:      57.5 ml RIGHT VENTRICLE RV S prime:     19.30 cm/s RVOT diam:      2.40 cm TAPSE (M-mode): 2.7 cm LEFT ATRIUM             Index        RIGHT ATRIUM           Index LA diam:        3.80 cm 1.74 cm/m   RA Area:     15.60 cm LA Vol (A2C):   38.1 ml 17.42 ml/m  RA Volume:   35.70 ml  16.33 ml/m LA Vol (A4C):   26.3 ml 12.03 ml/m LA Biplane Vol: 32.4 ml 14.82 ml/m  AORTIC VALVE                    PULMONIC VALVE AV Area (Vmax):    3.21 cm     PV Vmax:       0.67 m/s AV Area (Vmean):   3.45 cm     PV Vmean:      42.200 cm/s AV Area (VTI):     3.31 cm     PV VTI:        0.185 m AV Vmax:           142.00 cm/s  PV Peak grad:  1.8 mmHg AV Vmean:          91.600 cm/s  PV Mean grad:  1.0 mmHg AV VTI:            0.292 m AV Peak Grad:      8.1 mmHg AV Mean Grad:      4.0 mmHg LVOT Vmax:         120.00 cm/s LVOT Vmean:        83.200 cm/s LVOT VTI:          0.254 m LVOT/AV VTI ratio: 0.87 AI PHT:            558 msec  AORTA Ao Root diam: 3.20 cm Ao  Asc diam:  3.00 cm MITRAL VALVE               TRICUSPID VALVE MV Area (PHT): 4.80 cm    TV Peak grad:   23.8 mmHg MV Decel Time: 158 msec    TV Mean grad:   16.0 mmHg MV E velocity: 57.80 cm/s  TV Vmax:        2.44 m/s MV A velocity: 64.70 cm/s  TV Vmean:       191.0 cm/s MV E/A ratio:  0.89        TV VTI:         0.86 msec                            TR Peak grad:   25.2 mmHg                            TR Vmax:        251.00 cm/s                             SHUNTS                            Systemic VTI:  0.25 m                            Systemic Diam: 2.20 cm                            Pulmonic Diam: 2.40 cm Riley Lam MD Electronically signed by Riley Lam MD Signature Date/Time: 02/21/2022/12:04:54 PM    Final    IR IVUS EACH ADDITIONAL NON CORONARY VESSEL  Result Date: 01/31/2022 CLINICAL DATA:  Cirrhosis, portal venous hypertension, GI bleed with esophageal varices post banding, incompletely controlled. Hemodynamic instability. EXAM: 1. TIPS CREATION 2. COIL EMBOLIZATION OF VENOUS VARICES 3. ULTRASOUND-GUIDED VENOUS ACCESS 4. PARACENTESIS WITH ULTRASOUND GUIDANCE ANESTHESIA/SEDATION: General - as administered by the Anesthesia department MEDICATIONS: Per anesthesia department FLUOROSCOPY TIME:  Radiation Exposure Index (as provided by the fluoroscopic device): 1360 mGy Kerma COMPLICATIONS: None immediate. PROCEDURE: Informed written consent was obtained from the family after a thorough discussion of the procedural risks, benefits and alternatives. All questions were addressed. See previous consultation. Maximal Sterile Barrier Technique was utilized including caps, mask, sterile gowns, sterile gloves, sterile drape, hand hygiene and skin antiseptic. A timeout was performed prior to the initiation of the procedure. The skin overlying the right upper abdominal quadrant as well as the right neck and femoral region were prepped and draped in usual sterile fashion. Initial ultrasound scanning demonstrates a moderate amount of recurrent  ascites. Under ultrasound guidance, a 6 [place] Safe-T-Centesis needle was advanced into the peritoneal cavity from a right lateral approach for paracentesis removing 3 L clear yellow ascites. Under direct ultrasound guidance, patency of the right common femoral vein was identified and documented. Micropuncture access under ultrasound guidance was achieved. Dilator exchanged over guidewire for 8 French vascular sheath, through which the ICE ultrasound probe was advanced into the central aspect of the IVC. Next, the right internal jugular vein patency was confirmed and  documented with ultrasound, then the vein was accessed under direct ultrasound guidance with micropuncture set. This allowed  for placement of the 10 French TIPS vascular sheath. 5 French angled angiographic catheter was advanced initially to the right hepatic vein and then into the middle hepatic vein under fluoroscopic and ultrasound guidance. Catheter exchanged over guidewire for a ScorpionT TIPS access set, advanced into the central aspect of right portal vein. Angled Glidewire was advanced into the portal vein. The access set was advanced sequentially into the portal vein. This was exchanged over guidewire for a pigtail measuring catheter. Portal venogram was performed demonstrating patency, slow antegrade flow through the main and right portal vein with limited opacification of the left portal vein. Large collaterals to esophageal varices were identified from the proximal portal vein. Pigtail catheter exchanged over guidewire. Tract was dilated with Conquest angioplasty balloon. The 10 French sheath was advanced into the portal vein. Next, a 2 cm (un covered) x 8 cm (covered) x 10 mm diameter GORE VIATORR TIPS Endoprosthesis was advanced through the sheath across the intra hepatic track and deployed. The TIPS stent was angioplastied in multiple stations to 10 mm diameter. Follow-up portal venogram demonstrated good stent deployment and flow, no residual stenosis. No further antegrade flow into the right left portal vein branches evident. Persistent opacification of large collateral channel supplying the esophageal varices. Attention then turned to variceal embolization. Dominant varices from the proximal portal vein and mid splenic vein were occluded with a total of 16 interlock and Ruby coils, to virtual cessation of flow through the channels. Final venogram from the peripheral splenic vein shows excellent antegrade flow through splenic and portal veins, no extravasation or other apparent complication.  Catheters and sheaths were removed from the patient.Hemostasis was achieved at the right femoral access site with manual compression. Hemostasis was achieved at the right IJ access site with manual compression. A dressing was placed. The patient tolerated the procedure well. Patient transferred to the PACU. IMPRESSION: 1. Technically successful TIPS creation. 2. Coil embolization of dominant splenic and portal collateral venous channels supplying esophageal varices. 3. Ultrasound guided paracentesis removing 3 L clear yellow ascites. Electronically Signed   By: Corlis Leak M.D.   On: 02/15/2022 09:07   IR EMBO VENOUS NOT HEMORR HEMANG  INC GUIDE ROADMAPPING  Result Date: 02/08/2022 CLINICAL DATA:  Cirrhosis, portal venous hypertension, GI bleed with esophageal varices post banding, incompletely controlled. Hemodynamic instability. EXAM: 1. TIPS CREATION 2. COIL EMBOLIZATION OF VENOUS VARICES 3. ULTRASOUND-GUIDED VENOUS ACCESS 4. PARACENTESIS WITH ULTRASOUND GUIDANCE ANESTHESIA/SEDATION: General - as administered by the Anesthesia department MEDICATIONS: Per anesthesia department FLUOROSCOPY TIME:  Radiation Exposure Index (as provided by the fluoroscopic device): 1360 mGy Kerma COMPLICATIONS: None immediate. PROCEDURE: Informed written consent was obtained from the family after a thorough discussion of the procedural risks, benefits and alternatives. All questions were addressed. See previous consultation. Maximal Sterile Barrier Technique was utilized including caps, mask, sterile gowns, sterile gloves, sterile drape, hand hygiene and skin antiseptic. A timeout was performed prior to the initiation of the procedure. The skin overlying the right upper abdominal quadrant as well as the right neck and femoral region were prepped and draped in usual sterile fashion. Initial ultrasound scanning demonstrates a moderate amount of recurrent  ascites. Under ultrasound guidance, a 6 [place] Safe-T-Centesis needle was  advanced into the peritoneal cavity from a right lateral approach for paracentesis removing 3 L clear yellow ascites. Under direct ultrasound guidance, patency of the right common femoral vein was identified and documented. Micropuncture access under ultrasound guidance was achieved. Dilator exchanged over guidewire for 8 [place] vascular  sheath, through which the ICE ultrasound probe was advanced into the central aspect of the IVC. Next, the right internal jugular vein patency was confirmed and documented with ultrasound, then the vein was accessed under direct ultrasound guidance with micropuncture set. This allowed for placement of the 10 French TIPS vascular sheath. 5 French angled angiographic catheter was advanced initially to the right hepatic vein and then into the middle hepatic vein under fluoroscopic and ultrasound guidance. Catheter exchanged over guidewire for a ScorpionT TIPS access set, advanced into the central aspect of right portal vein. Angled Glidewire was advanced into the portal vein. The access set was advanced sequentially into the portal vein. This was exchanged over guidewire for a pigtail measuring catheter. Portal venogram was performed demonstrating patency, slow antegrade flow through the main and right portal vein with limited opacification of the left portal vein. Large collaterals to esophageal varices were identified from the proximal portal vein. Pigtail catheter exchanged over guidewire. Tract was dilated with Conquest angioplasty balloon. The 10 French sheath was advanced into the portal vein. Next, a 2 cm (un covered) x 8 cm (covered) x 10 mm diameter GORE VIATORR TIPS Endoprosthesis was advanced through the sheath across the intra hepatic track and deployed. The TIPS stent was angioplastied in multiple stations to 10 mm diameter. Follow-up portal venogram demonstrated good stent deployment and flow, no residual stenosis. No further antegrade flow into the right left portal vein  branches evident. Persistent opacification of large collateral channel supplying the esophageal varices. Attention then turned to variceal embolization. Dominant varices from the proximal portal vein and mid splenic vein were occluded with a total of 16 interlock and Ruby coils, to virtual cessation of flow through the channels. Final venogram from the peripheral splenic vein shows excellent antegrade flow through splenic and portal veins, no extravasation or other apparent complication. Catheters and sheaths were removed from the patient.Hemostasis was achieved at the right femoral access site with manual compression. Hemostasis was achieved at the right IJ access site with manual compression. A dressing was placed. The patient tolerated the procedure well. Patient transferred to the PACU. IMPRESSION: 1. Technically successful TIPS creation. 2. Coil embolization of dominant splenic and portal collateral venous channels supplying esophageal varices. 3. Ultrasound guided paracentesis removing 3 L clear yellow ascites. Electronically Signed   By: Corlis Leak M.D.   On: 02/16/2022 09:07   CT Angio Abd/Pel w/ and/or w/o  Result Date: 01/31/2022 CLINICAL DATA:  GI bleed. EXAM: CTA ABDOMEN AND PELVIS WITHOUT AND WITH CONTRAST TECHNIQUE: Multidetector CT imaging of the abdomen and pelvis was performed using the standard protocol during bolus administration of intravenous contrast. Multiplanar reconstructed images and MIPs were obtained and reviewed to evaluate the vascular anatomy. RADIATION DOSE REDUCTION: This exam was performed according to the departmental dose-optimization program which includes automated exposure control, adjustment of the mA and/or kV according to patient size and/or use of iterative reconstruction technique. CONTRAST:  OMNIPAQUE IOHEXOL 350 MG/ML SOLN COMPARISON:  None. FINDINGS: VASCULAR Aorta: Age advanced atherosclerotic changes involving the aorta but no aneurysm dissection. Celiac: Of  separate origins of the hepatic and splenic arteries. Both vessels are patent. SMA: Normal Renals: Normal IMA: Patent Inflow: Age advanced atherosclerotic calcifications but no aneurysm or dissection or occlusion. Proximal Outflow: Patent Veins: Unremarkable. Review of the MIP images confirms the above findings. NON-VASCULAR Lower chest: Bilateral pleural effusions and overlying atelectasis. The heart is within normal limits in size. Moderate-sized hiatal hernia. Paraesophageal varices. Hepatobiliary: Cirrhotic changes involving  the liver but no arterial phase enhancing lesions to suggest hepatoma. No intrahepatic biliary dilatation. The gallbladder is mildly distended and contains some layering sludge or gallstones. No common bile duct dilatation. Pancreas: No mass, inflammation or ductal dilatation. Spleen: Normal size. No focal lesions. The splenic vein is patent. Small focus of thrombus is noted in the portal vein but no occlusive clot. Adrenals/Urinary Tract: The adrenal glands are unremarkable. Left renal calculus and small scattered renal cysts but no worrisome renal lesions or hydronephrosis. The bladder contains a Foley catheter. Stomach/Bowel: The stomach is moderately distended. Do not see any evidence of active contrast extravasation to suggest hemorrhage. The duodenum, small bowel and colon are grossly normal. No evidence of active extravasation to account for an active GI bleed. No obstructive findings. Lymphatic: Upper abdominal lymph nodes typical with cirrhosis. No retroperitoneal mass or adenopathy. Reproductive: The prostate gland and seminal vesicles are unremarkable. Other: Large volume abdominal/pelvic ascites and diffuse and fairly marked body wall edema. Musculoskeletal: No significant bony findings. IMPRESSION: 1. Age advanced atherosclerotic changes involving the aorta and branch vessels. No aneurysm or dissection. 2. No evidence of active contrast extravasation to account for an active GI  bleed. 3. Advanced cirrhotic changes involving the liver with portal venous hypertension, portal venous collaterals, paraesophageal varices and large volume abdominal/pelvic ascites. 4. No worrisome arterial phase enhancing lesions to suggest hepatoma. 5. Bilateral pleural effusions and overlying atelectasis. 6. Small focus of thrombus in the portal vein but no occlusive clot. Aortic Atherosclerosis (ICD10-I70.0). Electronically Signed   By: Rudie Meyer M.D.   On: 02/07/2022 18:12   US Abdomen Limited RUQ (LIVER/GB)  Result Date: 01/28/2022 CLINICAL DATA:  Alcohol abuse.  Hyperbilirubinemia. EXAM: ULTRASOUND ABDOMEN LIMITED RIGHT UPPER QUADRANT COMPARISON:  None. FINDINGS: Gallbladder: Physiologically distended. No gallstones or wall thickening visualized. Could not assess for sonographic Murphy sign, patient is intubated. Common bile duct: Diameter: 5 mm, normal. Liver: Heterogeneous increased hepatic parenchymal echogenicity. There is mild capsular nodularity. Difficult to assess for focal liver lesion given the significant heterogeneous appearance of the liver. Portal vein is patent on color Doppler imaging. Flow within the portal vein may to and fro/bidirectional. Other: Varicosities are noted at the hepatic hilum. Moderate volume abdominopelvic ascites, greatest in the lower quadrants. IMPRESSION: 1. Cirrhotic hepatic morphology. Liver parenchyma is diffusely heterogeneous which limits assessment for focal lesion. 2. Bidirectional flow is suspected in the main portal vein with varicosities noted at the hepatic hilum. 3. Moderate abdominopelvic ascites. 4. No gallstones or biliary dilatation. Electronically Signed   By: Narda Rutherford M.D.   On: 02/05/2022 17:28   IR Paracentesis  Result Date: 02/01/2022 CLINICAL DATA:  Cirrhosis, portal venous hypertension, GI bleed with esophageal varices post banding, incompletely controlled. Hemodynamic instability. EXAM: 1. TIPS CREATION 2. COIL EMBOLIZATION OF  VENOUS VARICES 3. ULTRASOUND-GUIDED VENOUS ACCESS 4. PARACENTESIS WITH ULTRASOUND GUIDANCE ANESTHESIA/SEDATION: General - as administered by the Anesthesia department MEDICATIONS: Per anesthesia department FLUOROSCOPY TIME:  Radiation Exposure Index (as provided by the fluoroscopic device): 1360 mGy Kerma COMPLICATIONS: None immediate. PROCEDURE: Informed written consent was obtained from the family after a thorough discussion of the procedural risks, benefits and alternatives. All questions were addressed. See previous consultation. Maximal Sterile Barrier Technique was utilized including caps, mask, sterile gowns, sterile gloves, sterile drape, hand hygiene and skin antiseptic. A timeout was performed prior to the initiation of the procedure. The skin overlying the right upper abdominal quadrant as well as the right neck and femoral region were prepped and  draped in usual sterile fashion. Initial ultrasound scanning demonstrates a moderate amount of recurrent  ascites. Under ultrasound guidance, a 6 [place] Safe-T-Centesis needle was advanced into the peritoneal cavity from a right lateral approach for paracentesis removing 3 L clear yellow ascites. Under direct ultrasound guidance, patency of the right common femoral vein was identified and documented. Micropuncture access under ultrasound guidance was achieved. Dilator exchanged over guidewire for 8 French vascular sheath, through which the ICE ultrasound probe was advanced into the central aspect of the IVC. Next, the right internal jugular vein patency was confirmed and documented with ultrasound, then the vein was accessed under direct ultrasound guidance with micropuncture set. This allowed for placement of the 10 French TIPS vascular sheath. 5 French angled angiographic catheter was advanced initially to the right hepatic vein and then into the middle hepatic vein under fluoroscopic and ultrasound guidance. Catheter exchanged over guidewire for a ScorpionT  TIPS access set, advanced into the central aspect of right portal vein. Angled Glidewire was advanced into the portal vein. The access set was advanced sequentially into the portal vein. This was exchanged over guidewire for a pigtail measuring catheter. Portal venogram was performed demonstrating patency, slow antegrade flow through the main and right portal vein with limited opacification of the left portal vein. Large collaterals to esophageal varices were identified from the proximal portal vein. Pigtail catheter exchanged over guidewire. Tract was dilated with Conquest angioplasty balloon. The 10 French sheath was advanced into the portal vein. Next, a 2 cm (un covered) x 8 cm (covered) x 10 mm diameter GORE VIATORR TIPS Endoprosthesis was advanced through the sheath across the intra hepatic track and deployed. The TIPS stent was angioplastied in multiple stations to 10 mm diameter. Follow-up portal venogram demonstrated good stent deployment and flow, no residual stenosis. No further antegrade flow into the right left portal vein branches evident. Persistent opacification of large collateral channel supplying the esophageal varices. Attention then turned to variceal embolization. Dominant varices from the proximal portal vein and mid splenic vein were occluded with a total of 16 interlock and Ruby coils, to virtual cessation of flow through the channels. Final venogram from the peripheral splenic vein shows excellent antegrade flow through splenic and portal veins, no extravasation or other apparent complication. Catheters and sheaths were removed from the patient.Hemostasis was achieved at the right femoral access site with manual compression. Hemostasis was achieved at the right IJ access site with manual compression. A dressing was placed. The patient tolerated the procedure well. Patient transferred to the PACU. IMPRESSION: 1. Technically successful TIPS creation. 2. Coil embolization of dominant splenic  and portal collateral venous channels supplying esophageal varices. 3. Ultrasound guided paracentesis removing 3 L clear yellow ascites. Electronically Signed   By: Corlis Leak M.D.   On: Mar 03, 2022 09:07    Microbiology: No results found for this or any previous visit (from the past 240 hour(s)).  Time spent: 25 minutes  Signed: Lorin Glass, MD 02/23/2022

## 2022-03-26 DEATH — deceased

## 2022-11-13 IMAGING — DX DG CHEST 1V PORT
1 series · 1 of 1 positions shown · non-contrast
Comparison: 02/21/2022

CLINICAL DATA: Central line placement.

EXAM:
PORTABLE CHEST 1 VIEW

[chest]
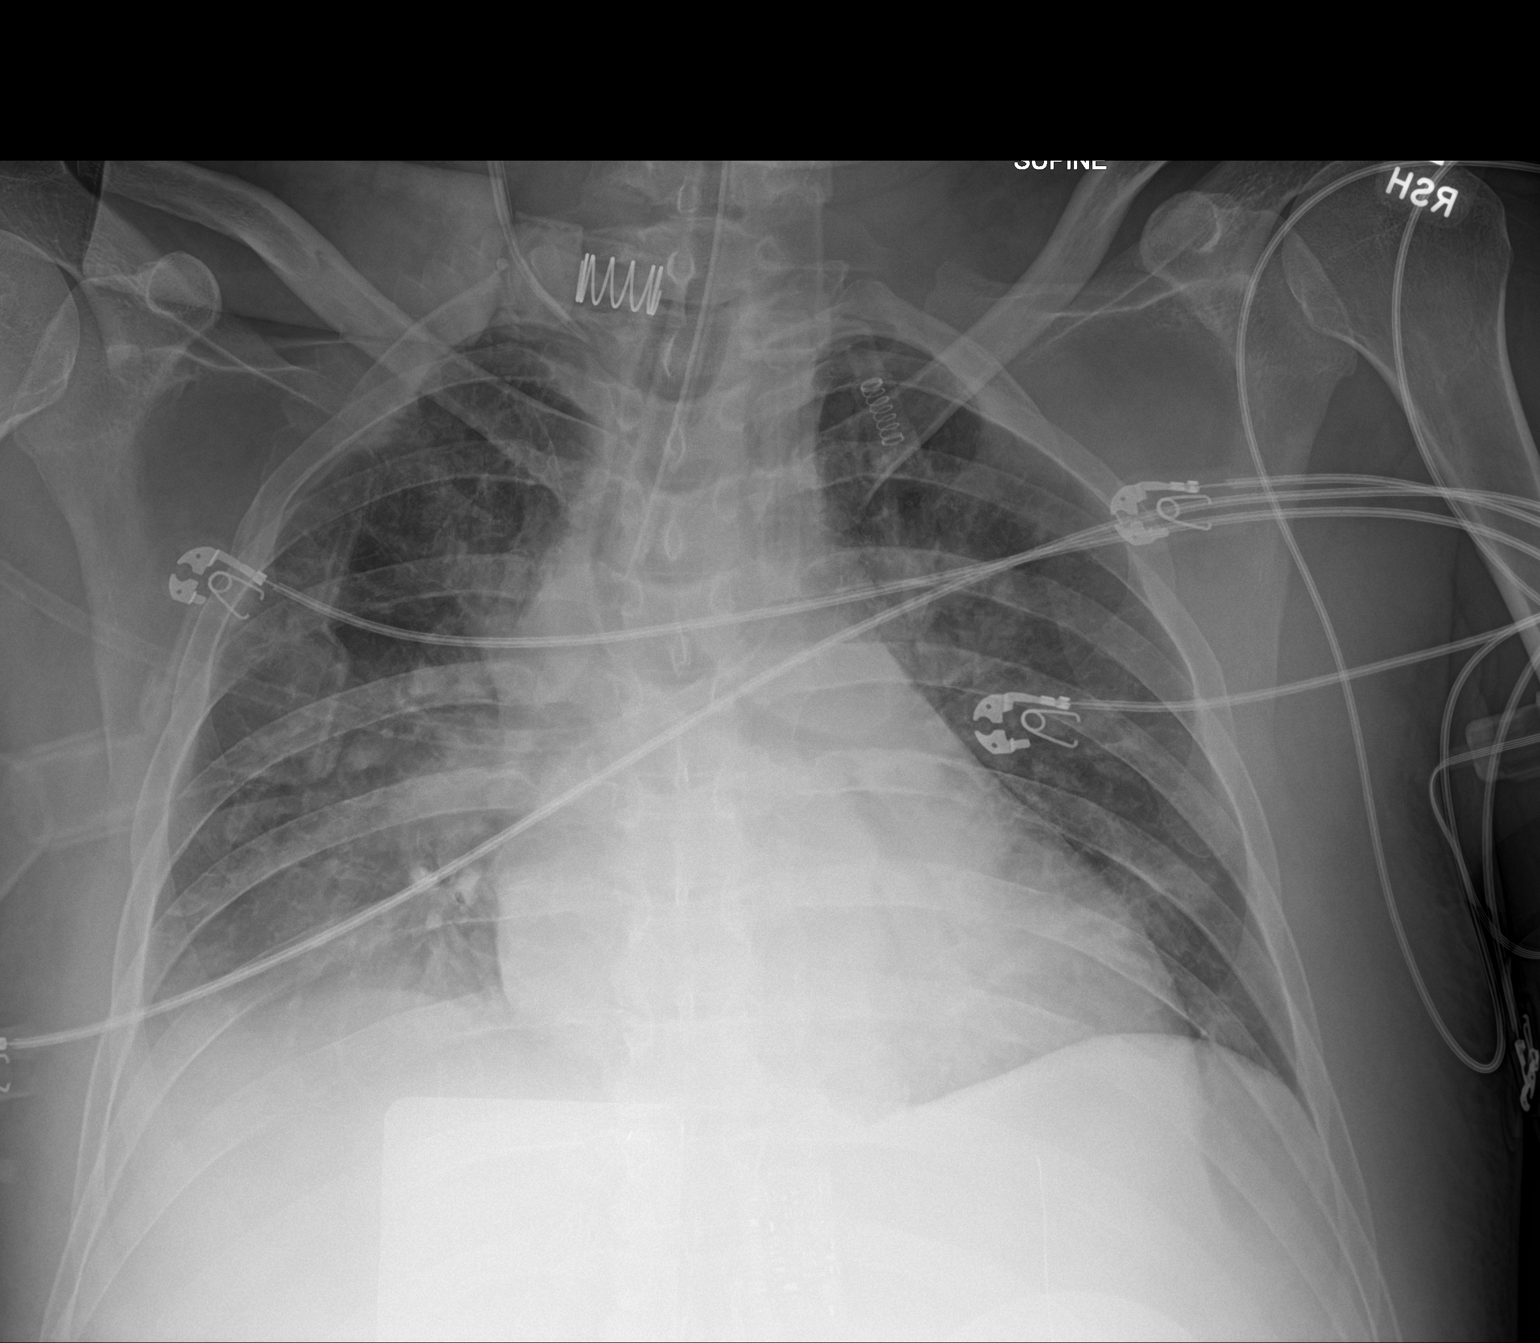

[1 of 1 positions shown; findings below may reference images not displayed]

FINDINGS: 0204 hours. Low volume film. The cardio pericardial silhouette is
enlarged. Bibasilar atelectasis/infiltrate noted. Endotracheal tube
tip is 2.8 cm above the base of the carina. Right IJ central line
tip overlies the right lung apex. No evidence for pneumothorax.
Telemetry leads overlie the chest.
IMPRESSION: 1. Right IJ central line tip overlies the right lung apex. No
pneumothorax.
2. Bibasilar atelectasis/infiltrate.

## 2022-11-15 IMAGING — DX DG ABD PORTABLE 1V
1 series · 1 of 1 positions shown · non-contrast
Comparison: None.

CLINICAL DATA: Enteric tube placement.  Recent TIPS.

EXAM:
PORTABLE ABDOMEN - 1 VIEW

[abdomen supine]
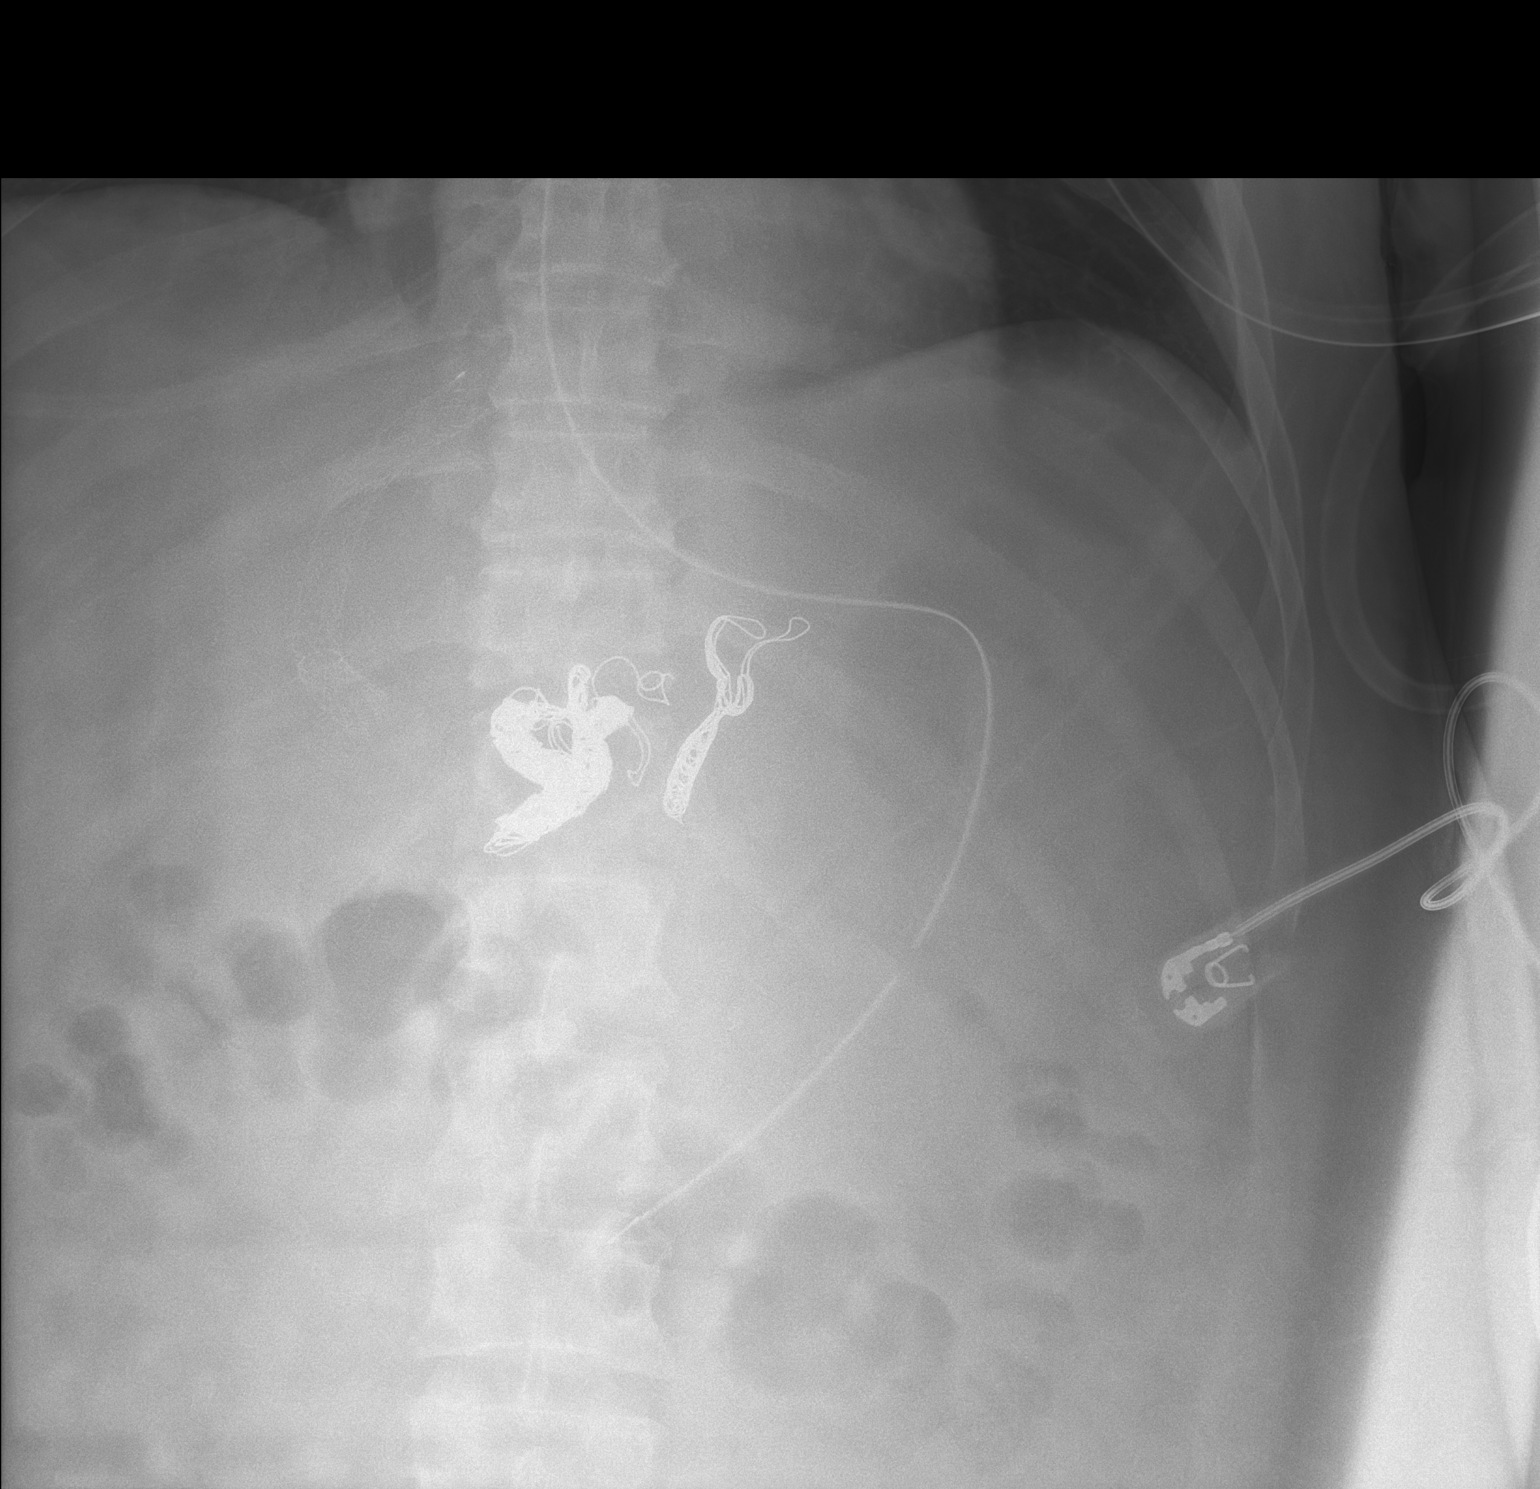

[1 of 1 positions shown; findings below may reference images not displayed]

FINDINGS: Enteric tube in the stomach. The bowel gas pattern is normal. No
radio-opaque calculi or other significant radiographic abnormality
are seen. TIPS stent noted with multiple embolization coils in the
mid upper abdomen.
IMPRESSION: 1. Enteric tube in the stomach.

## 2022-11-17 IMAGING — DX DG CHEST 1V PORT
1 series · 1 of 1 positions shown · non-contrast
Comparison: Portable chest 02/22/2022 and earlier.

CLINICAL DATA: 41-year-old male intubated. Upper GI bleed.

EXAM:
PORTABLE CHEST 1 VIEW

[chest ap]
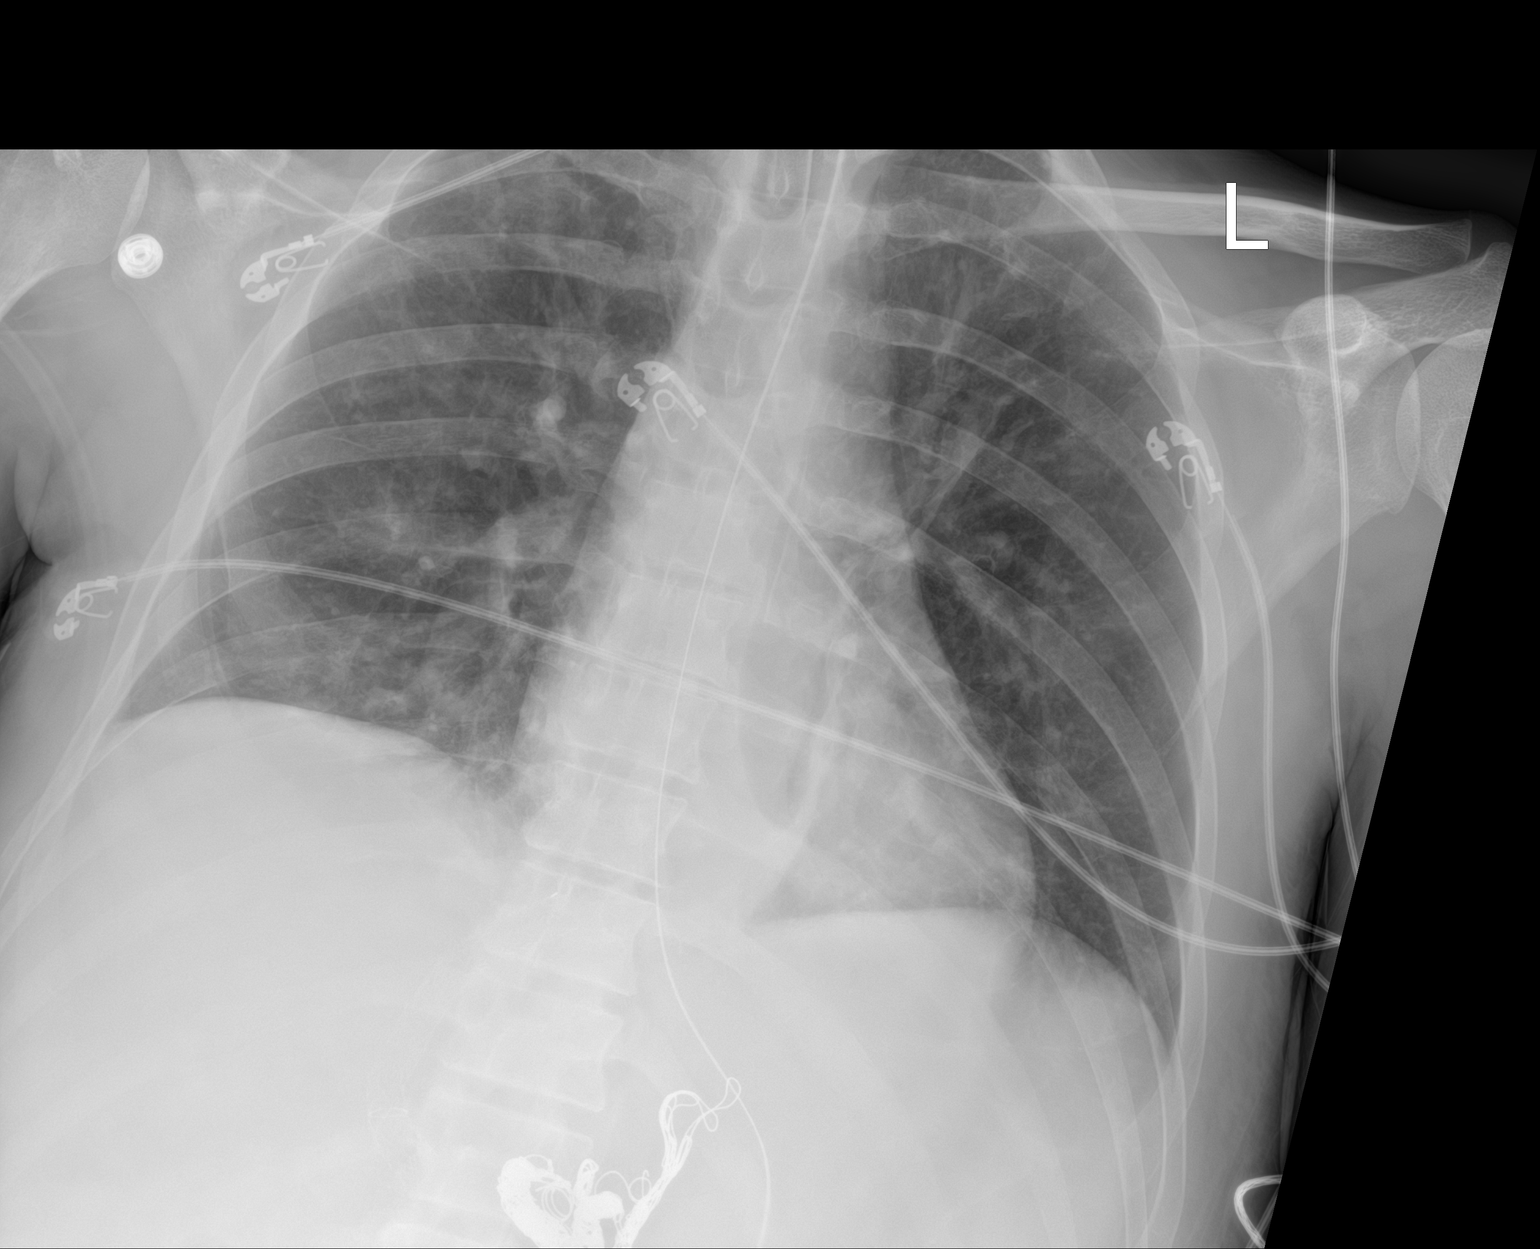

[1 of 1 positions shown; findings below may reference images not displayed]

FINDINGS: Portable AP semi upright view at 6119 hours. Right IJ central line
removed. Endotracheal tube tip is satisfactory at the level the
clavicles. Enteric tube courses to the abdomen, tip not included.
Extensive epigastric endovascular coils. Probable superimposed right
upper quadrant TIPS. Improved lung volumes. Normal cardiac size and
mediastinal contours. Streaky, platelike left lower lobe and right
perihilar opacity. Favor atelectasis. No pneumothorax, pulmonary
edema or pleural effusion. Paucity of bowel gas in the upper
abdomen.
IMPRESSION: 1. Removed duct right IJ central line. Satisfactory ET tube. Enteric
tube courses to the stomach, tip not included.
2. Improved lung volumes with streaky and plate-like bilateral
perihilar opacity, favor atelectasis.
3. Sequelae of TIPS, epigastric vascular embolization.
